# Patient Record
Sex: Female | Born: 1990 | Race: Black or African American | Hispanic: No | Marital: Married | Smoking: Current some day smoker
Health system: Southern US, Community
[De-identification: ages and names within clinical notes are randomized; demographics above are authoritative.]

## PROBLEM LIST (undated history)

## (undated) ENCOUNTER — Inpatient Hospital Stay (HOSPITAL_COMMUNITY): Payer: Self-pay

## (undated) DIAGNOSIS — N390 Urinary tract infection, site not specified: Secondary | ICD-10-CM

## (undated) DIAGNOSIS — B009 Herpesviral infection, unspecified: Secondary | ICD-10-CM

## (undated) DIAGNOSIS — N83209 Unspecified ovarian cyst, unspecified side: Secondary | ICD-10-CM

## (undated) DIAGNOSIS — R87619 Unspecified abnormal cytological findings in specimens from cervix uteri: Secondary | ICD-10-CM

## (undated) DIAGNOSIS — F32A Depression, unspecified: Secondary | ICD-10-CM

## (undated) DIAGNOSIS — IMO0002 Reserved for concepts with insufficient information to code with codable children: Secondary | ICD-10-CM

## (undated) DIAGNOSIS — N739 Female pelvic inflammatory disease, unspecified: Secondary | ICD-10-CM

## (undated) DIAGNOSIS — D649 Anemia, unspecified: Secondary | ICD-10-CM

## (undated) DIAGNOSIS — T7840XA Allergy, unspecified, initial encounter: Secondary | ICD-10-CM

## (undated) DIAGNOSIS — O0930 Supervision of pregnancy with insufficient antenatal care, unspecified trimester: Secondary | ICD-10-CM

## (undated) HISTORY — DX: Allergy, unspecified, initial encounter: T78.40XA

## (undated) HISTORY — DX: Supervision of pregnancy with insufficient antenatal care, unspecified trimester: O09.30

---
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
## 2008-12-03 ENCOUNTER — Emergency Department (HOSPITAL_COMMUNITY): Admission: EM | Admit: 2008-12-03 | Discharge: 2008-12-04 | Payer: Self-pay | Admitting: Emergency Medicine

## 2009-11-27 ENCOUNTER — Emergency Department (HOSPITAL_COMMUNITY): Admission: EM | Admit: 2009-11-27 | Discharge: 2009-11-27 | Payer: Self-pay | Admitting: Emergency Medicine

## 2010-01-23 ENCOUNTER — Emergency Department (HOSPITAL_COMMUNITY): Admission: EM | Admit: 2010-01-23 | Discharge: 2010-01-23 | Payer: Self-pay | Admitting: Emergency Medicine

## 2010-02-17 ENCOUNTER — Inpatient Hospital Stay (HOSPITAL_COMMUNITY): Admission: AD | Admit: 2010-02-17 | Discharge: 2010-02-17 | Payer: Self-pay | Admitting: Family Medicine

## 2010-02-17 ENCOUNTER — Ambulatory Visit: Payer: Self-pay | Admitting: Nurse Practitioner

## 2010-05-17 ENCOUNTER — Encounter: Payer: Self-pay | Admitting: Family Medicine

## 2010-07-08 LAB — URINALYSIS, ROUTINE W REFLEX MICROSCOPIC
Ketones, ur: NEGATIVE mg/dL
Leukocytes, UA: NEGATIVE
Protein, ur: NEGATIVE mg/dL
Specific Gravity, Urine: 1.015 (ref 1.005–1.030)

## 2010-07-08 LAB — CBC
HCT: 39.2 % (ref 36.0–46.0)
MCH: 28.2 pg (ref 26.0–34.0)
MCV: 84.6 fL (ref 78.0–100.0)
Platelets: 283 10*3/uL (ref 150–400)
RBC: 4.63 MIL/uL (ref 3.87–5.11)
WBC: 6.7 10*3/uL (ref 4.0–10.5)

## 2010-07-08 LAB — WET PREP, GENITAL
Trich, Wet Prep: NONE SEEN
Yeast Wet Prep HPF POC: NONE SEEN

## 2010-07-08 LAB — URINE MICROSCOPIC-ADD ON

## 2010-07-08 LAB — GC/CHLAMYDIA PROBE AMP, GENITAL: GC Probe Amp, Genital: NEGATIVE

## 2010-07-08 LAB — POCT PREGNANCY, URINE: Preg Test, Ur: NEGATIVE

## 2010-07-09 LAB — POCT I-STAT, CHEM 8
BUN: 8 mg/dL (ref 6–23)
Calcium, Ion: 1.18 mmol/L (ref 1.12–1.32)
Chloride: 107 mEq/L (ref 96–112)
Glucose, Bld: 95 mg/dL (ref 70–99)
TCO2: 24 mmol/L (ref 0–100)

## 2010-07-10 LAB — COMPREHENSIVE METABOLIC PANEL
ALT: 15 U/L (ref 0–35)
AST: 22 U/L (ref 0–37)
CO2: 22 mEq/L (ref 19–32)
Chloride: 107 mEq/L (ref 96–112)
Creatinine, Ser: 0.82 mg/dL (ref 0.4–1.2)
GFR calc Af Amer: >60 mL/min (ref >60)
GFR calc non Af Amer: >60 mL/min (ref >60)
Glucose, Bld: 78 mg/dL (ref 70–99)
Total Bilirubin: 0.6 mg/dL (ref 0.3–1.2)

## 2010-07-10 LAB — DIFFERENTIAL
Basophils Absolute: 0.1 10*3/uL (ref 0.0–0.1)
Lymphs Abs: 2.6 10*3/uL (ref 0.7–4.0)
Monocytes Absolute: 0.7 10*3/uL (ref 0.1–1.0)
Monocytes Relative: 9 % (ref 3–12)
Neutrophils Relative %: 53 % (ref 43–77)

## 2010-07-10 LAB — URINALYSIS, ROUTINE W REFLEX MICROSCOPIC
Bilirubin Urine: NEGATIVE
Glucose, UA: NEGATIVE mg/dL
Ketones, ur: NEGATIVE mg/dL
Specific Gravity, Urine: 1.014 (ref 1.005–1.030)
pH: 6 (ref 5.0–8.0)

## 2010-07-10 LAB — URINE CULTURE: Colony Count: >=100000

## 2010-07-10 LAB — URINE MICROSCOPIC-ADD ON

## 2010-07-10 LAB — CBC
HCT: 44.1 % (ref 36.0–46.0)
Hemoglobin: 15.1 g/dL — ABNORMAL HIGH (ref 12.0–15.0)
MCH: 27.3 pg (ref 26.0–34.0)
RBC: 5.54 MIL/uL — ABNORMAL HIGH (ref 3.87–5.11)

## 2010-07-10 LAB — LIPASE, BLOOD: Lipase: 36 U/L (ref 11–59)

## 2010-07-10 LAB — POCT PREGNANCY, URINE: Preg Test, Ur: NEGATIVE

## 2010-07-15 ENCOUNTER — Inpatient Hospital Stay (HOSPITAL_COMMUNITY): Payer: Self-pay

## 2010-07-15 ENCOUNTER — Inpatient Hospital Stay (HOSPITAL_COMMUNITY)
Admission: EM | Admit: 2010-07-15 | Discharge: 2010-07-15 | Disposition: A | Discharge status: Home / Self Care | Payer: Self-pay | Source: Ambulatory Visit | Attending: Obstetrics & Gynecology | Admitting: Obstetrics & Gynecology

## 2010-07-15 DIAGNOSIS — R109 Unspecified abdominal pain: Secondary | ICD-10-CM | POA: Insufficient documentation

## 2010-07-15 DIAGNOSIS — N760 Acute vaginitis: Secondary | ICD-10-CM | POA: Insufficient documentation

## 2010-07-15 DIAGNOSIS — N949 Unspecified condition associated with female genital organs and menstrual cycle: Secondary | ICD-10-CM | POA: Insufficient documentation

## 2010-07-15 DIAGNOSIS — B9689 Other specified bacterial agents as the cause of diseases classified elsewhere: Secondary | ICD-10-CM | POA: Insufficient documentation

## 2010-07-15 DIAGNOSIS — A499 Bacterial infection, unspecified: Secondary | ICD-10-CM

## 2010-07-15 LAB — CBC
HCT: 37.6 % (ref 36.0–46.0)
Hemoglobin: 12.6 g/dL (ref 12.0–15.0)
MCHC: 33.5 g/dL (ref 30.0–36.0)
RBC: 4.7 MIL/uL (ref 3.87–5.11)

## 2010-07-15 LAB — URINE MICROSCOPIC-ADD ON

## 2010-07-15 LAB — URINALYSIS, ROUTINE W REFLEX MICROSCOPIC
Bilirubin Urine: NEGATIVE
Glucose, UA: NEGATIVE mg/dL
Hgb urine dipstick: NEGATIVE
Protein, ur: NEGATIVE mg/dL
Urobilinogen, UA: 0.2 mg/dL (ref 0.0–1.0)

## 2010-07-15 LAB — WET PREP, GENITAL
Trich, Wet Prep: NONE SEEN
Yeast Wet Prep HPF POC: NONE SEEN

## 2010-07-15 LAB — DIFFERENTIAL
Basophils Absolute: 0.1 10*3/uL (ref 0.0–0.1)
Basophils Relative: 1 % (ref 0–1)
Lymphocytes Relative: 47 % — ABNORMAL HIGH (ref 12–46)
Monocytes Absolute: 0.6 10*3/uL (ref 0.1–1.0)
Monocytes Relative: 8 % (ref 3–12)
Neutro Abs: 2.8 10*3/uL (ref 1.7–7.7)
Neutrophils Relative %: 41 % — ABNORMAL LOW (ref 43–77)

## 2010-07-16 LAB — GC/CHLAMYDIA PROBE AMP, GENITAL
Chlamydia, DNA Probe: NEGATIVE
GC Probe Amp, Genital: POSITIVE — AB

## 2010-08-01 LAB — DIFFERENTIAL
Eosinophils Absolute: 0.3 10*3/uL (ref 0.0–0.7)
Lymphs Abs: 2 10*3/uL (ref 0.7–4.0)
Monocytes Absolute: 0.6 10*3/uL (ref 0.1–1.0)
Monocytes Relative: 8 % (ref 3–12)
Neutrophils Relative %: 61 % (ref 43–77)

## 2010-08-01 LAB — URINALYSIS, ROUTINE W REFLEX MICROSCOPIC
Bilirubin Urine: NEGATIVE
Hgb urine dipstick: NEGATIVE
Ketones, ur: NEGATIVE mg/dL
Nitrite: NEGATIVE
Protein, ur: NEGATIVE mg/dL
Specific Gravity, Urine: 1.013 (ref 1.005–1.030)
Urobilinogen, UA: 0.2 mg/dL (ref 0.0–1.0)

## 2010-08-01 LAB — BASIC METABOLIC PANEL
CO2: 28 mEq/L (ref 19–32)
Chloride: 108 mEq/L (ref 96–112)
Creatinine, Ser: 0.82 mg/dL (ref 0.4–1.2)
GFR calc Af Amer: >60 mL/min (ref >60)
Potassium: 4.1 mEq/L (ref 3.5–5.1)
Sodium: 141 mEq/L (ref 135–145)

## 2010-08-01 LAB — WET PREP, GENITAL
Clue Cells Wet Prep HPF POC: NONE SEEN
WBC, Wet Prep HPF POC: NONE SEEN
Yeast Wet Prep HPF POC: NONE SEEN

## 2010-08-01 LAB — CBC
Hemoglobin: 13.5 g/dL (ref 12.0–15.0)
MCHC: 33.3 g/dL (ref 30.0–36.0)
MCV: 84.1 fL (ref 78.0–100.0)
RBC: 4.82 MIL/uL (ref 3.87–5.11)
WBC: 7.5 10*3/uL (ref 4.0–10.5)

## 2010-08-01 LAB — POCT PREGNANCY, URINE: Preg Test, Ur: NEGATIVE

## 2010-08-01 LAB — URINE MICROSCOPIC-ADD ON

## 2010-08-01 LAB — GC/CHLAMYDIA PROBE AMP, GENITAL: Chlamydia, DNA Probe: NEGATIVE

## 2010-09-08 NOTE — H&P (Signed)
NAMEJONAH, NESTLE            ACCOUNT NO.:  [id number]   MEDICAL RECORD NO.:  [id number]          PATIENT TYPE:  MAT   LOCATION:  [facility]                          FACILITY:  [facility]   PHYSICIAN:  Lenoard Aden, M.D.DATE OF BIRTH:  [birth year]   DATE OF ADMISSION:  10/23/2007  DATE OF DISCHARGE:  10/23/2007                              HISTORY & PHYSICAL   CHIEF COMPLAINT:  Bleeding.   HISTORY OF PRESENT ILLNESS:  She is an 20 year old African American  female with a history of monochorionic diamniotic gestation documented  by ultrasound with sudden onset of bright red bleeding this evening.  She denies any tissue passage at this time.  She has no known drug  allergies.   ALLERGIES:  LATEX.   She has a history of UTI for which she did not treat but take her  Macrobid.   FAMILY HISTORY:  Diabetes and hypertension.   PREGNANCY HISTORY:  Noncontributory.   SOCIAL HISTORY:  She smokes a pack a day.  She denies domestic or  physical violence.   PHYSICAL EXAMINATION:  GENERAL:  She is a well-developed, well-nourished  [place] female in no acute distress.  HEENT:  Normal.  LUNGS:  Clear.  HEART:  Regular rate and rhythm.  ABDOMEN:  Soft and nontender.  PELVIC:  Reveals cervix to be closed and long.  No active bleeding  noted.  Uterus is enlarged.  It is about 8-10 weeks' size.  No adnexal  masses are noted.   Ultrasound revealed a 7-week intrauterine diamniotic monochorionic  pregnancy with a small subchorionic hematoma and fetal viability x2.   IMPRESSION:  Threatened abortion with viable twin pregnancy as  previously noted.  Blood type Rh +ve.   PLAN:  Discharge to home.  Follow up in the office as scheduled.  Pelvic  precautions given.      Lenoard Aden, M.D.  Electronically Signed    RJT/MEDQ  D:  10/23/2007  T:  10/24/2007  Job:  [id number]

## 2010-09-08 NOTE — Op Note (Signed)
Kristin Kemp, Kristin Kemp            ACCOUNT NO.:  [id number]   MEDICAL RECORD NO.:  [id number]          PATIENT TYPE:  AMB   LOCATION:  [facility]                           FACILITY:  [facility]   PHYSICIAN:  Randye Lobo, M.D.   DATE OF BIRTH:  [birth year]   DATE OF PROCEDURE:  11/22/2007  DATE OF DISCHARGE:                               OPERATIVE REPORT   PREOPERATIVE DIAGNOSIS:  Missed abortion with twin gestation at 25 plus  4 weeks.   POSTOPERATIVE DIAGNOSIS:  Missed abortion with twin gestation at 75 plus  4 weeks.   PROCEDURE:  Dilation and evacuation.   SURGEON:  Randye Lobo, MD   ANESTHESIA:  LMA, paracervical block with 1% lidocaine.   IV FLUIDS:  600 mL Ringer's lactate.   ESTIMATED BLOOD LOSS:  150 mL.   URINE OUTPUT:  50 mL.   COMPLICATIONS:  None.   INDICATIONS FOR PROCEDURE:  The patient is an 20 year old gravida 1,  para 0 African American female at 28 plus [redacted] weeks gestation who  presented to the office for a routine visit on November 21, 2007, at which  time, she was noted to have a missed abortion on ultrasound.  The  patient had previously received her care from another obstetric  practice, at which time, she had had an ultrasound documenting a viable  twin gestation dichorionic, diamniotic with an anticipated [facility] of  June 09, 2008.  Ultrasound performed in the office on November 21, 2007,  documented a missed abortion of both twins, which had a crown rump  length consistent each with 7 plus 5 weeks' gestation.  A discussion was  held with the patient regarding options for care and a plan was made to  proceed with a dilation and evacuation after risks, benefits, and  alternatives were reviewed.   FINDINGS:  Exam under anesthesia revealed a 9-week sized anteverted  mobile uterus.  No adnexal masses were appreciated.  A moderate amount  of products of conception were obtained.   SPECIMENS:  Products of conception were sent to pathology.   PROCEDURE:  The patient  was identified in the preoperative hold area.  She was taken to the operating room where latex allergy precautions were  taken.  The patient did receive an LMA anesthetic and she was then  placed in the dorsal lithotomy position.  The lower abdomen, vagina, and  perineum were sterilely prepped and draped and the bladder was  catheterized of urine.   An exam under anesthesia was performed.   A speculum was placed inside the vagina and a single-tooth tenaculum was  placed on the anterior cervical lip.  A paracervical block was performed  with a total of 10 mL of 1% lidocaine in the usual standard fashion.  The uterus was sounded to 9 cm.  The cervix was then dilated to a #29  Pratt dilator.  A #9 suction tip curette was then inserted through the  cervical os to the level of the uterine fundus and was withdrawn  slightly.  Proper suction was applied and the suction tip curette was  turned in a clockwise  fashion as it was removed from the uterine cavity.  This was repeated an additional two times.  There was some active  bleeding noted from within the uterus during the procedure.  A sharp  curettage was performed and there were no obvious remaining products of  conception appreciated.  The suction tip curette was introduced an  additional two final times to remove blood and clots from within the  uterine cavity.  Hemostasis was excellent at the end of the procedure.   All of the vaginal instruments were removed and the patient was taken  out of the dorsal lithotomy position.  She was escorted to the recovery  room in stable and awake condition.  There were no complications to the  procedure.  All needle, instrument, and sponge counts were correct.      Randye Lobo, M.D.  Electronically Signed     BES/MEDQ  D:  11/22/2007  T:  11/23/2007  Job:  [id number]

## 2010-10-24 ENCOUNTER — Emergency Department (HOSPITAL_COMMUNITY)
Admission: EM | Admit: 2010-10-24 | Discharge: 2010-10-24 | Disposition: A | Discharge status: Home / Self Care | Payer: Medicaid Other | Attending: Emergency Medicine | Admitting: Emergency Medicine

## 2010-10-24 DIAGNOSIS — L2989 Other pruritus: Secondary | ICD-10-CM | POA: Insufficient documentation

## 2010-10-24 DIAGNOSIS — L298 Other pruritus: Secondary | ICD-10-CM | POA: Insufficient documentation

## 2010-10-24 DIAGNOSIS — M25529 Pain in unspecified elbow: Secondary | ICD-10-CM | POA: Insufficient documentation

## 2010-10-24 DIAGNOSIS — IMO0002 Reserved for concepts with insufficient information to code with codable children: Secondary | ICD-10-CM | POA: Insufficient documentation

## 2010-10-24 DIAGNOSIS — M25429 Effusion, unspecified elbow: Secondary | ICD-10-CM | POA: Insufficient documentation

## 2010-10-27 ENCOUNTER — Inpatient Hospital Stay (HOSPITAL_COMMUNITY): Payer: Medicaid Other

## 2010-10-27 ENCOUNTER — Inpatient Hospital Stay (HOSPITAL_COMMUNITY)
Admission: AD | Admit: 2010-10-27 | Discharge: 2010-10-27 | Disposition: A | Discharge status: Home / Self Care | Payer: Medicaid Other | Source: Ambulatory Visit | Attending: Obstetrics and Gynecology | Admitting: Obstetrics and Gynecology

## 2010-10-27 DIAGNOSIS — O2390 Unspecified genitourinary tract infection in pregnancy, unspecified trimester: Secondary | ICD-10-CM | POA: Insufficient documentation

## 2010-10-27 DIAGNOSIS — A499 Bacterial infection, unspecified: Secondary | ICD-10-CM

## 2010-10-27 DIAGNOSIS — N760 Acute vaginitis: Secondary | ICD-10-CM | POA: Insufficient documentation

## 2010-10-27 DIAGNOSIS — R109 Unspecified abdominal pain: Secondary | ICD-10-CM

## 2010-10-27 DIAGNOSIS — R52 Pain, unspecified: Secondary | ICD-10-CM

## 2010-10-27 DIAGNOSIS — B9689 Other specified bacterial agents as the cause of diseases classified elsewhere: Secondary | ICD-10-CM | POA: Insufficient documentation

## 2010-10-27 LAB — URINALYSIS, ROUTINE W REFLEX MICROSCOPIC
Bilirubin Urine: NEGATIVE
Hgb urine dipstick: NEGATIVE
Nitrite: NEGATIVE
Protein, ur: NEGATIVE mg/dL
Urobilinogen, UA: 0.2 mg/dL (ref 0.0–1.0)

## 2010-10-27 LAB — CBC
MCH: 27.5 pg (ref 26.0–34.0)
MCV: 81.3 fL (ref 78.0–100.0)
Platelets: 273 10*3/uL (ref 150–400)
RDW: 14.3 % (ref 11.5–15.5)

## 2010-10-27 LAB — POCT PREGNANCY, URINE: Preg Test, Ur: POSITIVE

## 2010-10-27 LAB — WET PREP, GENITAL: Trich, Wet Prep: NONE SEEN

## 2010-11-04 ENCOUNTER — Encounter (HOSPITAL_COMMUNITY): Payer: Self-pay | Admitting: *Deleted

## 2010-11-04 ENCOUNTER — Inpatient Hospital Stay (HOSPITAL_COMMUNITY): Payer: Medicaid Other

## 2010-11-04 ENCOUNTER — Inpatient Hospital Stay (HOSPITAL_COMMUNITY)
Admission: AD | Admit: 2010-11-04 | Discharge: 2010-11-04 | Disposition: A | Discharge status: Home / Self Care | Payer: Medicaid Other | Source: Ambulatory Visit | Attending: Obstetrics & Gynecology | Admitting: Obstetrics & Gynecology

## 2010-11-04 DIAGNOSIS — O4690 Antepartum hemorrhage, unspecified, unspecified trimester: Secondary | ICD-10-CM

## 2010-11-04 DIAGNOSIS — O98219 Gonorrhea complicating pregnancy, unspecified trimester: Secondary | ICD-10-CM | POA: Insufficient documentation

## 2010-11-04 DIAGNOSIS — A5400 Gonococcal infection of lower genitourinary tract, unspecified: Secondary | ICD-10-CM | POA: Insufficient documentation

## 2010-11-04 DIAGNOSIS — A5403 Gonococcal cervicitis, unspecified: Secondary | ICD-10-CM

## 2010-11-04 DIAGNOSIS — O209 Hemorrhage in early pregnancy, unspecified: Secondary | ICD-10-CM

## 2010-11-04 LAB — CBC
HCT: 36 % (ref 36.0–46.0)
Hemoglobin: 12.4 g/dL (ref 12.0–15.0)
MCHC: 34.4 g/dL (ref 30.0–36.0)
MCV: 79.6 fL (ref 78.0–100.0)
RDW: 14 % (ref 11.5–15.5)
WBC: 9.6 10*3/uL (ref 4.0–10.5)

## 2010-11-04 MED ORDER — CEFTRIAXONE SODIUM 250 MG IJ SOLR
250.0000 mg | Freq: Once | INTRAMUSCULAR | Status: AC
  Administered 2010-11-04: 250 mg via INTRAMUSCULAR
  Filled 2010-11-04: qty 250

## 2010-11-04 MED ORDER — LIDOCAINE HCL 1 % IJ SOLN: 250.0000 mg | Freq: Once | INTRAMUSCULAR | Status: DC

## 2010-11-04 NOTE — ED Provider Notes (Signed)
History     Chief Complaint  Patient presents with  . Vaginal Bleeding   HPI 20 yo G2P0010 @ 6.[redacted] wks EGA. Reports onset of vaginal bleeding about 1 hour prior to arrival at [facility]U. Woke up feeling wet, went to bathroom and passed golf ball sized clot. Since then has noticed blood only with wiping. No pain. Was evaluated in MAU on 7/1 for cramping. Quant 5600 at that time, U/S showed IUGS with yolk sac, measured [redacted]w[redacted]d.   OB History    Grav Para Term Preterm Abortions TAB SAB Ect Mult Living   2    1  1   0 0      Past Medical History  Diagnosis Date  . No pertinent past medical history     Past Surgical History  Procedure Date  . Dilation and curettage of uterus 2009    Family History  Problem Relation Age of Onset  . Hypertension Maternal Grandmother   . Hyperlipidemia Maternal Grandmother     History  Substance Use Topics  . Smoking status: Former Games developer  . Smokeless tobacco: Not on file  . Alcohol Use: No    Allergies: No Known Allergies  No prescriptions prior to admission    Review of Systems  Constitutional: Negative.   Respiratory: Negative.   Cardiovascular: Negative.   Genitourinary:       + vaginal bleeding, negative cramping  Musculoskeletal: Negative.   Neurological: Negative.   Psychiatric/Behavioral: Negative.    Physical Exam   Blood pressure 121/67, pulse 85, temperature 98.6 F (37 C), temperature source Oral, resp. rate 18.  Physical Exam  Constitutional: She appears well-developed and well-nourished. She appears distressed (tearful).  Cardiovascular: Normal rate.   Respiratory: Effort normal.  GI: Soft. She exhibits no distension. There is no tenderness. There is no rebound and no guarding.  Genitourinary: There is no rash or lesion on the right labia. There is no rash or lesion on the left labia. There is bleeding around the vagina.       Cervix closed, small amount of blood in vagina     MAU Course  Procedures  Care transferred to  [facility], [facility].

## 2010-11-04 NOTE — Progress Notes (Signed)
N. Bascom Levels, CNM at bedside.  Sterile speculum exam done.  Small bleeding noted.

## 2010-11-04 NOTE — Progress Notes (Signed)
Pt sleeping waiting for u/s results.

## 2010-11-04 NOTE — ED Provider Notes (Signed)
History     Chief Complaint  Patient presents with  . Vaginal Bleeding   HPI  OB History    Grav Para Term Preterm Abortions TAB SAB Ect Mult Living   2    1  1   0 0      Past Medical History  Diagnosis Date  . No pertinent past medical history     Past Surgical History  Procedure Date  . Dilation and curettage of uterus 2009    Family History  Problem Relation Age of Onset  . Hypertension Maternal Grandmother   . Hyperlipidemia Maternal Grandmother     History  Substance Use Topics  . Smoking status: Former Games developer  . Smokeless tobacco: Not on file  . Alcohol Use: No    Allergies: No Known Allergies  No prescriptions prior to admission    ROS Physical Exam   Blood pressure 121/67, pulse 85, temperature 98.6 F (37 C), temperature source Oral, resp. rate 18.  Physical Exam  MAU Course  Procedures  Care transferred to me from [facility] CNM.   Pt H&P performed by CNM.  US shows single living IUP at 5.5wks. Small subchorionic hemorrhage noted.  Pt is Rh positive per note from 09/23/10.  A/P: 1) Vag bleeding in pregnancy: discussed with pt at length. Discussed diet, activity, risks, and precautions. She will f/u with her PCP. 2) Gonorrhea: discussed with pt at length. Will tx with Rocephin prior to dc. Discussed safe sex practices. Henrietta Hoover, PA 11/04/10 0822  Henrietta Hoover, PA 11/04/10 [phone]

## 2010-11-04 NOTE — Progress Notes (Signed)
DENIES MRSA 

## 2010-11-04 NOTE — Progress Notes (Signed)
PT SAYS SHE WAS HERE LAST Tuesday- TOLD HER SHE WAS PREG.  THEN  AT  0530  - PT HAD GOTTEN OFF WORK- WAS LAYING DOWN AND THOUGHT SHE HAD VOIDED ON SELF- BUT WENT TO B-ROOM- AND SAW RED BLOOD IN PANTIES AND PASSED RED CLOT INTO TOILET- GOLF BALL SIZE.Marland Kitchen  THEN WENT TO B-ROOM AGAIN- NO BLOOD.  THEN WENT AGAIN AND SAW SOME BLOOD WHEN WIPED.  SAYS NOW HAS TOILET PAPER IN PANTIES.   SAYS MIDDLE OF ABD HURTS

## 2010-11-04 NOTE — Initial Assessments (Signed)
Pt presents to mau for bleeding that started around 0530.  States she was seen earlier this week for cramping and found out she was pregnant.  States she had felt some cramping tonight before bleeding started.

## 2010-11-04 NOTE — ED Notes (Signed)
N. Frazier, CNM at bedside.  Assessment done and poc discussed with pt.  

## 2010-11-18 ENCOUNTER — Inpatient Hospital Stay (HOSPITAL_COMMUNITY)
Admission: AD | Admit: 2010-11-18 | Discharge: 2010-11-18 | Disposition: A | Discharge status: Home / Self Care | Payer: Medicaid Other | Source: Ambulatory Visit | Attending: Obstetrics and Gynecology | Admitting: Obstetrics and Gynecology

## 2010-11-18 ENCOUNTER — Encounter (HOSPITAL_COMMUNITY): Payer: Self-pay

## 2010-11-18 ENCOUNTER — Encounter (HOSPITAL_COMMUNITY): Payer: Self-pay | Admitting: *Deleted

## 2010-11-18 DIAGNOSIS — B3731 Acute candidiasis of vulva and vagina: Secondary | ICD-10-CM | POA: Insufficient documentation

## 2010-11-18 DIAGNOSIS — O2390 Unspecified genitourinary tract infection in pregnancy, unspecified trimester: Secondary | ICD-10-CM | POA: Insufficient documentation

## 2010-11-18 DIAGNOSIS — O209 Hemorrhage in early pregnancy, unspecified: Secondary | ICD-10-CM | POA: Insufficient documentation

## 2010-11-18 DIAGNOSIS — O4690 Antepartum hemorrhage, unspecified, unspecified trimester: Secondary | ICD-10-CM

## 2010-11-18 DIAGNOSIS — B373 Candidiasis of vulva and vagina: Secondary | ICD-10-CM

## 2010-11-18 LAB — WET PREP, GENITAL: Clue Cells Wet Prep HPF POC: NONE SEEN

## 2010-11-18 LAB — URINALYSIS, ROUTINE W REFLEX MICROSCOPIC
Glucose, UA: NEGATIVE mg/dL
Hgb urine dipstick: NEGATIVE
Leukocytes, UA: NEGATIVE
Protein, ur: NEGATIVE mg/dL
pH: 6 (ref 5.0–8.0)

## 2010-11-18 MED ORDER — FLUCONAZOLE 150 MG PO TABS: 150.0000 mg | ORAL_TABLET | Freq: Once | ORAL | Status: AC

## 2010-11-18 NOTE — Progress Notes (Signed)
Pt states spotting began 2 days ago, cramping began at same time. No recent intercourse. Pt states vaginal d/c "watery/clear, mixed with blood spots". Pt states lower abd cramping is bilateral, but felt moreso on left side.

## 2010-11-18 NOTE — ED Provider Notes (Addendum)
History    Patient is 20 year old black female. She is a gravida 2 para 0 Ab1. She's currently a weeks pregnant. She resents today complaining of vaginal spotting that began yesterday. She's had some mild abdominal cramping but nothing severe. She has not had intercourse since she was treated for gonorrhea. Denies vaginal irritation. She denies fever or any other problems.  Chief Complaint  Patient presents with  . Abdominal Cramping   HPI  OB History    Grav Para Term Preterm Abortions TAB SAB Ect Mult Living   2    1  1   0 0      Past Medical History  Diagnosis Date  . No pertinent past medical history     Past Surgical History  Procedure Date  . Dilation and curettage of uterus 2009    Family History  Problem Relation Age of Onset  . Hypertension Maternal Grandmother   . Hyperlipidemia Maternal Grandmother     History  Substance Use Topics  . Smoking status: Former Games developer  . Smokeless tobacco: Never Used  . Alcohol Use: No    Allergies: No Known Allergies  Prescriptions prior to admission  Medication Sig Dispense Refill  . promethazine (PHENERGAN) 25 MG tablet Take 25 mg by mouth every 6 (six) hours as needed. Take 1/2 to 1 tab every 6 hours as needed for nausea and vomiting         Review of Systems  Constitutional: Negative for fever and chills.  Eyes: Negative for blurred vision.  Cardiovascular: Negative for chest pain and palpitations.  Gastrointestinal: Positive for abdominal pain. Negative for nausea, vomiting, diarrhea and constipation.  Neurological: Negative for dizziness and headaches.  Psychiatric/Behavioral: Negative for depression and suicidal ideas.   Physical Exam   Blood pressure 114/62, pulse 93, temperature 99 F (37.2 C), temperature source Oral, resp. rate 16, height 5\' 4"  (1.626 m), weight 125 lb (56.7 kg), SpO2 99.00%.  Physical Exam  Constitutional: She is oriented to person, place, and time. She appears well-developed and  well-nourished. No distress.  HENT:  Head: Normocephalic and atraumatic.  Eyes: EOM are normal. Pupils are equal, round, and reactive to light.  GI: Soft. She exhibits no distension and no mass. There is no tenderness. There is no rebound and no guarding.  Genitourinary: Vaginal discharge found.       There is vaginal discharge present in the vaginal vault. It appears to be old blood. There is no active bleeding noted.  Neurological: She is alert and oriented to person, place, and time.  Skin: Skin is warm and dry. She is not diaphoretic.  Psychiatric: She has a normal mood and affect. Her behavior is normal. Judgment and thought content normal.    MAU Course  Procedures  Bedside ultrasound was performed. It demonstrated a single intrauterine pregnancy with good cardiac activity. No adenopathy is present at this time.   Results for orders placed during the hospital encounter of 11/18/10 (from the past 24 hour(s))  URINALYSIS, ROUTINE W REFLEX MICROSCOPIC     Status: Normal   Collection Time   11/18/10  3:30 PM      Component Value Range   Color, Urine YELLOW  YELLOW    Appearance CLEAR  CLEAR    Specific Gravity, Urine 1.020  1.005 - 1.030    pH 6.0  5.0 - 8.0    Glucose, UA NEGATIVE  NEGATIVE (mg/dL)   Hgb urine dipstick NEGATIVE  NEGATIVE    Bilirubin Urine NEGATIVE  NEGATIVE    Ketones, ur NEGATIVE  NEGATIVE (mg/dL)   Protein, ur NEGATIVE  NEGATIVE (mg/dL)   Urobilinogen, UA 0.2  0.0 - 1.0 (mg/dL)   Nitrite NEGATIVE  NEGATIVE    Leukocytes, UA NEGATIVE  NEGATIVE   WET PREP, GENITAL     Status: Abnormal   Collection Time   11/18/10  6:34 PM      Component Value Range   Yeast, Wet Prep MANY (*) NONE SEEN    Trich, Wet Prep NONE SEEN  NONE SEEN    Clue Cells, Wet Prep NONE SEEN  NONE SEEN    WBC, Wet Prep HPF POC FEW (*) NONE SEEN    Patient blood type is A+. Assessment and Plan  Vaginal bleeding and pregnancy: Did discuss this with the patient in length. I did give her  cautious. She was also noted to have a vaginal yeast infection. I will give her prescription for Diflucan. Followup scheduled with her OB provider. Did discuss with appropriate diet, activities, risks and precautions. She understood this and agreed.  Clinton Gallant. Rice III, DrHSc, MPAS, PA-C  11/18/2010, 6:28 PM

## 2010-11-18 NOTE — Progress Notes (Signed)
Pt states she was seen at MAU 2 weeks ago for pregnancy confirmation. Has been cramping for 2 days, with spotting with wiping and sharp abdominal pain since yesterday

## 2010-11-19 LAB — GC/CHLAMYDIA PROBE AMP, GENITAL: GC Probe Amp, Genital: NEGATIVE

## 2010-12-14 ENCOUNTER — Inpatient Hospital Stay (HOSPITAL_COMMUNITY)
Admission: AD | Admit: 2010-12-14 | Discharge: 2010-12-14 | Discharge status: Against Medical Advice | Payer: Self-pay | Source: Ambulatory Visit | Attending: Obstetrics & Gynecology | Admitting: Obstetrics & Gynecology

## 2010-12-14 DIAGNOSIS — O99891 Other specified diseases and conditions complicating pregnancy: Secondary | ICD-10-CM | POA: Insufficient documentation

## 2010-12-14 DIAGNOSIS — R51 Headache: Secondary | ICD-10-CM | POA: Insufficient documentation

## 2010-12-14 DIAGNOSIS — H538 Other visual disturbances: Secondary | ICD-10-CM | POA: Insufficient documentation

## 2010-12-14 LAB — URINALYSIS, ROUTINE W REFLEX MICROSCOPIC
Bilirubin Urine: NEGATIVE
Glucose, UA: NEGATIVE mg/dL
Hgb urine dipstick: NEGATIVE
Ketones, ur: NEGATIVE mg/dL
Protein, ur: NEGATIVE mg/dL
Urobilinogen, UA: 0.2 mg/dL (ref 0.0–1.0)

## 2010-12-14 NOTE — Progress Notes (Signed)
Throwing up Sat night, red spot in eye when woke up.  Has headache since and vision has been blurred.

## 2010-12-25 ENCOUNTER — Inpatient Hospital Stay (HOSPITAL_COMMUNITY)
Admission: AD | Admit: 2010-12-25 | Discharge: 2010-12-25 | Discharge status: Against Medical Advice | Payer: Medicaid Other | Source: Ambulatory Visit | Attending: Obstetrics and Gynecology | Admitting: Obstetrics and Gynecology

## 2010-12-25 DIAGNOSIS — J069 Acute upper respiratory infection, unspecified: Secondary | ICD-10-CM | POA: Insufficient documentation

## 2010-12-25 DIAGNOSIS — R05 Cough: Secondary | ICD-10-CM | POA: Insufficient documentation

## 2010-12-25 DIAGNOSIS — R059 Cough, unspecified: Secondary | ICD-10-CM | POA: Insufficient documentation

## 2010-12-25 NOTE — Progress Notes (Signed)
Pt states, " One Tuesday I started with a real bad cough and couldn't breath out of my nose. I am still coughing and spit up brown-yellow mucus."

## 2010-12-26 ENCOUNTER — Inpatient Hospital Stay (HOSPITAL_COMMUNITY)
Admission: AD | Admit: 2010-12-26 | Discharge: 2010-12-26 | Disposition: A | Discharge status: Home / Self Care | Payer: Self-pay | Source: Ambulatory Visit | Attending: Obstetrics & Gynecology | Admitting: Obstetrics & Gynecology

## 2010-12-26 DIAGNOSIS — J069 Acute upper respiratory infection, unspecified: Secondary | ICD-10-CM | POA: Insufficient documentation

## 2010-12-26 DIAGNOSIS — O99891 Other specified diseases and conditions complicating pregnancy: Secondary | ICD-10-CM | POA: Insufficient documentation

## 2010-12-26 NOTE — ED Provider Notes (Signed)
Attestation of Attending Supervision of Advanced Practitioner: Evaluation and management procedures were performed by the PA/NP/CNM/OB Fellow under my supervision/collaboration. Chart reviewed and agree with management and plan.  Victorine Mcnee A 12/26/2010 11:30 PM   

## 2010-12-26 NOTE — ED Provider Notes (Signed)
Chief Complaint:  Nasal Congestion   Kristin Kemp is  20 y.o. G2P0010.  No LMP recorded. Patient is pregnant..  Her pregnancy status is positive.  She presents complaining of Nasal Congestion . Onset is described as gradual and has been present for  4 days. Beginning to feel better compared with yesterday  OB History    Grav Para Term Preterm Abortions TAB SAB Ect Mult Living   2    1  1   0 0       Past Medical History  Diagnosis Date  . No pertinent past medical history     Past Surgical History  Procedure Date  . Dilation and curettage of uterus 2009    Family History  Problem Relation Age of Onset  . Hypertension Maternal Grandmother   . Hyperlipidemia Maternal Grandmother     History  Substance Use Topics  . Smoking status: Former Games developer  . Smokeless tobacco: Never Used  . Alcohol Use: No    Allergies: No Known Allergies  Prescriptions prior to admission  Medication Sig Dispense Refill  . Phenyleph-CPM-DM-APAP (TYLENOL COLD HEAD CONGESTION PO) Take 1 tablet by mouth as needed. For cold symptoms       . prenatal vitamin w/FE, FA (PRENATAL 1 + 1) 27-1 MG TABS Take 1 tablet by mouth daily.        . promethazine (PHENERGAN) 25 MG tablet Take 25 mg by mouth every 6 (six) hours as needed. Take 1/2 to 1 tab every 6 hours as needed for nausea and vomiting         Review of Systems - Negative except nasal and ear congestion,  Non-productive cough   Physical Exam   Blood pressure 113/70, pulse 81, temperature 98.6 F (37 C), temperature source Oral, resp. rate 18, height 5' 3.25" (1.607 m), weight 55.43 kg (122 lb 3.2 oz).  General: General appearance - alert, well appearing, and in no distress and sniffing, occ cough Chest - clear to auscultation, no wheezes, rales or rhonchi, symmetric air entry Heart - normal rate and regular rhythm Focused Gynecological Exam: examination not indicated  Labs: No results found for this or any previous visit (from the past  24 hour(s)). Imaging Studies:  No results found.   Assessment: Probable viral URI  Plan: If symptoms are not gone 10 days from onset, consider ABX  CRESENZO-DISHMAN,Tomoki Lucken

## 2010-12-26 NOTE — Progress Notes (Signed)
Onset of cold since Tuesday does not feel any better, has been in seen in MAU for this pregnancy, no prenatal care.

## 2010-12-30 NOTE — ED Provider Notes (Signed)
History     Chief Complaint  Patient presents with  . Cough  . URI   HPI    Past Medical History  Diagnosis Date  . No pertinent past medical history     Past Surgical History  Procedure Date  . Dilation and curettage of uterus 2009    Family History  Problem Relation Age of Onset  . Hypertension Maternal Grandmother   . Hyperlipidemia Maternal Grandmother     History  Substance Use Topics  . Smoking status: Former Games developer  . Smokeless tobacco: Never Used  . Alcohol Use: No    Allergies: No Known Allergies  No prescriptions prior to admission    ROS Physical Exam   Blood pressure 104/69, pulse 79, temperature 98.4 F (36.9 C), temperature source Oral, resp. rate 18, height 5' 2.75" (1.594 m), weight 55.963 kg (123 lb 6 oz).  Physical Exam  MAU Course  Procedures  MDM  Assessment and Plan  Patient left without being seen.  Wynelle Bourgeois 12/30/2010, 9:01 PM

## 2011-01-11 ENCOUNTER — Inpatient Hospital Stay (HOSPITAL_COMMUNITY)
Admission: AD | Admit: 2011-01-11 | Discharge: 2011-01-11 | Disposition: A | Discharge status: Home / Self Care | Payer: Medicaid Other | Source: Ambulatory Visit | Attending: Obstetrics & Gynecology | Admitting: Obstetrics & Gynecology

## 2011-01-11 DIAGNOSIS — O99891 Other specified diseases and conditions complicating pregnancy: Secondary | ICD-10-CM | POA: Insufficient documentation

## 2011-01-11 DIAGNOSIS — Z331 Pregnant state, incidental: Secondary | ICD-10-CM

## 2011-01-11 NOTE — ED Provider Notes (Signed)
Pt states that in her previous preganancy, she had had several ultrasounds by this gestation and thought she needed one.  She has an appt with her OB on Monday.  No c/o.  Pt informed that she should keep her appt with her OB Monday, as we do not do routine ultrasounds in the MAU.  Pt informed that if she has any problems or complaints, she should return.  Pt verbalized an understanding.

## 2011-01-11 NOTE — Progress Notes (Signed)
Pt states she has not had any prenatal care. Has an appointment on Monday at [facility]. Had an early dating ultrasound in MAU at 8 weeks. Is not having any issues but wants to have an ultrasound.

## 2011-01-13 NOTE — ED Provider Notes (Signed)
Agree with above note.  Nissa Stannard H. 01/13/2011 9:40 AM

## 2011-01-19 LAB — URINALYSIS, ROUTINE W REFLEX MICROSCOPIC
Bilirubin Urine: NEGATIVE
Glucose, UA: NEGATIVE
Hgb urine dipstick: NEGATIVE
Specific Gravity, Urine: 1.015
Urobilinogen, UA: 0.2
pH: 6

## 2011-01-19 LAB — GC/CHLAMYDIA PROBE AMP, GENITAL
Chlamydia, DNA Probe: NEGATIVE
GC Probe Amp, Genital: NEGATIVE

## 2011-01-19 LAB — WET PREP, GENITAL: Yeast Wet Prep HPF POC: NONE SEEN

## 2011-01-21 LAB — URINALYSIS, ROUTINE W REFLEX MICROSCOPIC
Bilirubin Urine: NEGATIVE
Ketones, ur: NEGATIVE
Specific Gravity, Urine: 1.015
Urobilinogen, UA: 0.2

## 2011-01-21 LAB — URINE MICROSCOPIC-ADD ON

## 2011-01-22 LAB — CBC
Hemoglobin: 13.1
MCV: 83.4
RBC: 4.74
WBC: 7.2

## 2011-02-11 LAB — RPR: RPR: NONREACTIVE

## 2011-02-11 LAB — HEPATITIS B SURFACE ANTIGEN: Hepatitis B Surface Ag: NEGATIVE

## 2011-02-11 LAB — GC/CHLAMYDIA PROBE AMP, GENITAL: Chlamydia: NEGATIVE

## 2011-02-20 ENCOUNTER — Inpatient Hospital Stay (HOSPITAL_COMMUNITY): Payer: Medicaid Other

## 2011-02-20 ENCOUNTER — Encounter (HOSPITAL_COMMUNITY): Payer: Self-pay | Admitting: *Deleted

## 2011-02-20 ENCOUNTER — Inpatient Hospital Stay (HOSPITAL_COMMUNITY)
Admission: AD | Admit: 2011-02-20 | Discharge: 2011-02-20 | Disposition: A | Discharge status: Home / Self Care | Payer: Medicaid Other | Source: Ambulatory Visit | Attending: Obstetrics and Gynecology | Admitting: Obstetrics and Gynecology

## 2011-02-20 DIAGNOSIS — O99891 Other specified diseases and conditions complicating pregnancy: Secondary | ICD-10-CM | POA: Insufficient documentation

## 2011-02-20 DIAGNOSIS — R1010 Upper abdominal pain, unspecified: Secondary | ICD-10-CM

## 2011-02-20 DIAGNOSIS — R109 Unspecified abdominal pain: Secondary | ICD-10-CM | POA: Insufficient documentation

## 2011-02-20 LAB — COMPREHENSIVE METABOLIC PANEL
ALT: 14 U/L (ref 0–35)
AST: 16 U/L (ref 0–37)
CO2: 23 mEq/L (ref 19–32)
Chloride: 104 mEq/L (ref 96–112)
GFR calc non Af Amer: >90 mL/min (ref >90)
Sodium: 135 mEq/L (ref 135–145)
Total Bilirubin: 0.3 mg/dL (ref 0.3–1.2)

## 2011-02-20 LAB — CBC
HCT: 30.4 % — ABNORMAL LOW (ref 36.0–46.0)
Platelets: 271 10*3/uL (ref 150–400)
RBC: 3.74 MIL/uL — ABNORMAL LOW (ref 3.87–5.11)
RDW: 14.5 % (ref 11.5–15.5)
WBC: 11.3 10*3/uL — ABNORMAL HIGH (ref 4.0–10.5)

## 2011-02-20 LAB — URINALYSIS, ROUTINE W REFLEX MICROSCOPIC
Glucose, UA: NEGATIVE mg/dL
Leukocytes, UA: NEGATIVE
Protein, ur: NEGATIVE mg/dL
Specific Gravity, Urine: 1.01 (ref 1.005–1.030)
pH: 6 (ref 5.0–8.0)

## 2011-02-20 LAB — DIFFERENTIAL
Basophils Absolute: 0 10*3/uL (ref 0.0–0.1)
Lymphocytes Relative: 21 % (ref 12–46)
Monocytes Absolute: 0.9 10*3/uL (ref 0.1–1.0)
Neutro Abs: 8 10*3/uL — ABNORMAL HIGH (ref 1.7–7.7)

## 2011-02-20 NOTE — ED Provider Notes (Signed)
History     Chief Complaint  Patient presents with  . Abdominal Pain   HPILatisha M Kemp.20 y.o. presents with upper abdominal pain. She is crying.  She is now [redacted]w[redacted]d gestation. She is a patient of .  She reports that she woke from a nap at 3:45 and the pain began.  This happened last week, instructed by her doctor to take Zantac pain resolved in 1 day.   States she ate a few chips and water at 4pm and took at Zantac at the same time.  States pain is comes and goes.  Denies nausea and vomiting, fever.       Past Medical History  Diagnosis Date  . No pertinent past medical history     Past Surgical History  Procedure Date  . Dilation and curettage of uterus 2009    Family History  Problem Relation Age of Onset  . Hypertension Maternal Grandmother   . Hyperlipidemia Maternal Grandmother     History  Substance Use Topics  . Smoking status: Former Smoker    Quit date: 10/21/2010  . Smokeless tobacco: Never Used  . Alcohol Use: No    Allergies: No Known Allergies  Prescriptions prior to admission  Medication Sig Dispense Refill  . Phenyleph-CPM-DM-APAP (TYLENOL COLD HEAD CONGESTION PO) Take 1 tablet by mouth as needed. For cold symptoms       . prenatal vitamin w/FE, FA (PRENATAL 1 + 1) 27-1 MG TABS Take 1 tablet by mouth daily.        . promethazine (PHENERGAN) 25 MG tablet Take 25 mg by mouth every 6 (six) hours as needed. Take 1/2 to 1 tab every 6 hours as needed for nausea and vomiting         Review of Systems  Constitutional: Negative for fever and chills.  Gastrointestinal: Positive for abdominal pain (upper mid-right abdominal pain). Negative for nausea and vomiting.   Physical Exam   Blood pressure 117/62, pulse 86, temperature 97.8 F (36.6 C), temperature source Oral, resp. rate 20, height 5\' 3"  (1.6 m).  Physical Exam  Constitutional: She is oriented to person, place, and time. She appears well-developed and well-nourished.       crying  HENT:  Head:  Normocephalic.  Respiratory: Effort normal.  GI: Soft. There is tenderness (mid-right abdominal tenderness). There is no rebound and no guarding.  Neurological: She is alert and oriented to person, place, and time.  Skin: Skin is warm and dry.    Results for orders placed during the hospital encounter of 02/20/11 (from the past 24 hour(s))  CBC     Status: Abnormal   Collection Time   02/20/11  5:37 PM      Component Value Range   WBC 11.3 (*) 4.0 - 10.5 (K/uL)   RBC 3.74 (*) 3.87 - 5.11 (MIL/uL)   Hemoglobin 10.5 (*) 12.0 - 15.0 (g/dL)   HCT 16.1 (*) 09.6 - 46.0 (%)   MCV 81.3  78.0 - 100.0 (fL)   MCH 28.1  26.0 - 34.0 (pg)   MCHC 34.5  30.0 - 36.0 (g/dL)   RDW 04.5  40.9 - 81.1 (%)   Platelets 271  150 - 400 (K/uL)  DIFFERENTIAL     Status: Abnormal   Collection Time   02/20/11  5:37 PM      Component Value Range   Neutrophils Relative 70  43 - 77 (%)   Neutro Abs 8.0 (*) 1.7 - 7.7 (K/uL)   Lymphocytes Relative 21  12 - 46 (%)   Lymphs Abs 2.3  0.7 - 4.0 (K/uL)   Monocytes Relative 8  3 - 12 (%)   Monocytes Absolute 0.9  0.1 - 1.0 (K/uL)   Eosinophils Relative 1  0 - 5 (%)   Eosinophils Absolute 0.1  0.0 - 0.7 (K/uL)   Basophils Relative 0  0 - 1 (%)   Basophils Absolute 0.0  0.0 - 0.1 (K/uL)  COMPREHENSIVE METABOLIC PANEL     Status: Abnormal   Collection Time   02/20/11  5:37 PM      Component Value Range   Sodium 135  135 - 145 (mEq/L)   Potassium 4.0  3.5 - 5.1 (mEq/L)   Chloride 104  96 - 112 (mEq/L)   CO2 23  19 - 32 (mEq/L)   Glucose, Bld 87  70 - 99 (mg/dL)   BUN 8  6 - 23 (mg/dL)   Creatinine, Ser 4.09  0.50 - 1.10 (mg/dL)   Calcium 9.9  8.4 - 81.1 (mg/dL)   Total Protein 6.5  6.0 - 8.3 (g/dL)   Albumin 3.3 (*) 3.5 - 5.2 (g/dL)   AST 16  0 - 37 (U/L)   ALT 14  0 - 35 (U/L)   Alkaline Phosphatase 41  39 - 117 (U/L)   Total Bilirubin 0.3  0.3 - 1.2 (mg/dL)   GFR calc non Af Amer >90  >90 (mL/min)   GFR calc Af Amer >90  >90 (mL/min)  AMYLASE     Status:  Normal   Collection Time   02/20/11  5:37 PM      Component Value Range   Amylase 72  0 - 105 (U/L)  LIPASE, BLOOD     Status: Normal   Collection Time   02/20/11  5:37 PM      Component Value Range   Lipase 31  11 - 59 (U/L)  URINALYSIS, ROUTINE W REFLEX MICROSCOPIC     Status: Normal   Collection Time   02/20/11  5:58 PM      Component Value Range   Color, Urine YELLOW  YELLOW    Appearance CLEAR  CLEAR    Specific Gravity, Urine 1.010  1.005 - 1.030    pH 6.0  5.0 - 8.0    Glucose, UA NEGATIVE  NEGATIVE (mg/dL)   Hgb urine dipstick NEGATIVE  NEGATIVE    Bilirubin Urine NEGATIVE  NEGATIVE    Ketones, ur NEGATIVE  NEGATIVE (mg/dL)   Protein, ur NEGATIVE  NEGATIVE (mg/dL)   Urobilinogen, UA 0.2  0.0 - 1.0 (mg/dL)   Nitrite NEGATIVE  NEGATIVE    Leukocytes, UA NEGATIVE  NEGATIVE         *RADIOLOGY REPORT*  Clinical Data: [redacted] weeks pregnant, RUQ abdominal pain  COMPLETE ABDOMINAL ULTRASOUND  Comparison: CT dated 12/04/2008  Findings:  Gallbladder: No gallstones, gallbladder wall thickening, or  pericholecystic fluid.  Common bile duct: Measures 2 mm.  Liver: No focal lesion identified. Within normal limits in  parenchymal echogenicity.  IVC: Appears normal.  Pancreas: Incompletely visualized but grossly unremarkable.  Spleen: Measures 5.6 cm.  Right Kidney: Measures 10.5 cm. Suspected 4 mm nonobstructing  right interpolar renal calculus. No hydronephrosis.  Left Kidney: Measures 11.0 cm. No mass or hydronephrosis.  Abdominal aorta: No aneurysm identified.  IMPRESSION:  Normal sonographic appearance of the gallbladder.  Suspected 4 mm nonobstructing right interpolar calculus. No  hydronephrosis.  Original Report Authenticated By: Charline Bills, M.D.  MAU Course  Procedures  MDM 17:30  Consulted with Dr. Ambrose Mantle regarding patients sxs /MSE.  Orders given for CBC/Diff, CMP, Amylase, Lipase and Gallbladder ultrasound.  I explained these orders to the  patient.    As ultrasound was taking patient, she reported she had burped several times and was feeling better.   19:15 Went in to evaluate patient after returning from ultrasound.  She is sitting up, smiling, stating she feels much better.  Reported lab and ultrasound results. 19:18  Reported lab and ultrasound results to Dr. Ambrose Mantle.  Patient may be discharged to home.  A:  Upper abdominal pain in second trimester pregnancy    Negative Gall bladder ultrasound  P:  Instructions given to follow up in the office.  Call if symptoms worsen.  She has Zantac at home for prn use.  Instructed to eat small frequent meals, avoid greasy, fried and spicy foods.  Mariette Cowley,EVE M 02/20/2011, 5:19 PM   Matt Holmes, NP 02/20/11 1920

## 2011-02-20 NOTE — Progress Notes (Signed)
Pt states, " After I woke up from a nap at 3:45 pm and had sharp pain all the was across my upper abdomen. The same thing happened one day last week, and my doctor told me to take something for heartburn. I took Zantac and I was still hurting 3-4 hrs latter."

## 2011-03-01 NOTE — ED Provider Notes (Signed)
Agree with above note.  [name] 03/01/2011 11:05 AM   

## 2011-04-27 NOTE — L&D Delivery Note (Signed)
Delivery Note At 6:12 PM a viable female was delivered via  (Presentation: LOA  APGAR: 7,9  Weight 6#8oz .   Placenta status:delivered spontaneously.  Cord:  with the following complications:tight nuchal delivered through .   Anesthesia:  Epidural  Episiotomy: n/a Lacerations: first degree Suture Repair: 3.0 vicryl rapide Est. Blood Loss (mL): 350cc  Mom to postpartum.  Baby to nursery-stable.  Oliver Pila 06/30/2011, 6:23 PM

## 2011-05-31 LAB — STREP B DNA PROBE: GBS: POSITIVE

## 2011-06-10 ENCOUNTER — Inpatient Hospital Stay (HOSPITAL_COMMUNITY)
Admission: AD | Admit: 2011-06-10 | Discharge: 2011-06-10 | Disposition: A | Discharge status: Home / Self Care | Payer: Medicaid Other | Source: Ambulatory Visit | Attending: Obstetrics and Gynecology | Admitting: Obstetrics and Gynecology

## 2011-06-10 ENCOUNTER — Encounter (HOSPITAL_COMMUNITY): Payer: Self-pay | Admitting: *Deleted

## 2011-06-10 DIAGNOSIS — IMO0002 Reserved for concepts with insufficient information to code with codable children: Secondary | ICD-10-CM | POA: Insufficient documentation

## 2011-06-10 DIAGNOSIS — O479 False labor, unspecified: Secondary | ICD-10-CM

## 2011-06-10 DIAGNOSIS — R609 Edema, unspecified: Secondary | ICD-10-CM

## 2011-06-10 DIAGNOSIS — M545 Low back pain, unspecified: Secondary | ICD-10-CM | POA: Insufficient documentation

## 2011-06-10 HISTORY — DX: Unspecified ovarian cyst, unspecified side: N83.209

## 2011-06-10 HISTORY — DX: Reserved for concepts with insufficient information to code with codable children: IMO0002

## 2011-06-10 HISTORY — DX: Herpesviral infection, unspecified: B00.9

## 2011-06-10 HISTORY — DX: Urinary tract infection, site not specified: N39.0

## 2011-06-10 HISTORY — DX: Unspecified abnormal cytological findings in specimens from cervix uteri: R87.619

## 2011-06-10 NOTE — ED Provider Notes (Signed)
History   Pt presents today c/o swelling and pain in her feet along with low back pain that has worsened since yesterday. She is currently 37.[redacted]wk pregnant. She denies abd pain, ctx, blurry vision, RUQ pain, or HA at this time. She reports GFM.  Chief Complaint  Patient presents with  . Back Pain   HPI  OB History    Grav Para Term Preterm Abortions TAB SAB Ect Mult Living   2    1  1   0 0      Past Medical History  Diagnosis Date  . Urinary tract infection   . Ovarian cyst   . Abnormal Pap smear   . Herpes     Past Surgical History  Procedure Date  . Dilation and curettage of uterus 2009    Family History  Problem Relation Age of Onset  . Hypertension Maternal Grandmother   . Hyperlipidemia Maternal Grandmother   . Anesthesia problems Neg Hx     History  Substance Use Topics  . Smoking status: Former Smoker    Quit date: 10/21/2010  . Smokeless tobacco: Never Used  . Alcohol Use: No    Allergies:  Allergies  Allergen Reactions  . Latex Hives    Prescriptions prior to admission  Medication Sig Dispense Refill  . Prenatal Vit-Fe Fumarate-FA (PRENATAL MULTIVITAMIN) TABS Take 1 tablet by mouth daily.      . valACYclovir (VALTREX) 500 MG tablet Take 500 mg by mouth daily.        Review of Systems  Constitutional: Negative for fever and chills.  Eyes: Negative for blurred vision and double vision.  Respiratory: Negative for cough, hemoptysis, sputum production, shortness of breath and wheezing.   Cardiovascular: Negative for chest pain and palpitations.  Gastrointestinal: Negative for nausea, vomiting, abdominal pain, diarrhea and constipation.  Genitourinary: Negative for dysuria, urgency, frequency and hematuria.  Musculoskeletal: Positive for back pain.  Neurological: Negative for dizziness and headaches.  Psychiatric/Behavioral: Negative for depression and suicidal ideas.   Physical Exam   Blood pressure 114/72, pulse 91, temperature 97.7 F (36.5  C), temperature source Oral, resp. rate 16, height 5' 2.5" (1.588 m), weight 167 lb 6.4 oz (75.932 kg), SpO2 100.00%.  Physical Exam  Nursing note and vitals reviewed. Constitutional: She is oriented to person, place, and time. She appears well-developed and well-nourished. No distress.  HENT:  Head: Normocephalic and atraumatic.  Eyes: EOM are normal. Pupils are equal, round, and reactive to light.  GI: Soft. She exhibits no distension. There is no tenderness. There is no rebound and no guarding.  Genitourinary: No bleeding around the vagina. No vaginal discharge found.       Cervix Cl/50/-3.  Musculoskeletal: She exhibits edema (1+ edema to lower extremities bilaterally.).  Neurological: She is alert and oriented to person, place, and time.  Skin: Skin is warm and dry. She is not diaphoretic.  Psychiatric: She has a normal mood and affect. Her behavior is normal. Judgment and thought content normal.    MAU Course  Procedures  NST reactive with irregular ctx.  Assessment and Plan  Edema/low back pain: discussed with pt at length. She has f/u scheduled with Dr. Jackelyn Knife tomorrow. Discussed diet, activity, risks, and precautions. Reminded of FCK. Discussed signs and sx of preeclampsia.  Clinton Gallant. Merlinda Wrubel III, DrHSc, MPAS, PA-C  06/10/2011, 9:53 AM   Henrietta Hoover, PA 06/10/11 1024

## 2011-06-10 NOTE — Progress Notes (Signed)
Patient states she started having mid back pain yesterday. States she has been having swelling in her feet but are worse today and very painful. Reports good fetal movement, no bleeding or leaking. No contractions.

## 2011-06-10 NOTE — Discharge Instructions (Signed)

## 2011-06-11 NOTE — Progress Notes (Signed)
FHT from 2-14 reviewed.  Reactive NST, irregular ctx.

## 2011-06-12 ENCOUNTER — Inpatient Hospital Stay (HOSPITAL_COMMUNITY)
Admission: AD | Admit: 2011-06-12 | Discharge: 2011-06-12 | Disposition: A | Discharge status: Home / Self Care | Payer: Medicaid Other | Source: Ambulatory Visit | Attending: Obstetrics and Gynecology | Admitting: Obstetrics and Gynecology

## 2011-06-12 DIAGNOSIS — O479 False labor, unspecified: Secondary | ICD-10-CM | POA: Insufficient documentation

## 2011-06-12 MED ORDER — ZOLPIDEM TARTRATE 10 MG PO TABS
10.0000 mg | ORAL_TABLET | Freq: Once | ORAL | Status: AC
  Administered 2011-06-12: 10 mg via ORAL
  Filled 2011-06-12: qty 1

## 2011-06-12 NOTE — Progress Notes (Signed)
"  I woke up with pain in my lower back and abd at about 0245.  I didn't time them, but they were coming closely and a lot.  No bleeding or leaking of clear fluid. (+) FM."

## 2011-06-12 NOTE — Progress Notes (Signed)
Pt states, " I woke up having contractions."

## 2011-06-13 ENCOUNTER — Encounter (HOSPITAL_COMMUNITY): Payer: Self-pay | Admitting: *Deleted

## 2011-06-13 ENCOUNTER — Inpatient Hospital Stay (HOSPITAL_COMMUNITY)
Admission: AD | Admit: 2011-06-13 | Discharge: 2011-06-14 | Disposition: A | Discharge status: Home / Self Care | Payer: Medicaid Other | Source: Ambulatory Visit | Attending: Obstetrics and Gynecology | Admitting: Obstetrics and Gynecology

## 2011-06-13 DIAGNOSIS — O479 False labor, unspecified: Secondary | ICD-10-CM | POA: Insufficient documentation

## 2011-06-13 NOTE — Progress Notes (Signed)
Pt presents to mau for labor eval.  States she has been having lower back pain all day along with head ache.  Has not taken anything for it.

## 2011-06-13 NOTE — Discharge Instructions (Signed)
Return to MAU with any worsening symptoms.   °

## 2011-06-13 NOTE — Progress Notes (Signed)
Dr. Ambrose Mantle notified of pt presenting for labor check with head ache and nausea. Notified of vital signs. Notified of VE 1.5/70/-3.  Notified of no bleeding or leaking.  Orders received to dc home.

## 2011-06-21 ENCOUNTER — Encounter (HOSPITAL_COMMUNITY): Payer: Self-pay | Admitting: *Deleted

## 2011-06-21 ENCOUNTER — Telehealth (HOSPITAL_COMMUNITY): Payer: Self-pay | Admitting: *Deleted

## 2011-06-21 NOTE — Telephone Encounter (Signed)
Preadmission screen  

## 2011-06-22 ENCOUNTER — Encounter (HOSPITAL_COMMUNITY): Payer: Self-pay | Admitting: *Deleted

## 2011-06-22 ENCOUNTER — Inpatient Hospital Stay (HOSPITAL_COMMUNITY)
Admission: AD | Admit: 2011-06-22 | Discharge: 2011-06-23 | Disposition: A | Discharge status: Home / Self Care | Payer: Medicaid Other | Source: Ambulatory Visit | Attending: Obstetrics and Gynecology | Admitting: Obstetrics and Gynecology

## 2011-06-22 DIAGNOSIS — O479 False labor, unspecified: Secondary | ICD-10-CM | POA: Insufficient documentation

## 2011-06-22 MED ORDER — LACTATED RINGERS IV BOLUS (SEPSIS)
1000.0000 mL | Freq: Once | INTRAVENOUS | Status: AC
  Administered 2011-06-22: 1000 mL via INTRAVENOUS

## 2011-06-22 NOTE — Progress Notes (Signed)
Dr Ellyn Hack notified of pt complaint, SVE, contraction pattern and b/p results. Will monitor x 1 hour and recheck

## 2011-06-23 MED ORDER — OXYCODONE-ACETAMINOPHEN 5-325 MG PO TABS
1.0000 | ORAL_TABLET | Freq: Once | ORAL | Status: AC
  Administered 2011-06-23: 1 via ORAL
  Filled 2011-06-23: qty 1

## 2011-06-29 ENCOUNTER — Other Ambulatory Visit: Payer: Self-pay | Admitting: Obstetrics and Gynecology

## 2011-06-30 ENCOUNTER — Encounter (HOSPITAL_COMMUNITY): Payer: Self-pay

## 2011-06-30 ENCOUNTER — Inpatient Hospital Stay (HOSPITAL_COMMUNITY)
Admission: RE | Admit: 2011-06-30 | Discharge: 2011-07-02 | DRG: 774 | Disposition: A | Discharge status: Home Health | Payer: Medicaid Other | Source: Ambulatory Visit | Attending: Obstetrics and Gynecology | Admitting: Obstetrics and Gynecology

## 2011-06-30 ENCOUNTER — Encounter (HOSPITAL_COMMUNITY): Payer: Self-pay | Admitting: Anesthesiology

## 2011-06-30 ENCOUNTER — Inpatient Hospital Stay (HOSPITAL_COMMUNITY): Payer: Medicaid Other | Admitting: Anesthesiology

## 2011-06-30 DIAGNOSIS — A630 Anogenital (venereal) warts: Secondary | ICD-10-CM | POA: Diagnosis present

## 2011-06-30 DIAGNOSIS — O99892 Other specified diseases and conditions complicating childbirth: Secondary | ICD-10-CM | POA: Diagnosis present

## 2011-06-30 DIAGNOSIS — O98519 Other viral diseases complicating pregnancy, unspecified trimester: Secondary | ICD-10-CM | POA: Diagnosis present

## 2011-06-30 DIAGNOSIS — Z22330 Carrier of Group B streptococcus: Secondary | ICD-10-CM

## 2011-06-30 LAB — CBC
HCT: 34.5 % — ABNORMAL LOW (ref 36.0–46.0)
Hemoglobin: 11.5 g/dL — ABNORMAL LOW (ref 12.0–15.0)
MCV: 81.2 fL (ref 78.0–100.0)
RDW: 14.7 % (ref 11.5–15.5)
WBC: 9 10*3/uL (ref 4.0–10.5)

## 2011-06-30 MED ORDER — ZOLPIDEM TARTRATE 5 MG PO TABS: 5.0000 mg | ORAL_TABLET | Freq: Every evening | ORAL | Status: DC | PRN

## 2011-06-30 MED ORDER — ONDANSETRON HCL 4 MG/2ML IJ SOLN: 4.0000 mg | Freq: Four times a day (QID) | INTRAMUSCULAR | Status: DC | PRN

## 2011-06-30 MED ORDER — LACTATED RINGERS IV SOLN
500.0000 mL | Freq: Once | INTRAVENOUS | Status: AC
  Administered 2011-06-30: 10:00:00 via INTRAVENOUS

## 2011-06-30 MED ORDER — TERBUTALINE SULFATE 1 MG/ML IJ SOLN: 0.2500 mg | Freq: Once | INTRAMUSCULAR | Status: DC | PRN

## 2011-06-30 MED ORDER — FENTANYL 2.5 MCG/ML BUPIVACAINE 1/10 % EPIDURAL INFUSION (WH - ANES)
14.0000 mL/h | INTRAMUSCULAR | Status: DC
  Administered 2011-06-30 (×3): 14 mL/h via EPIDURAL
  Filled 2011-06-30 (×3): qty 60

## 2011-06-30 MED ORDER — PRENATAL MULTIVITAMIN CH
1.0000 | ORAL_TABLET | Freq: Every day | ORAL | Status: DC
  Administered 2011-06-30 – 2011-07-02 (×3): 1 via ORAL
  Filled 2011-06-30 (×3): qty 1

## 2011-06-30 MED ORDER — OXYTOCIN 20 UNITS IN LACTATED RINGERS INFUSION - SIMPLE: 125.0000 mL/h | Freq: Once | INTRAVENOUS | Status: DC

## 2011-06-30 MED ORDER — ONDANSETRON HCL 4 MG PO TABS: 4.0000 mg | ORAL_TABLET | ORAL | Status: DC | PRN

## 2011-06-30 MED ORDER — LANOLIN HYDROUS EX OINT: TOPICAL_OINTMENT | CUTANEOUS | Status: DC | PRN

## 2011-06-30 MED ORDER — BUTORPHANOL TARTRATE 2 MG/ML IJ SOLN: 1.0000 mg | INTRAMUSCULAR | Status: DC | PRN

## 2011-06-30 MED ORDER — EPHEDRINE 5 MG/ML INJ: 10.0000 mg | INTRAVENOUS | Status: DC | PRN

## 2011-06-30 MED ORDER — IBUPROFEN 600 MG PO TABS
600.0000 mg | ORAL_TABLET | Freq: Four times a day (QID) | ORAL | Status: DC
  Administered 2011-07-01 – 2011-07-02 (×6): 600 mg via ORAL
  Filled 2011-06-30 (×6): qty 1

## 2011-06-30 MED ORDER — DIPHENHYDRAMINE HCL 25 MG PO CAPS: 25.0000 mg | ORAL_CAPSULE | Freq: Four times a day (QID) | ORAL | Status: DC | PRN

## 2011-06-30 MED ORDER — TETANUS-DIPHTH-ACELL PERTUSSIS 5-2.5-18.5 LF-MCG/0.5 IM SUSP
0.5000 mL | Freq: Once | INTRAMUSCULAR | Status: AC
  Administered 2011-07-01: 0.5 mL via INTRAMUSCULAR
  Filled 2011-06-30: qty 0.5

## 2011-06-30 MED ORDER — PENICILLIN G POTASSIUM 5000000 UNITS IJ SOLR
2.5000 10*6.[IU] | INTRAVENOUS | Status: DC
  Administered 2011-06-30 (×2): 2.5 10*6.[IU] via INTRAVENOUS
  Filled 2011-06-30 (×5): qty 2.5

## 2011-06-30 MED ORDER — WITCH HAZEL-GLYCERIN EX PADS: 1.0000 "application " | MEDICATED_PAD | CUTANEOUS | Status: DC | PRN

## 2011-06-30 MED ORDER — PHENYLEPHRINE 40 MCG/ML (10ML) SYRINGE FOR IV PUSH (FOR BLOOD PRESSURE SUPPORT): 80.0000 ug | PREFILLED_SYRINGE | INTRAVENOUS | Status: DC | PRN

## 2011-06-30 MED ORDER — ONDANSETRON HCL 4 MG/2ML IJ SOLN: 4.0000 mg | INTRAMUSCULAR | Status: DC | PRN

## 2011-06-30 MED ORDER — LACTATED RINGERS IV SOLN
500.0000 mL | INTRAVENOUS | Status: DC | PRN
Start: 2011-06-30 — End: 2011-06-30

## 2011-06-30 MED ORDER — OXYTOCIN BOLUS FROM INFUSION
500.0000 mL | Freq: Once | INTRAVENOUS | Status: DC
  Filled 2011-06-30: qty 500

## 2011-06-30 MED ORDER — DIBUCAINE 1 % RE OINT
1.0000 "application " | TOPICAL_OINTMENT | RECTAL | Status: DC | PRN
  Filled 2011-06-30: qty 28

## 2011-06-30 MED ORDER — SIMETHICONE 80 MG PO CHEW: 80.0000 mg | CHEWABLE_TABLET | ORAL | Status: DC | PRN

## 2011-06-30 MED ORDER — LIDOCAINE HCL (PF) 1 % IJ SOLN
INTRAMUSCULAR | Status: DC | PRN
Start: 2011-06-30 — End: 2011-06-30
  Administered 2011-06-30 (×3): 4 mL

## 2011-06-30 MED ORDER — SENNOSIDES-DOCUSATE SODIUM 8.6-50 MG PO TABS
2.0000 | ORAL_TABLET | Freq: Every day | ORAL | Status: DC
Start: 2011-06-30 — End: 2011-07-02
  Administered 2011-06-30: 2 via ORAL

## 2011-06-30 MED ORDER — CITRIC ACID-SODIUM CITRATE 334-500 MG/5ML PO SOLN: 30.0000 mL | ORAL | Status: DC | PRN

## 2011-06-30 MED ORDER — BENZOCAINE-MENTHOL 20-0.5 % EX AERO: 1.0000 "application " | INHALATION_SPRAY | CUTANEOUS | Status: DC | PRN

## 2011-06-30 MED ORDER — OXYTOCIN 20 UNITS IN LACTATED RINGERS INFUSION - SIMPLE
1.0000 m[IU]/min | INTRAVENOUS | Status: DC
  Administered 2011-06-30: 10 m[IU]/min via INTRAVENOUS
  Administered 2011-06-30: 2 m[IU]/min via INTRAVENOUS
  Filled 2011-06-30: qty 1000

## 2011-06-30 MED ORDER — OXYCODONE-ACETAMINOPHEN 5-325 MG PO TABS: 1.0000 | ORAL_TABLET | ORAL | Status: DC | PRN

## 2011-06-30 MED ORDER — OXYCODONE-ACETAMINOPHEN 5-325 MG PO TABS
1.0000 | ORAL_TABLET | ORAL | Status: DC | PRN
  Administered 2011-07-01 – 2011-07-02 (×5): 1 via ORAL
  Filled 2011-06-30 (×6): qty 1

## 2011-06-30 MED ORDER — ACETAMINOPHEN 325 MG PO TABS: 650.0000 mg | ORAL_TABLET | ORAL | Status: DC | PRN

## 2011-06-30 MED ORDER — FLEET ENEMA 7-19 GM/118ML RE ENEM: 1.0000 | ENEMA | RECTAL | Status: DC | PRN

## 2011-06-30 MED ORDER — LACTATED RINGERS IV SOLN
INTRAVENOUS | Status: DC
Start: 2011-06-30 — End: 2011-06-30
  Administered 2011-06-30 (×2): via INTRAVENOUS

## 2011-06-30 MED ORDER — LIDOCAINE HCL (PF) 1 % IJ SOLN
30.0000 mL | INTRAMUSCULAR | Status: DC | PRN
  Filled 2011-06-30: qty 30

## 2011-06-30 MED ORDER — PENICILLIN G POTASSIUM 5000000 UNITS IJ SOLR
5.0000 10*6.[IU] | Freq: Once | INTRAVENOUS | Status: AC
  Administered 2011-06-30: 5 10*6.[IU] via INTRAVENOUS
  Filled 2011-06-30: qty 5

## 2011-06-30 MED ORDER — IBUPROFEN 600 MG PO TABS
600.0000 mg | ORAL_TABLET | Freq: Four times a day (QID) | ORAL | Status: DC | PRN
  Administered 2011-06-30: 600 mg via ORAL
  Filled 2011-06-30: qty 1

## 2011-06-30 MED ORDER — DIPHENHYDRAMINE HCL 50 MG/ML IJ SOLN: 12.5000 mg | INTRAMUSCULAR | Status: DC | PRN

## 2011-06-30 NOTE — Anesthesia Procedure Notes (Signed)
Epidural Patient location during procedure: OB Start time: 06/30/2011 10:09 AM Reason for block: procedure for pain  Staffing Performed by: anesthesiologist   Preanesthetic Checklist Completed: patient identified, site marked, surgical consent, pre-op evaluation, timeout performed, IV checked, risks and benefits discussed and monitors and equipment checked  Epidural Patient position: sitting Prep: site prepped and draped and DuraPrep Patient monitoring: continuous pulse ox and blood pressure Approach: midline Injection technique: LOR air  Needle:  Needle type: Tuohy  Needle gauge: 17 G Needle length: 9 cm Needle insertion depth: 5 cm cm Catheter type: closed end flexible Catheter size: 19 Gauge Catheter at skin depth: 10 cm Test dose: negative  Assessment Events: blood not aspirated, injection not painful, no injection resistance, negative IV test and no paresthesia  Additional Notes Discussed risk of headache, infection, bleeding, nerve injury and failed or incomplete block.  Patient voices understanding and wishes to proceed.

## 2011-06-30 NOTE — Progress Notes (Signed)
   Subjective: Pt comfortable with epidural  Objective: BP 128/80  Pulse 71  Temp(Src) 98.4 F (36.9 C) (Oral)  Resp 16  Ht 5' 3.5" (1.613 m)  Wt 76.658 kg (169 lb)  BMI 29.47 kg/m2  SpO2 100%      FHT:  FHR: 140 bpm, variability: moderate,  accelerations:  Present,  decelerations:  Present mild variables UC:   regular, every 2-3 minutes SVE:   Dilation: 4 Effacement (%): 90 Station: -1 Exam by:: Isaiha Asare  Labs: Lab Results  Component Value Date   WBC 9.0 06/30/2011   HGB 11.5* 06/30/2011   HCT 34.5* 06/30/2011   MCV 81.2 06/30/2011   PLT 260 06/30/2011    Assessment / Plan: Pt making progress, IUPC placed to help with contraction monitoring and adjustment of pitocin. Oliver Pila 06/30/2011, 1:13 PM

## 2011-06-30 NOTE — Anesthesia Preprocedure Evaluation (Signed)

## 2011-06-30 NOTE — H&P (Signed)
Kristin Kemp is a 21 y.o. female G2P0010 at 40 weeks (ED 06/30/11 by LMP c/w 18 week [place]) presenting for induction given term.  Prenatal care complicated by +GBS status.  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  Some external condyloma.  No other issues.  History OB History    Grav Para Term Preterm Abortions TAB SAB Ect Mult Living   2    1  1   0 0    SAB x 1 2009  Past Medical History  Diagnosis Date  . Urinary tract infection   . Ovarian cyst   . Abnormal Pap smear   . Herpes   . Late prenatal care    Past Surgical History  Procedure Date  . Dilation and curettage of uterus 2009   Family History: family history includes Cancer in her paternal aunt and paternal uncle; Diabetes in her maternal grandfather and maternal uncle; Heart disease in her maternal grandmother; Hyperlipidemia in her maternal grandmother; Hypertension in her maternal grandmother and maternal uncle; and Kidney disease in her maternal uncle.  There is no history of Anesthesia problems. Social History:  reports that she quit smoking about 8 months ago. She has never used smokeless tobacco. She reports that she does not drink alcohol or use illicit drugs.  ROS  Dilation: 2 Effacement (%): 50 Station: -1 Exam by:: Kristin Kemp  AROM clear  Blood pressure 129/93, pulse 95, temperature 98.2 F (36.8 C), temperature source Oral, resp. rate 18, height 5' 3.5" (1.613 m), weight 76.658 kg (169 lb). Maternal Exam:  Uterine Assessment: Contraction strength is mild.  Contraction frequency is irregular.   Abdomen: Fetal presentation: vertex  Introitus: Normal vulva. 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    Physical Exam  Constitutional: She is oriented to person, place, and time. She appears well-developed and well-nourished.  Cardiovascular: Normal rate and regular rhythm.   Respiratory: Effort normal and breath sounds normal.  GI: Soft. Bowel sounds are normal.  Genitourinary: Vagina normal and  uterus normal.  Musculoskeletal: Normal range of motion.  Neurological: She is alert and oriented to person, place, and time.  Psychiatric: She has a normal mood and affect. Her behavior is normal.    Prenatal labs: ABO, Rh:  A positive Antibody: Negative (10/18 0000) Rubella: Immune (10/18 0000) RPR: Nonreactive (10/18 0000)  HBsAg: Negative (10/18 0000)  HIV: Non-reactive (10/18 0000)  GBS: Positive (02/04 0000)  One hour GTT 98 Quad screen negative Hgb AS  Assessment/Plan: Pt for induction of labor, plans epidural.  Already received PCN for +GBS.   Kristin Kemp W 06/30/2011, 10:00 AM

## 2011-07-01 LAB — CBC
HCT: 32.3 % — ABNORMAL LOW (ref 36.0–46.0)
MCH: 27.5 pg (ref 26.0–34.0)
MCV: 81.6 fL (ref 78.0–100.0)
Platelets: 221 10*3/uL (ref 150–400)
RDW: 14.6 % (ref 11.5–15.5)

## 2011-07-01 NOTE — Progress Notes (Signed)
Post Partum Day 1 Subjective: no complaints, up ad lib and tolerating PO  Objective: Blood pressure 122/86, pulse 89, temperature 98.4 F (36.9 C), temperature source Oral, resp. rate 18, height 5' 3.5" (1.613 m), weight 76.658 kg (169 lb), SpO2 99.00%, unknown if currently breastfeeding.  Physical Exam:  General: alert Lochia: appropriate Uterine Fundus: firm    Basename 07/01/11 0610 06/30/11 0640  HGB 10.9* 11.5*  HCT 32.3* 34.5*    Assessment/Plan: Plan for discharge tomorrow Plans Depo provera for birth control   LOS: 1 day   Justeen Hehr W 07/01/2011, 9:22 AM

## 2011-07-01 NOTE — Progress Notes (Signed)
UR chart review completed.  

## 2011-07-01 NOTE — Anesthesia Postprocedure Evaluation (Signed)
Anesthesia Post Note  Patient: Kristin Kemp  Procedure(s) Performed: * No procedures listed *  Anesthesia type: Epidural  Patient location: Mother/Baby  Post pain: Pain level controlled  Post assessment: Post-op Vital signs reviewed  Last Vitals:  Filed Vitals:   07/01/11 0506  BP: 122/86  Pulse: 89  Temp: 36.9 C  Resp: 18    Post vital signs: Reviewed  Level of consciousness:alert  Complications: No apparent anesthesia complications

## 2011-07-02 MED ORDER — IBUPROFEN 600 MG PO TABS: 600.0000 mg | ORAL_TABLET | Freq: Four times a day (QID) | ORAL | Status: AC

## 2011-07-02 NOTE — Discharge Summary (Signed)
[name], Kristin Kemp            ACCOUNT NO.:  [id number]  MEDICAL RECORD NO.:  [id number]  LOCATION:  9123                          FACILITY:  [facility]  PHYSICIAN:  Malachi Pro. Ambrose Mantle, M.D. DATE OF BIRTH:  [birth year]  DATE OF ADMISSION:  06/30/2011 DATE OF DISCHARGE:  07/02/2011                              DISCHARGE SUMMARY   HISTORY:  This is a 21 year old black female, para 0-0-1-0 gravida 2 at [redacted] weeks gestation with [facility] of June 30, 2011 compatible with an 8 week ultrasound, presented for induction of labor at term.  Prenatal care complicated by positive group B strep,  history of herpes on Valtrex since 36 weeks, some external condyloma but no other issues.  Blood group and type A positive, negative antibody, rubella immune, RPR nonreactive, hepatitis B surface antigen negative, HIV negative, group B strep positive, 1-hour Glucola 98, quad screen negative, hemoglobin AS. The patient was placed into labor with Pitocin.  An intrauterine pressure catheter was placed to help with contraction monitoring and adjustment of Pitocin. The patient progressed to full dilatation and delivered a living female infant, 6 pounds 8 ounces spontaneously, Apgars were 7 and 9 at 1 and 5 minutes. There was a tight nuchal cord. Anesthesia was epidural.  No episiotomy. First-degree lacerations repaired with 3-0 Vicryl.  Blood loss about 350 mL.  Postpartum, the patient did well.  She had no significant problems.  Initial hemoglobin was 11.5, hematocrit 34.5. Followup hemoglobin 10.9, hematocrit 32.3. The patient plan Depo-Provera for birth control. On the second postpartum day, she was afebrile.  Her blood pressure was slightly high to high normal. Her range was 116/80-137/92, and she is given instructions to watch her blood pressure and watch for any headache or epigastric pain.  Her RPR was nonreactive.  FINAL DIAGNOSES:  Intrauterine pregnancy at term, delivered LOA; operation spontaneous delivery  LOA; repair of first-degree laceration. Final condition improved.  Instructions include the regular discharge instruction booklet.  The patient is advised to watch her blood pressure and call with any significant headache or epigastric pain and call with blood pressures above 150/100. Otherwise she is to return in 6 weeks for followup examination.     Malachi Pro. Ambrose Mantle, M.D.     TFH/MEDQ  D:  07/02/2011  T:  07/02/2011  Job:  [id number]

## 2011-07-02 NOTE — Progress Notes (Signed)
Patient ID: Kristin Kemp, female   DOB: 20-Sep-1990, 21 y.o.   MRN: [id number] #2 afebrile BP as high as 137/92. For d/c.

## 2011-07-02 NOTE — Discharge Instructions (Signed)
booklet °

## 2011-07-20 ENCOUNTER — Encounter (HOSPITAL_COMMUNITY): Payer: Medicaid Other

## 2011-11-17 ENCOUNTER — Emergency Department (HOSPITAL_COMMUNITY)
Admission: EM | Admit: 2011-11-17 | Discharge: 2011-11-18 | Disposition: A | Discharge status: Home / Self Care | Payer: Medicaid Other | Attending: Emergency Medicine | Admitting: Emergency Medicine

## 2011-11-17 ENCOUNTER — Encounter (HOSPITAL_COMMUNITY): Payer: Self-pay | Admitting: *Deleted

## 2011-11-17 DIAGNOSIS — J40 Bronchitis, not specified as acute or chronic: Secondary | ICD-10-CM | POA: Insufficient documentation

## 2011-11-17 DIAGNOSIS — J9801 Acute bronchospasm: Secondary | ICD-10-CM

## 2011-11-17 DIAGNOSIS — F17200 Nicotine dependence, unspecified, uncomplicated: Secondary | ICD-10-CM | POA: Insufficient documentation

## 2011-11-17 MED ORDER — ALBUTEROL SULFATE HFA 108 (90 BASE) MCG/ACT IN AERS
2.0000 | INHALATION_SPRAY | RESPIRATORY_TRACT | Status: DC | PRN
  Administered 2011-11-18: 2 via RESPIRATORY_TRACT
  Filled 2011-11-17: qty 6.7

## 2011-11-17 NOTE — ED Notes (Signed)
Pt c/o sore throat, cough, nasal congestion since Sunday.  Does feel SOB tonight.

## 2011-11-17 NOTE — ED Notes (Signed)
RT called for albuterol inhaler.

## 2011-11-17 NOTE — ED Provider Notes (Signed)
History     CSN: [id number]  Arrival date & time 11/17/11  2306   None     Chief Complaint  Patient presents with  . Sore Throat  . Cough    (Consider location/radiation/quality/duration/timing/severity/associated sxs/prior treatment) HPI Comments: Patient with URI symptoms, sore throat, and cough.  Patient has been unaware of a fever until tonight when she developed chills.  She is most concerned about "wheezing" with her cough as she is not an asthmatic.  Taking over-the-counter medication, i.e., Alka-Seltzer plus, and Tylenol, cold and sinus for her symptoms without relief  Patient is a 21 y.o. female presenting with pharyngitis and cough.  Sore Throat This is a new problem. The current episode started yesterday. The problem occurs constantly. The problem has been gradually worsening. Associated symptoms include coughing, a fever and a sore throat. Nothing aggravates the symptoms. She has tried NSAIDs for the symptoms. The treatment provided no relief.  Cough Associated symptoms include rhinorrhea, sore throat and wheezing.    Past Medical History  Diagnosis Date  . Urinary tract infection   . Ovarian cyst   . Abnormal Pap smear   . Herpes   . Late prenatal care     Past Surgical History  Procedure Date  . Dilation and curettage of uterus 2009    Family History  Problem Relation Age of Onset  . Hypertension Maternal Grandmother   . Hyperlipidemia Maternal Grandmother   . Heart disease Maternal Grandmother     enlarged heart  . Anesthesia problems Neg Hx   . Diabetes Maternal Uncle   . Hypertension Maternal Uncle   . Kidney disease Maternal Uncle   . Cancer Paternal Aunt     lung  . Cancer Paternal Uncle     lung  . Diabetes Maternal Grandfather     History  Substance Use Topics  . Smoking status: Current Everyday Smoker -- 0.3 packs/day    Last Attempt to Quit: 10/21/2010  . Smokeless tobacco: Never Used  . Alcohol Use: Yes     occasionally    OB  History    Grav Para Term Preterm Abortions TAB SAB Ect Mult Living   2 1 1  1  1   0 1      Review of Systems  Constitutional: Positive for fever.  HENT: Positive for sore throat, rhinorrhea and postnasal drip.   Respiratory: Positive for cough and wheezing.     Allergies  Chocolate; Cinnamon; and Latex  Home Medications   Current Outpatient Rx  Name Route Sig Dispense Refill  . OVER THE COUNTER MEDICATION Oral Take 1 Wafer by mouth every 6 (six) hours as needed. Alka-Seltzer Plus For cold symptoms    . OVER THE COUNTER MEDICATION Oral Take 1 tablet by mouth every 6 (six) hours as needed. Tylenol Cold and Sinus for cold symptoms    . AZITHROMYCIN 250 MG PO TABS Oral Take 1 tablet (250 mg total) by mouth daily. 4 tablet 0    BP 121/91  Pulse 92  Temp 100.1 F (37.8 C) (Oral)  Resp 20  SpO2 97%  Physical Exam  Constitutional: She appears well-developed and well-nourished.  HENT:  Head: Normocephalic.  Mouth/Throat: Uvula is midline. Posterior oropharyngeal erythema present. No oropharyngeal exudate or posterior oropharyngeal edema.  Eyes: Pupils are equal, round, and reactive to light.  Cardiovascular: Normal rate and regular rhythm.   Pulmonary/Chest: Effort normal. No respiratory distress. She has wheezes. She exhibits no tenderness.  Neurological: She is alert.  Skin: Skin is warm. No rash noted.    ED Course  Procedures (including critical care time)   Labs Reviewed  RAPID STREP SCREEN   Dg Chest 2 View  11/18/2011  *RADIOLOGY REPORT*  Clinical Data: Cough, fever, wheezing  CHEST - 2 VIEW  Comparison: 12/18/2007  Findings: Lungs are clear. No pleural effusion or pneumothorax.  Cardiomediastinal silhouette is within normal limits.  Visualized osseous structures are within normal limits.  IMPRESSION: No evidence of acute cardiopulmonary disease.  Original Report Authenticated By: Charline Bills, M.D.     1. Bronchitis   2. Bronchospasm       MDM    We'll give albuterol inhaler in the department the nurse, has been instructed to teach the patient had to use this with an AeroChamber will obtain chest x-ray, and a rapid strep has already been ordered and obtained        Arman Filter, NP 11/18/11 0101

## 2011-11-18 ENCOUNTER — Emergency Department (HOSPITAL_COMMUNITY): Payer: Medicaid Other

## 2011-11-18 MED ORDER — AZITHROMYCIN 250 MG PO TABS
500.0000 mg | ORAL_TABLET | Freq: Once | ORAL | Status: AC
  Administered 2011-11-18: 500 mg via ORAL
  Filled 2011-11-18: qty 2

## 2011-11-18 MED ORDER — AZITHROMYCIN 250 MG PO TABS: 250.0000 mg | ORAL_TABLET | Freq: Every day | ORAL | Status: AC

## 2011-11-18 NOTE — ED Notes (Signed)
Patient transported to and from X-ray 

## 2011-11-19 NOTE — ED Provider Notes (Signed)
Medical screening examination/treatment/procedure(s) were performed by non-physician practitioner and as supervising physician I was immediately available for consultation/collaboration.    Risha Barretta D Adriann Thau, MD 11/19/11 1540 

## 2012-11-03 ENCOUNTER — Emergency Department (HOSPITAL_COMMUNITY)
Admission: EM | Admit: 2012-11-03 | Discharge: 2012-11-03 | Disposition: A | Discharge status: Home / Self Care | Payer: Medicaid Other | Attending: Emergency Medicine | Admitting: Emergency Medicine

## 2012-11-03 ENCOUNTER — Encounter (HOSPITAL_COMMUNITY): Payer: Self-pay | Admitting: *Deleted

## 2012-11-03 DIAGNOSIS — Z8619 Personal history of other infectious and parasitic diseases: Secondary | ICD-10-CM | POA: Insufficient documentation

## 2012-11-03 DIAGNOSIS — F17200 Nicotine dependence, unspecified, uncomplicated: Secondary | ICD-10-CM | POA: Insufficient documentation

## 2012-11-03 DIAGNOSIS — Z8742 Personal history of other diseases of the female genital tract: Secondary | ICD-10-CM | POA: Insufficient documentation

## 2012-11-03 DIAGNOSIS — Z91040 Latex allergy status: Secondary | ICD-10-CM | POA: Insufficient documentation

## 2012-11-03 DIAGNOSIS — Z87440 Personal history of urinary (tract) infections: Secondary | ICD-10-CM | POA: Insufficient documentation

## 2012-11-03 DIAGNOSIS — L509 Urticaria, unspecified: Secondary | ICD-10-CM

## 2012-11-03 DIAGNOSIS — T7840XA Allergy, unspecified, initial encounter: Secondary | ICD-10-CM

## 2012-11-03 MED ORDER — PREDNISONE 20 MG PO TABS
60.0000 mg | ORAL_TABLET | Freq: Once | ORAL | Status: AC
  Administered 2012-11-03: 60 mg via ORAL
  Filled 2012-11-03: qty 3

## 2012-11-03 NOTE — ED Provider Notes (Signed)
History  This chart was scribed for Kristin Octave, MD by Ardelia Mems, ED Scribe. This patient was seen in room TR08C/TR08C and the patient's care was started at 9:55 PM.  CSN: [id number]  Arrival date & time 11/03/12  2132   Chief Complaint  Patient presents with  . Allergic Reaction    The history is provided by the patient. No language interpreter was used.   HPI Comments: Kristin Kemp is a 22 y.o. female who presents to the Emergency Department complaining of a suspected allergic reaction to chocolate. 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 Pt states that ate chocolate 1 day ago, and she broke out into a rash on her stomach yesterday, which gradually has spread to her back. Pt denies using new soaps detergents, lotions, or being around new animals. Pt denies recent travel. Pt denies sick contacts, but works in proximity of others. Pt denies taking any new medications. Pt denies difficulty breathing or difficulty swallowing, or any other symptoms. Pt states that she took Benadryl with only mild relief today.   Past Medical History  Diagnosis Date  . Urinary tract infection   . Ovarian cyst   . Abnormal Pap smear   . Herpes   . Late prenatal care    Past Surgical History  Procedure Laterality Date  . Dilation and curettage of uterus  2009   Family History  Problem Relation Age of Onset  . Hypertension Maternal Grandmother   . Hyperlipidemia Maternal Grandmother   . Heart disease Maternal Grandmother     enlarged heart  . Anesthesia problems Neg Hx   . Diabetes Maternal Uncle   . Hypertension Maternal Uncle   . Kidney disease Maternal Uncle   . Cancer Paternal Aunt     lung  . Cancer Paternal Uncle     lung  . Diabetes Maternal Grandfather    History  Substance Use Topics  . Smoking status: Current Every Day Smoker -- 0.30 packs/day    Last Attempt to Quit: 10/21/2010  . Smokeless  tobacco: Never Used  . Alcohol Use: Yes     Comment: occasionally   OB History   Grav Para Term Preterm Abortions TAB SAB Ect Mult Living   2 1 1  1  1   0 1     Review of Systems  Constitutional: Negative for fever and chills.  HENT: Negative for congestion, sore throat, rhinorrhea, trouble swallowing and neck pain.   Eyes: Negative for visual disturbance.  Respiratory: Negative for cough and shortness of breath.   Cardiovascular: Negative for chest pain.  Gastrointestinal: Negative for nausea, vomiting, abdominal pain and diarrhea.  Genitourinary: Negative for dysuria.  Musculoskeletal: Negative for back pain.  Skin: Positive for rash.  Neurological: Negative for headaches.  Psychiatric/Behavioral: Negative for confusion.  A complete 10 system review of systems was obtained and all systems are negative except as noted in the HPI and PMH.   Allergies  Chocolate; Cinnamon; and Latex  Home Medications   Current Outpatient Rx  Name  Route  Sig  Dispense  Refill  . OVER THE COUNTER MEDICATION   Oral   Take 1 Wafer by mouth every 6 (six) hours as needed. Alka-Seltzer Plus For cold symptoms         . OVER THE COUNTER MEDICATION   Oral   Take 1 tablet by mouth every 6 (six) hours as needed. Tylenol Cold and  Sinus for cold symptoms          Triage Vitals: BP 124/70  Pulse 92  Resp 18  SpO2 99%  Physical Exam  Nursing note and vitals reviewed. Constitutional: She is oriented to person, place, and time. She appears well-developed and well-nourished. No distress.  HENT:  Head: Normocephalic and atraumatic.  Mouth/Throat: Oropharynx is clear and moist.  Eyes: Conjunctivae and EOM are normal.  Neck: Normal range of motion. Neck supple.  Cardiovascular: Normal rate, regular rhythm and normal heart sounds.   Pulmonary/Chest: Effort normal and breath sounds normal. No respiratory distress.  Musculoskeletal: Normal range of motion. She exhibits no edema.  Neurological: She  is alert and oriented to person, place, and time. No sensory deficit.  Skin: Skin is warm and dry.  Few, scattered urticarial rashes on her abdomen and lower back.  Psychiatric: She has a normal mood and affect. Her behavior is normal.    ED Course  Procedures (including critical care time)  DIAGNOSTIC STUDIES: Oxygen Saturation is 99% on RA, normal by my interpretation.    COORDINATION OF CARE: 10:03 PM- Pt advised of plan for treatment and pt agrees.  Medications  predniSONE (DELTASONE) tablet 60 mg (not administered)     Labs Reviewed - No data to display  No results found.  1. Allergic reaction, initial encounter   2. Hives     MDM  Patient with allergic reaction most likely from chocolate. No other known exposures. No respiratory or airway compromise. Mild scattered urticaria on abdomen and back. She is in no apparent distress with normal vital signs. 60 milligrams oral prednisone given in the emergency department. Advised her to continue Benadryl as needed for itching. Return precautions discussed. Patient states understanding of plan and is agreeable.      I personally performed the services described in this documentation, which was scribed in my presence. The recorded information has been reviewed and is accurate.   Trevor Mace, PA-C 11/03/12 2218  Trevor Mace, PA-C 11/03/12 2218

## 2012-11-03 NOTE — ED Provider Notes (Signed)
Medical screening examination/treatment/procedure(s) were performed by non-physician practitioner and as supervising physician I was immediately available for consultation/collaboration.   Ourania Hamler, MD 11/03/12 2324 

## 2012-11-03 NOTE — ED Notes (Signed)
Pt in c/o allergic reaction since yesterday, states she ate some chocolate last night which she has a known allergy to, but states she is normally able to take a benadryl and have a little bit, states she developed a rash that spread from her abdomen, rash continued today.

## 2012-11-22 ENCOUNTER — Encounter: Payer: Self-pay | Admitting: Family Medicine

## 2012-11-22 ENCOUNTER — Ambulatory Visit: Payer: Self-pay | Admitting: Family Medicine

## 2012-11-22 VITALS — BP 106/62 | HR 72 | Temp 98.2°F | Resp 16 | Ht 63.0 in | Wt 112.0 lb

## 2012-11-22 DIAGNOSIS — Z021 Encounter for pre-employment examination: Secondary | ICD-10-CM

## 2012-11-22 DIAGNOSIS — Z0289 Encounter for other administrative examinations: Secondary | ICD-10-CM

## 2012-11-22 NOTE — Progress Notes (Signed)
Patient presents for urine drug screen, urine collected, Drivers license number [id number]. Patient must see provider also for this.   [facility]:  Office Visit  Chief Complaint:  Chief Complaint  Patient presents with  . drug screen    self pay/ pre employment    HPI: Kristin Kemp is a 22 y.o. female who complains of  Here for self pay drug screen. 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 Starting on December 11, 2012.   Past Medical History  Diagnosis Date  . Urinary tract infection   . Ovarian cyst   . Abnormal Pap smear   . Herpes   . Late prenatal care    Past Surgical History  Procedure Laterality Date  . Dilation and curettage of uterus  2009   History   Social History  . Marital Status: Single    Spouse Name: N/A    Number of Children: N/A  . Years of Education: N/A   Social History Main Topics  . Smoking status: Current Every Day Smoker -- 0.30 packs/day    Last Attempt to Quit: 10/21/2010  . Smokeless tobacco: Never Used  . Alcohol Use: Yes     Comment: occasionally  . Drug Use: Yes    Special: Marijuana  . Sexually Active: Yes   Other Topics Concern  . Not on file   Social History Narrative  . No narrative on file   Family History  Problem Relation Age of Onset  . Hypertension Maternal Grandmother   . Hyperlipidemia Maternal Grandmother   . Heart disease Maternal Grandmother     enlarged heart  . Anesthesia problems Neg Hx   . Diabetes Maternal Uncle   . Hypertension Maternal Uncle   . Kidney disease Maternal Uncle   . Cancer Paternal Aunt     lung  . Cancer Paternal Uncle     lung  . Diabetes Maternal Grandfather    Allergies  Allergen Reactions  . Chocolate Hives and Itching  . Cinnamon   . Latex Hives   Prior to Admission medications   Medication Sig Start Date End Date Taking? Authorizing Provider  OVER THE COUNTER MEDICATION Take 1 Wafer by mouth every 6 (six) hours as  needed. Alka-Seltzer Plus For cold symptoms    Historical Provider, MD  OVER THE COUNTER MEDICATION Take 1 tablet by mouth every 6 (six) hours as needed. Tylenol Cold and Sinus for cold symptoms    Historical Provider, MD     ROS: The patient denies fevers, chills, night sweats, unintentional weight loss, chest pain, palpitations, wheezing, dyspnea on exertion, nausea, vomiting, abdominal pain, dysuria, hematuria, melena, numbness, weakness, or tingling.  All other systems have been reviewed and were otherwise negative with the exception of those mentioned in the HPI and as above.    PHYSICAL EXAM: Filed Vitals:   11/22/12 1341  BP: 106/62  Pulse: 72  Temp: 98.2 F (36.8 C)  Resp: 16   Filed Vitals:   11/22/12 1341  Height: 5\' 3"  (1.6 m)  Weight: 112 lb (50.803 kg)   Body mass index is 19.84 kg/(m^2).  General: Alert, no acute distress HEENT:  Normocephalic, atraumatic, oropharynx patent.  Cardiovascular:  Regular rate and rhythm, no rubs murmurs or gallops.  No Carotid bruits, radial pulse intact. No pedal edema.  Respiratory: Clear to auscultation bilaterally.  No wheezes, rales, or rhonchi.  No cyanosis, no use of accessory musculature GI: No organomegaly, abdomen  is soft and non-tender, positive bowel sounds.  No masses. Skin: No rashes. Neurologic: Facial musculature symmetric. Psychiatric: Patient is appropriate throughout our interaction. Lymphatic: No cervical lymphadenopathy Musculoskeletal: Gait intact.   LABS: Results for orders placed during the hospital encounter of 11/17/11  RAPID STREP SCREEN      Result Value Range   Streptococcus, Group A Screen (Direct) NEGATIVE  NEGATIVE     EKG/XRAY:   Primary read interpreted by Dr. Conley Rolls at [facility].   ASSESSMENT/PLAN: Encounter Diagnosis  Name Primary?  . Drug screening, pre-employment Yes   Follow-up for drug screen results     LE, THAO PHUONG, DO 11/22/2012 2:27 PM

## 2012-12-11 ENCOUNTER — Emergency Department (HOSPITAL_COMMUNITY)
Admission: EM | Admit: 2012-12-11 | Discharge: 2012-12-11 | Disposition: A | Discharge status: Home / Self Care | Payer: Medicaid Other | Attending: Emergency Medicine | Admitting: Emergency Medicine

## 2012-12-11 ENCOUNTER — Encounter (HOSPITAL_COMMUNITY): Payer: Self-pay | Admitting: Nurse Practitioner

## 2012-12-11 DIAGNOSIS — Z8619 Personal history of other infectious and parasitic diseases: Secondary | ICD-10-CM | POA: Insufficient documentation

## 2012-12-11 DIAGNOSIS — O0930 Supervision of pregnancy with insufficient antenatal care, unspecified trimester: Secondary | ICD-10-CM | POA: Insufficient documentation

## 2012-12-11 DIAGNOSIS — Z9889 Other specified postprocedural states: Secondary | ICD-10-CM | POA: Insufficient documentation

## 2012-12-11 DIAGNOSIS — F17200 Nicotine dependence, unspecified, uncomplicated: Secondary | ICD-10-CM | POA: Insufficient documentation

## 2012-12-11 DIAGNOSIS — M545 Low back pain, unspecified: Secondary | ICD-10-CM | POA: Insufficient documentation

## 2012-12-11 DIAGNOSIS — N760 Acute vaginitis: Secondary | ICD-10-CM | POA: Insufficient documentation

## 2012-12-11 DIAGNOSIS — Z3202 Encounter for pregnancy test, result negative: Secondary | ICD-10-CM | POA: Insufficient documentation

## 2012-12-11 DIAGNOSIS — A499 Bacterial infection, unspecified: Secondary | ICD-10-CM | POA: Insufficient documentation

## 2012-12-11 DIAGNOSIS — N939 Abnormal uterine and vaginal bleeding, unspecified: Secondary | ICD-10-CM

## 2012-12-11 DIAGNOSIS — Z91040 Latex allergy status: Secondary | ICD-10-CM | POA: Insufficient documentation

## 2012-12-11 DIAGNOSIS — B9689 Other specified bacterial agents as the cause of diseases classified elsewhere: Secondary | ICD-10-CM | POA: Insufficient documentation

## 2012-12-11 DIAGNOSIS — R109 Unspecified abdominal pain: Secondary | ICD-10-CM | POA: Insufficient documentation

## 2012-12-11 DIAGNOSIS — N898 Other specified noninflammatory disorders of vagina: Secondary | ICD-10-CM | POA: Insufficient documentation

## 2012-12-11 DIAGNOSIS — Z8742 Personal history of other diseases of the female genital tract: Secondary | ICD-10-CM | POA: Insufficient documentation

## 2012-12-11 LAB — CBC WITH DIFFERENTIAL/PLATELET
Eosinophils Relative: 3 % (ref 0–5)
HCT: 37 % (ref 36.0–46.0)
Lymphocytes Relative: 37 % (ref 12–46)
Lymphs Abs: 2.4 10*3/uL (ref 0.7–4.0)
MCH: 28.9 pg (ref 26.0–34.0)
MCV: 79.2 fL (ref 78.0–100.0)
Monocytes Absolute: 0.5 10*3/uL (ref 0.1–1.0)
RBC: 4.67 MIL/uL (ref 3.87–5.11)
RDW: 13.7 % (ref 11.5–15.5)
WBC: 6.5 10*3/uL (ref 4.0–10.5)

## 2012-12-11 LAB — URINE MICROSCOPIC-ADD ON

## 2012-12-11 LAB — COMPREHENSIVE METABOLIC PANEL
CO2: 26 mEq/L (ref 19–32)
Calcium: 9.7 mg/dL (ref 8.4–10.5)
Creatinine, Ser: 0.72 mg/dL (ref 0.50–1.10)
GFR calc Af Amer: >90 mL/min (ref >90)
GFR calc non Af Amer: >90 mL/min (ref >90)
Glucose, Bld: 102 mg/dL — ABNORMAL HIGH (ref 70–99)

## 2012-12-11 LAB — WET PREP, GENITAL: Yeast Wet Prep HPF POC: NONE SEEN

## 2012-12-11 LAB — URINALYSIS, ROUTINE W REFLEX MICROSCOPIC
Leukocytes, UA: NEGATIVE
Protein, ur: NEGATIVE mg/dL
Urobilinogen, UA: 1 mg/dL (ref 0.0–1.0)

## 2012-12-11 LAB — LIPASE, BLOOD: Lipase: 49 U/L (ref 11–59)

## 2012-12-11 LAB — POCT PREGNANCY, URINE: Preg Test, Ur: NEGATIVE

## 2012-12-11 MED ORDER — IBUPROFEN 800 MG PO TABS: 800.0000 mg | ORAL_TABLET | Freq: Three times a day (TID) | ORAL | Status: DC

## 2012-12-11 MED ORDER — METRONIDAZOLE 500 MG PO TABS: 500.0000 mg | ORAL_TABLET | Freq: Two times a day (BID) | ORAL | Status: DC

## 2012-12-11 MED ORDER — KETOROLAC TROMETHAMINE 30 MG/ML IJ SOLN
30.0000 mg | Freq: Once | INTRAMUSCULAR | Status: AC
  Administered 2012-12-11: 30 mg via INTRAMUSCULAR
  Filled 2012-12-11: qty 1

## 2012-12-11 NOTE — ED Provider Notes (Signed)
CSN: [id number]     Arrival date & time 12/11/12  1850 History  This chart was scribed for non-physician practitioner, Magnus Sinning, PA-C, working with Joya Gaskins, MD by Shari Heritage, ED Scribe. This patient was seen in room A04C/A04C and the patient's care was started at 8:14 PM.   First MD Initiated Contact with Patient 12/11/12 2014     Chief Complaint  Patient presents with  . Flank Pain    The history is provided by the patient. No language interpreter was used.    HPI Comments: Kristin Kemp is a 22 y.o. female who presents to the Emergency Department complaining of constant, non-radiating right lower back pain that began this morning and is exacerbated by movement. She denies any strenuous activity. She has not taken anything for pain prior to arrival.  She also reports abdominal cramping and abnormal vaginal bleeding with clotting. LMP was 3-4 months ago. Menstrual periods are usually abnormal due to Mirena IUD. She denies vomiting, fevers, chills, urinary symptoms, bowel or bladder incontinence, numbness or tingling.  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  Patient has a history of D&C. She is a current every day smoker.   Past Medical History  Diagnosis Date  . Urinary tract infection   . Ovarian cyst   . Abnormal Pap smear   . Herpes   . Late prenatal care    Past Surgical History  Procedure Laterality Date  . Dilation and curettage of uterus  2009   Family History  Problem Relation Age of Onset  . Hypertension Maternal Grandmother   . Hyperlipidemia Maternal Grandmother   . Heart disease Maternal Grandmother     enlarged heart  . Anesthesia problems Neg Hx   . Diabetes Maternal Uncle   . Hypertension Maternal Uncle   . Kidney disease Maternal Uncle   . Cancer Paternal Aunt     lung  . Cancer Paternal Uncle     lung  . Diabetes Maternal Grandfather    History  Substance Use Topics  . Smoking status: Current Every Day Smoker -- 0.30 packs/day     Types: Cigarettes    Last Attempt to Quit: 10/21/2010  . Smokeless tobacco: Never Used  . Alcohol Use: No     Comment: occasionally   OB History   Grav Para Term Preterm Abortions TAB SAB Ect Mult Living   2 1 1  1  1   0 1     Review of Systems A complete 10 system review of systems was obtained and all systems are negative except as noted in the HPI and PMH.   Allergies  Chocolate; Cinnamon; and Latex  Home Medications   Current Outpatient Rx  Name  Route  Sig  Dispense  Refill  . Hyprom-Naphaz-Polysorb-Zn Sulf (CLEAR EYES COMPLETE OP)   Both Eyes   Place 2 drops into both eyes as needed (irration).         Marland Kitchen levonorgestrel (MIRENA) 20 MCG/24HR IUD   Intrauterine   1 each by Intrauterine route once.          Triage Vitals: BP 116/74  Pulse 98  Temp(Src) 98.5 F (36.9 C) (Oral)  Resp 16  Ht 5\' 3"  (1.6 m)  Wt 115 lb (52.164 kg)  BMI 20.38 kg/m2  SpO2 100% Physical Exam  Nursing note and vitals reviewed. Constitutional: She is oriented to person, place, and time. She appears well-developed and well-nourished.  HENT:  Head: Normocephalic and atraumatic.  Eyes: EOM  are normal.  Cardiovascular: Normal rate and regular rhythm.   Pulmonary/Chest: Effort normal and breath sounds normal.  Abdominal: Soft. Bowel sounds are normal. There is no tenderness. There is no rebound, no guarding and no CVA tenderness.  Genitourinary: Cervix exhibits no motion tenderness. Right adnexum displays no mass, no tenderness and no fullness. Left adnexum displays no mass, no tenderness and no fullness. No tenderness around the vagina.   Small amount of bright red blood in vaginal vault. IUD in place.  Musculoskeletal:  No tenderness to palpation of TL spine.   Neurological: She is alert and oriented to person, place, and time.  Skin: Skin is warm and dry.  Psychiatric: She has a normal mood and affect.    ED Course  DIAGNOSTIC STUDIES: Oxygen Saturation is 100% on room air,  normal by my interpretation.    COORDINATION OF CARE: 8:25 PM-  Patient presents with right lower back pain and abdominal cramping. Labs are normal and do not suggest acute pathology. Patient informed of current plan for treatment and evaluation and agrees with plan at this time.    Procedures (including critical care time)  Labs Reviewed  CBC WITH DIFFERENTIAL - Abnormal; Notable for the following:    MCHC 36.5 (*)    All other components within normal limits  COMPREHENSIVE METABOLIC PANEL  LIPASE, BLOOD  URINALYSIS, ROUTINE W REFLEX MICROSCOPIC  POCT PREGNANCY, URINE    No results found.  No diagnosis found.  MDM  Patient presenting with abdominal cramping and small amount of vaginal bleeding.  She currently has an IUD in place.  LMP was several months ago.  Urine pregnancy negative.  UA negative.  Labs unremarkable.  No abdominal tenderness to palpation on exam.  No adnexal or CMT with pelvic exam.  Moderate clue cells on wet prep.  Patient treated with Rx for Flagyl.  Patient instructed not to drink alcohol while on medication.  Feel that patient is stable for discharge.  Patient instructed to follow up with her Gynecologist if symptoms continue.  I personally performed the services described in this documentation, which was scribed in my presence. The recorded information has been reviewed and is accurate.    Pascal Lux [place], PA-C 12/12/12 1640

## 2012-12-11 NOTE — ED Notes (Signed)
Pt states that she has had pain in the middle of the right side of her back. Pt reports pain with movement, but no pain to palpation.

## 2012-12-11 NOTE — ED Notes (Signed)
Pt c/o r sided flank pain on waking this am. Later on she began to have vaginal bleeding with clots. Pt has merena IUD placed last year and has never had any pain since it was placed. Worried the IUD may be out of place because every time she moves it hurts.

## 2012-12-12 LAB — GC/CHLAMYDIA PROBE AMP: GC Probe RNA: NEGATIVE

## 2012-12-13 NOTE — ED Provider Notes (Signed)
Medical screening examination/treatment/procedure(s) were performed by non-physician practitioner and as supervising physician I was immediately available for consultation/collaboration.   Tila Millirons W Theodora Lalanne, MD 12/13/12 1348 

## 2012-12-23 ENCOUNTER — Emergency Department (HOSPITAL_COMMUNITY)
Admission: EM | Admit: 2012-12-23 | Discharge: 2012-12-23 | Disposition: A | Discharge status: Home / Self Care | Payer: Medicaid Other | Attending: Emergency Medicine | Admitting: Emergency Medicine

## 2012-12-23 ENCOUNTER — Encounter (HOSPITAL_COMMUNITY): Payer: Self-pay | Admitting: *Deleted

## 2012-12-23 DIAGNOSIS — T7840XA Allergy, unspecified, initial encounter: Secondary | ICD-10-CM

## 2012-12-23 DIAGNOSIS — Z8742 Personal history of other diseases of the female genital tract: Secondary | ICD-10-CM | POA: Insufficient documentation

## 2012-12-23 DIAGNOSIS — Z91040 Latex allergy status: Secondary | ICD-10-CM | POA: Insufficient documentation

## 2012-12-23 DIAGNOSIS — Z87440 Personal history of urinary (tract) infections: Secondary | ICD-10-CM | POA: Insufficient documentation

## 2012-12-23 DIAGNOSIS — R21 Rash and other nonspecific skin eruption: Secondary | ICD-10-CM | POA: Insufficient documentation

## 2012-12-23 DIAGNOSIS — Z87448 Personal history of other diseases of urinary system: Secondary | ICD-10-CM | POA: Insufficient documentation

## 2012-12-23 DIAGNOSIS — F17200 Nicotine dependence, unspecified, uncomplicated: Secondary | ICD-10-CM | POA: Insufficient documentation

## 2012-12-23 DIAGNOSIS — Z79899 Other long term (current) drug therapy: Secondary | ICD-10-CM | POA: Insufficient documentation

## 2012-12-23 MED ORDER — DIPHENHYDRAMINE HCL 25 MG PO TABS: 25.0000 mg | ORAL_TABLET | Freq: Four times a day (QID) | ORAL | Status: DC

## 2012-12-23 MED ORDER — DIPHENHYDRAMINE HCL 25 MG PO CAPS
25.0000 mg | ORAL_CAPSULE | Freq: Once | ORAL | Status: AC
  Administered 2012-12-23: 25 mg via ORAL
  Filled 2012-12-23: qty 1

## 2012-12-23 MED ORDER — FAMOTIDINE 20 MG PO TABS
20.0000 mg | ORAL_TABLET | Freq: Once | ORAL | Status: AC
  Administered 2012-12-23: 20 mg via ORAL
  Filled 2012-12-23: qty 1

## 2012-12-23 MED ORDER — PREDNISONE 10 MG PO TABS: 20.0000 mg | ORAL_TABLET | Freq: Every day | ORAL | Status: DC

## 2012-12-23 MED ORDER — FAMOTIDINE 20 MG PO TABS: 20.0000 mg | ORAL_TABLET | Freq: Two times a day (BID) | ORAL | Status: DC

## 2012-12-23 MED ORDER — PREDNISONE 20 MG PO TABS
60.0000 mg | ORAL_TABLET | Freq: Once | ORAL | Status: AC
  Administered 2012-12-23: 60 mg via ORAL
  Filled 2012-12-23: qty 3

## 2012-12-23 NOTE — ED Notes (Signed)
Meds given, discharge inst given, voiced understanding.  Signature pad would not work

## 2012-12-23 NOTE — ED Provider Notes (Signed)
Medical screening examination/treatment/procedure(s) were performed by non-physician practitioner and as supervising physician I was immediately available for consultation/collaboration.  Fuad Forget L Luvenia Cranford, MD 12/23/12 2312 

## 2012-12-23 NOTE — ED Notes (Signed)
Patient presents with rash which started on Thursday (started on the inner thigh of her right leg) and has progressed on her entire body

## 2012-12-23 NOTE — ED Notes (Signed)
Pt in c/o generalized rash since Thursday, states symptoms have worsened over the last few days

## 2012-12-23 NOTE — ED Provider Notes (Signed)
CSN: [id number]     Arrival date & time 12/23/12  1856 History  This chart was scribed for non-physician practitioner Fayrene Helper, PA-C working with Flint Melter, MD by Valera Castle, ED scribe. This patient was seen in room TR07C/TR07C and the patient's care was started at 7:11 PM.     Chief Complaint  Patient presents with  . Rash    The history is provided by the patient. No language interpreter was used.   HPI Comments: Kristin Kemp is a 22 y.o. female who presents to the Emergency Department complaining of an itchy generalized rash, onset 2 days. Pt states the rash started on her legs, but has spread. Pt states the symptoms have worsened over the last few days. She has taken Benadryl, with no relief. She is allergic to chocolate and cinnamon. Pt states the rash may have been caused by a new detergent when she was staying over a friend's house. Pt denies SOB, sore throat, fever, wheezing, trouble swallowing. Pt denies taking any new medication, no new pets. Pt denies anybody around her having the same rash.   Past Medical History  Diagnosis Date  . Urinary tract infection   . Ovarian cyst   . Abnormal Pap smear   . Herpes   . Late prenatal care    Past Surgical History  Procedure Laterality Date  . Dilation and curettage of uterus  2009   Family History  Problem Relation Age of Onset  . Hypertension Maternal Grandmother   . Hyperlipidemia Maternal Grandmother   . Heart disease Maternal Grandmother     enlarged heart  . Anesthesia problems Neg Hx   . Diabetes Maternal Uncle   . Hypertension Maternal Uncle   . Kidney disease Maternal Uncle   . Cancer Paternal Aunt     lung  . Cancer Paternal Uncle     lung  . Diabetes Maternal Grandfather    History  Substance Use Topics  . Smoking status: Current Every Day Smoker -- 0.30 packs/day    Types: Cigarettes    Last Attempt to Quit: 10/21/2010  . Smokeless tobacco: Never Used  . Alcohol Use: No     Comment:  occasionally   OB History   Grav Para Term Preterm Abortions TAB SAB Ect Mult Living   2 1 1  1  1   0 1     Review of Systems  Constitutional: Negative for fever.  HENT: Negative for trouble swallowing.   Respiratory: Negative for shortness of breath and wheezing.   Skin: Positive for rash.  All other systems reviewed and are negative.    Allergies  Chocolate; Cinnamon; and Latex  Home Medications   Current Outpatient Rx  Name  Route  Sig  Dispense  Refill  . Hyprom-Naphaz-Polysorb-Zn Sulf (CLEAR EYES COMPLETE OP)   Both Eyes   Place 2 drops into both eyes as needed (irration).         Marland Kitchen ibuprofen (ADVIL,MOTRIN) 800 MG tablet   Oral   Take 1 tablet (800 mg total) by mouth 3 (three) times daily.   21 tablet   0   . levonorgestrel (MIRENA) 20 MCG/24HR IUD   Intrauterine   1 each by Intrauterine route once.         . metroNIDAZOLE (FLAGYL) 500 MG tablet   Oral   Take 1 tablet (500 mg total) by mouth 2 (two) times daily.   14 tablet   0    BP 112/60  Pulse  84  Temp(Src) 98.1 F (36.7 C) (Oral)  Resp 20  Wt 115 lb (52.164 kg)  BMI 20.38 kg/m2  SpO2 100% Physical Exam  Nursing note and vitals reviewed. Constitutional: She is oriented to person, place, and time. She appears well-developed and well-nourished.  HENT:  Head: Normocephalic and atraumatic.  No mucosa involvement. Throat is normal.  Eyes: EOM are normal.  Neck: Normal range of motion. Neck supple.  NO lymphadenopathy  Cardiovascular: Normal rate, regular rhythm and normal heart sounds.   No murmur heard. Pulmonary/Chest: Effort normal.  Musculoskeletal: Normal range of motion.  Neurological: She is alert and oriented to person, place, and time.  Skin: Skin is warm and dry.  Maculopapular rash noted to all four extremities and back. No rash on the palms of hands or soles of feet.  Psychiatric: She has a normal mood and affect. Her behavior is normal.    ED Course  Procedures (including  critical care time)  DIAGNOSTIC STUDIES: Oxygen Saturation is 100% on room air, normal by my interpretation.    COORDINATION OF CARE: 7:23 PM-Discussed treatment plan which includes benadryl, pepsid, and steroids for the rash with pt at bedside and pt agreed to plan. Advised patient to come back to the ER if she develops, fever, throat swelling, wheezing, or trouble breathing.      Labs Review Labs Reviewed - No data to display Imaging Review No results found.  MDM   1. Allergic reaction, initial encounter   2. Rash    BP 112/60  Pulse 84  Temp(Src) 98.1 F (36.7 C) (Oral)  Resp 20  Wt 115 lb (52.164 kg)  BMI 20.38 kg/m2  SpO2 100%   I personally performed the services described in this documentation, which was scribed in my presence. The recorded information has been reviewed and is accurate.      Fayrene Helper, PA-C 12/23/12 1929

## 2013-02-22 ENCOUNTER — Encounter (HOSPITAL_COMMUNITY): Payer: Self-pay | Admitting: Emergency Medicine

## 2013-02-22 ENCOUNTER — Emergency Department (HOSPITAL_COMMUNITY)
Admission: EM | Admit: 2013-02-22 | Discharge: 2013-02-22 | Disposition: A | Discharge status: Home / Self Care | Payer: Medicaid Other | Attending: Emergency Medicine | Admitting: Emergency Medicine

## 2013-02-22 DIAGNOSIS — F17200 Nicotine dependence, unspecified, uncomplicated: Secondary | ICD-10-CM | POA: Insufficient documentation

## 2013-02-22 DIAGNOSIS — R011 Cardiac murmur, unspecified: Secondary | ICD-10-CM | POA: Insufficient documentation

## 2013-02-22 DIAGNOSIS — Z8619 Personal history of other infectious and parasitic diseases: Secondary | ICD-10-CM | POA: Insufficient documentation

## 2013-02-22 DIAGNOSIS — R21 Rash and other nonspecific skin eruption: Secondary | ICD-10-CM | POA: Insufficient documentation

## 2013-02-22 DIAGNOSIS — Z87440 Personal history of urinary (tract) infections: Secondary | ICD-10-CM | POA: Insufficient documentation

## 2013-02-22 DIAGNOSIS — Z8742 Personal history of other diseases of the female genital tract: Secondary | ICD-10-CM | POA: Insufficient documentation

## 2013-02-22 DIAGNOSIS — Z91040 Latex allergy status: Secondary | ICD-10-CM | POA: Insufficient documentation

## 2013-02-22 MED ORDER — DIPHENHYDRAMINE HCL 25 MG PO TABS: 25.0000 mg | ORAL_TABLET | Freq: Four times a day (QID) | ORAL | Status: DC

## 2013-02-22 MED ORDER — TRIAMCINOLONE ACETONIDE 0.1 % EX CREA: TOPICAL_CREAM | Freq: Two times a day (BID) | CUTANEOUS | Status: DC

## 2013-02-22 NOTE — ED Provider Notes (Signed)
CSN: [id number]     Arrival date & time 02/22/13  4098 History  This chart was scribed for Jaynie Crumble, PA working with Hurman Horn, MD by Quintella Reichert, ED Scribe. This patient was seen in room TR07C/TR07C and the patient's care was started at 9:23 AM.   Chief Complaint  Patient presents with  . Rash    The history is provided by the patient. No language interpreter was used.    HPI Comments: Kristin Kemp is a 22 y.o. female who presents to the Emergency Department complaining of mild, gradually-worsening, painful, itchy, swollen bumps that she first noticed yesterday on her right ear and which have gradually spread to the right side of her neck, right upper back, and both arms.  Pt also states that her right ear feels swollen.  She denies rash to any other area.  She denies any known insect bites, new clothes, or any other known exposures that may have caused symptoms.   She notes that her 61-month-old daughter at her home does not have similar symptoms.  She applied "[place] Bitter" to the areas which temporarily reduced swelling.  She has not taken any other medications pta. Pt denies prior h/o similar symptoms.     Past Medical History  Diagnosis Date  . Urinary tract infection   . Ovarian cyst   . Abnormal Pap smear   . Herpes   . Late prenatal care     Past Surgical History  Procedure Laterality Date  . Dilation and curettage of uterus  2009    Family History  Problem Relation Age of Onset  . Hypertension Maternal Grandmother   . Hyperlipidemia Maternal Grandmother   . Heart disease Maternal Grandmother     enlarged heart  . Anesthesia problems Neg Hx   . Diabetes Maternal Uncle   . Hypertension Maternal Uncle   . Kidney disease Maternal Uncle   . Cancer Paternal Aunt     lung  . Cancer Paternal Uncle     lung  . Diabetes Maternal Grandfather     History  Substance Use Topics  . Smoking status: Current Every Day Smoker -- 0.30 packs/day   Types: Cigarettes    Last Attempt to Quit: 10/21/2010  . Smokeless tobacco: Never Used  . Alcohol Use: No     Comment: occasionally    OB History   Grav Para Term Preterm Abortions TAB SAB Ect Mult Living   2 1 1  1  1   0 1      Review of Systems  Constitutional: Negative for fever.  HENT: Positive for facial swelling (right ear).   Skin: Positive for rash.     Allergies  Chocolate; Cinnamon; and Latex  Home Medications  No current outpatient prescriptions on file.  BP 118/68  Pulse 89  Temp(Src) 97.7 F (36.5 C) (Oral)  Resp 18  SpO2 100%  Physical Exam  Nursing note and vitals reviewed. Constitutional: She is oriented to person, place, and time. She appears well-developed and well-nourished. No distress.  HENT:  Head: Normocephalic and atraumatic.  Eyes: EOM are normal.  Neck: Neck supple. No tracheal deviation present.  Cardiovascular: Normal rate.   Pulmonary/Chest: Effort normal. No respiratory distress.  Musculoskeletal: Normal range of motion.  Neurological: She is alert and oriented to person, place, and time.  Skin: Skin is warm and dry. Rash noted.  Numerous erythematous papules to the neck, upper back and upper bilateral arms Papule to the right ear with surrounding  swelling.  No drainage, no surrounding erythema.  Psychiatric: She has a normal mood and affect. Her behavior is normal.    ED Course  Procedures (including critical care time)  DIAGNOSTIC STUDIES: Oxygen Saturation is 100% on room air, normal by my interpretation.    COORDINATION OF CARE: 9:29 AM-Discussed treatment plan which includes Benadryl with pt at bedside and pt agreed to plan.    Labs Review Labs Reviewed - No data to display  Imaging Review No results found.  EKG Interpretation   None       MDM   1. Rash     Patient in emergency department with a rash that is suspicious for an insect bites. It is possible that this is bedbugs. She has not had any exposure  to any other insects. I will treat her with a steroid ointment for itching and inflammation, as well as Benadryl. Followup with primary care Dr. as needed.  Filed Vitals:   02/22/13 0901  BP: 118/68  Pulse: 89  Temp: 97.7 F (36.5 C)  TempSrc: Oral  Resp: 18  SpO2: 100%    I personally performed the services described in this documentation, which was scribed in my presence. The recorded information has been reviewed and is accurate.    Lottie Mussel, PA-C 02/22/13 1515

## 2013-02-22 NOTE — ED Notes (Signed)
Pt with what appears to be insect bites to R upper arm and R neck since yesterday.

## 2013-02-25 NOTE — ED Provider Notes (Signed)
Medical screening examination/treatment/procedure(s) were performed by non-physician practitioner and as supervising physician I was immediately available for consultation/collaboration.  Zavannah Deblois M Adrianne Shackleton, MD 02/25/13 1847 

## 2013-07-08 ENCOUNTER — Emergency Department (HOSPITAL_COMMUNITY): Payer: No Typology Code available for payment source

## 2013-07-08 ENCOUNTER — Encounter (HOSPITAL_COMMUNITY): Payer: Self-pay | Admitting: Emergency Medicine

## 2013-07-08 ENCOUNTER — Emergency Department (HOSPITAL_COMMUNITY)
Admission: EM | Admit: 2013-07-08 | Discharge: 2013-07-08 | Disposition: A | Discharge status: Home / Self Care | Payer: Self-pay | Attending: Emergency Medicine | Admitting: Emergency Medicine

## 2013-07-08 DIAGNOSIS — R1110 Vomiting, unspecified: Secondary | ICD-10-CM | POA: Insufficient documentation

## 2013-07-08 DIAGNOSIS — IMO0002 Reserved for concepts with insufficient information to code with codable children: Secondary | ICD-10-CM | POA: Insufficient documentation

## 2013-07-08 DIAGNOSIS — Z79899 Other long term (current) drug therapy: Secondary | ICD-10-CM | POA: Insufficient documentation

## 2013-07-08 DIAGNOSIS — R51 Headache: Secondary | ICD-10-CM | POA: Insufficient documentation

## 2013-07-08 DIAGNOSIS — Z87440 Personal history of urinary (tract) infections: Secondary | ICD-10-CM | POA: Insufficient documentation

## 2013-07-08 DIAGNOSIS — Z872 Personal history of diseases of the skin and subcutaneous tissue: Secondary | ICD-10-CM | POA: Insufficient documentation

## 2013-07-08 DIAGNOSIS — J40 Bronchitis, not specified as acute or chronic: Secondary | ICD-10-CM | POA: Insufficient documentation

## 2013-07-08 DIAGNOSIS — Z91040 Latex allergy status: Secondary | ICD-10-CM | POA: Insufficient documentation

## 2013-07-08 DIAGNOSIS — F17200 Nicotine dependence, unspecified, uncomplicated: Secondary | ICD-10-CM | POA: Insufficient documentation

## 2013-07-08 DIAGNOSIS — R509 Fever, unspecified: Secondary | ICD-10-CM | POA: Insufficient documentation

## 2013-07-08 DIAGNOSIS — Z8742 Personal history of other diseases of the female genital tract: Secondary | ICD-10-CM | POA: Insufficient documentation

## 2013-07-08 MED ORDER — ACETAMINOPHEN 325 MG PO TABS
650.0000 mg | ORAL_TABLET | Freq: Once | ORAL | Status: AC
  Administered 2013-07-08: 650 mg via ORAL
  Filled 2013-07-08: qty 2

## 2013-07-08 MED ORDER — BENZONATATE 100 MG PO CAPS
200.0000 mg | ORAL_CAPSULE | Freq: Three times a day (TID) | ORAL | Status: DC | PRN
  Administered 2013-07-08: 200 mg via ORAL
  Filled 2013-07-08: qty 2

## 2013-07-08 MED ORDER — PREDNISONE 20 MG PO TABS: 60.0000 mg | ORAL_TABLET | Freq: Every day | ORAL | Status: DC

## 2013-07-08 MED ORDER — PREDNISONE 20 MG PO TABS
60.0000 mg | ORAL_TABLET | Freq: Once | ORAL | Status: AC
  Administered 2013-07-08: 60 mg via ORAL
  Filled 2013-07-08: qty 3

## 2013-07-08 MED ORDER — IPRATROPIUM BROMIDE 0.02 % IN SOLN
0.5000 mg | Freq: Once | RESPIRATORY_TRACT | Status: AC
  Administered 2013-07-08: 0.5 mg via RESPIRATORY_TRACT
  Filled 2013-07-08: qty 2.5

## 2013-07-08 MED ORDER — ALBUTEROL SULFATE HFA 108 (90 BASE) MCG/ACT IN AERS: 1.0000 | INHALATION_SPRAY | Freq: Four times a day (QID) | RESPIRATORY_TRACT | Status: DC | PRN

## 2013-07-08 MED ORDER — BENZONATATE 100 MG PO CAPS: 100.0000 mg | ORAL_CAPSULE | Freq: Three times a day (TID) | ORAL | Status: DC

## 2013-07-08 MED ORDER — ALBUTEROL SULFATE HFA 108 (90 BASE) MCG/ACT IN AERS
1.0000 | INHALATION_SPRAY | RESPIRATORY_TRACT | Status: DC | PRN
  Administered 2013-07-08: 2 via RESPIRATORY_TRACT
  Filled 2013-07-08: qty 6.7

## 2013-07-08 MED ORDER — ALBUTEROL SULFATE (2.5 MG/3ML) 0.083% IN NEBU
5.0000 mg | INHALATION_SOLUTION | Freq: Once | RESPIRATORY_TRACT | Status: AC
  Administered 2013-07-08: 5 mg via RESPIRATORY_TRACT
  Filled 2013-07-08: qty 6

## 2013-07-08 MED ORDER — HYDROCOD POLST-CHLORPHEN POLST 10-8 MG/5ML PO LQCR: 5.0000 mL | Freq: Two times a day (BID) | ORAL | Status: DC | PRN

## 2013-07-08 NOTE — ED Notes (Signed)
Patient transported to X-ray 

## 2013-07-08 NOTE — Discharge Instructions (Signed)
Take medications as prescribed.  Followup with your doctor for recheck in 3-5 days.  Return to the emergency room for worsening condition or new concerning symptoms.   Bronchitis Bronchitis is inflammation of the airways that extend from the windpipe into the lungs (bronchi). The inflammation often causes mucus to develop, which leads to a cough. If the inflammation becomes severe, it may cause shortness of breath. CAUSES  Bronchitis may be caused by:   Viral infections.   Bacteria.   Cigarette smoke.   Allergens, pollutants, and other irritants.  SIGNS AND SYMPTOMS  The most common symptom of bronchitis is a frequent cough that produces mucus. Other symptoms include:  Fever.   Body aches.   Chest congestion.   Chills.   Shortness of breath.   Sore throat.  DIAGNOSIS  Bronchitis is usually diagnosed through a medical history and physical exam. Tests, such as chest X-rays, are sometimes done to rule out other conditions.  TREATMENT  You may need to avoid contact with whatever caused the problem (smoking, for example). Medicines are sometimes needed. These may include:  Antibiotics. These may be prescribed if the condition is caused by bacteria.  Cough suppressants. These may be prescribed for relief of cough symptoms.   Inhaled medicines. These may be prescribed to help open your airways and make it easier for you to breathe.   Steroid medicines. These may be prescribed for those with recurrent (chronic) bronchitis. HOME CARE INSTRUCTIONS  Get plenty of rest.   Drink enough fluids to keep your urine clear or pale yellow (unless you have a medical condition that requires fluid restriction). Increasing fluids may help thin your secretions and will prevent dehydration.   Only take over-the-counter or prescription medicines as directed by your health care provider.  Only take antibiotics as directed. Make sure you finish them even if you start to feel  better.  Avoid secondhand smoke, irritating chemicals, and strong fumes. These will make bronchitis worse. If you are a smoker, quit smoking. Consider using nicotine gum or skin patches to help control withdrawal symptoms. Quitting smoking will help your lungs heal faster.   Put a cool-mist humidifier in your bedroom at night to moisten the air. This may help loosen mucus. Change the water in the humidifier daily. You can also run the hot water in your shower and sit in the bathroom with the door closed for 5 10 minutes.   Follow up with your health care provider as directed.   Wash your hands frequently to avoid catching bronchitis again or spreading an infection to others.  SEEK MEDICAL CARE IF: Your symptoms do not improve after 1 week of treatment.  SEEK IMMEDIATE MEDICAL CARE IF:  Your fever increases.  You have chills.   You have chest pain.   You have worsening shortness of breath.   You have bloody sputum.  You faint.  You have lightheadedness.  You have a severe headache.   You vomit repeatedly. MAKE SURE YOU:   Understand these instructions.  Will watch your condition.  Will get help right away if you are not doing well or get worse. Document Released: 04/12/2005 Document Revised: 01/31/2013 Document Reviewed: 12/05/2012 [facility] Patient Information 2014 [place]Care, [place].

## 2013-07-08 NOTE — ED Notes (Signed)
Pt reports cough x 4days and has felt like she has been wheezing today. Pt also reports vomiting yesterday but feels better now, pt denies nausea at this time. Pt reports headache x 1day.

## 2013-07-09 ENCOUNTER — Other Ambulatory Visit: Payer: Self-pay | Admitting: *Deleted

## 2013-07-09 NOTE — ED Provider Notes (Signed)
CSN: [id number]     Arrival date & time 07/08/13  0246 History   First MD Initiated Contact with Patient 07/08/13 0402     Chief Complaint  Patient presents with  . Cough  . Headache     (Consider location/radiation/quality/duration/timing/severity/associated sxs/prior Treatment) HPI 23 year old female presents to emergency room from home with complaint of worsening cough over the last 4 days.  Patient had posttussive emesis yesterday.  She has had subjective fevers and chills.  No nausea currently.  She has had some headache.  She has been taking over-the-counter cough and cold medicine.  She is a smoker.  She denies previous history of asthma or bronchitis. Past Medical History  Diagnosis Date  . Urinary tract infection   . Ovarian cyst   . Abnormal Pap smear   . Herpes   . Late prenatal care    Past Surgical History  Procedure Laterality Date  . Dilation and curettage of uterus  2009   Family History  Problem Relation Age of Onset  . Hypertension Maternal Grandmother   . Hyperlipidemia Maternal Grandmother   . Heart disease Maternal Grandmother     enlarged heart  . Anesthesia problems Neg Hx   . Diabetes Maternal Uncle   . Hypertension Maternal Uncle   . Kidney disease Maternal Uncle   . Cancer Paternal Aunt     lung  . Cancer Paternal Uncle     lung  . Diabetes Maternal Grandfather    History  Substance Use Topics  . Smoking status: Current Every Day Smoker -- 0.25 packs/day    Types: Cigarettes    Last Attempt to Quit: 10/21/2010  . Smokeless tobacco: Never Used  . Alcohol Use: Yes     Comment: occasionally   OB History   Grav Para Term Preterm Abortions TAB SAB Ect Mult Living   2 1 1  1  1   0 1     Review of Systems   See History of Present Illness; otherwise all other systems are reviewed and negative  Allergies  Chocolate; Cinnamon; and Latex  Home Medications   Current Outpatient Rx  Name  Route  Sig  Dispense  Refill  .  Phenylephrine-Pheniramine-DM (THERAFLU COLD & COUGH PO)   Oral   Take 1 packet by mouth daily as needed (for cough).         . Pseudoephedrine-DM-GG (ROBITUSSIN COUGH/COLD D PO)   Oral   Take 30 mLs by mouth 2 (two) times daily as needed (for cough).         Marland Kitchen. albuterol (PROVENTIL HFA;VENTOLIN HFA) 108 (90 BASE) MCG/ACT inhaler   Inhalation   Inhale 1-2 puffs into the lungs every 6 (six) hours as needed for wheezing or shortness of breath.   1 Inhaler   0   . benzonatate (TESSALON) 100 MG capsule   Oral   Take 1 capsule (100 mg total) by mouth every 8 (eight) hours.   21 capsule   0   . chlorpheniramine-HYDROcodone (TUSSIONEX PENNKINETIC ER) 10-8 MG/5ML LQCR   Oral   Take 5 mLs by mouth every 12 (twelve) hours as needed for cough.   140 mL   0   . predniSONE (DELTASONE) 20 MG tablet   Oral   Take 3 tablets (60 mg total) by mouth daily.   15 tablet   0    BP 106/48  Pulse 105  Temp(Src) 97.5 F (36.4 C) (Oral)  Resp 20  SpO2 93% Physical Exam  Nursing  note and vitals reviewed. Constitutional: She is oriented to person, place, and time. She appears well-developed and well-nourished. She appears distressed (uncomfortable appearing).  HENT:  Head: Normocephalic and atraumatic.  Right Ear: External ear normal.  Left Ear: External ear normal.  Nose: Nose normal.  Mouth/Throat: Oropharynx is clear and moist.  Eyes: Conjunctivae and EOM are normal. Pupils are equal, round, and reactive to light.  Neck: Normal range of motion. Neck supple. No JVD present. No tracheal deviation present. No thyromegaly present.  Cardiovascular: Normal rate, regular rhythm, normal heart sounds and intact distal pulses.  Exam reveals no gallop and no friction rub.   No murmur heard. Pulmonary/Chest: Effort normal. No stridor. No respiratory distress. She has wheezes (mild end expiratory wheeze, and coughing). She has no rales. She exhibits no tenderness.  Abdominal: Soft. Bowel sounds are  normal. She exhibits no distension and no mass. There is no tenderness. There is no rebound and no guarding.  Musculoskeletal: Normal range of motion. She exhibits no edema and no tenderness.  Lymphadenopathy:    She has no cervical adenopathy.  Neurological: She is alert and oriented to person, place, and time. She exhibits normal muscle tone. Coordination normal.  Skin: Skin is warm and dry. No rash noted. No pallor.  Psychiatric: She has a normal mood and affect. Her behavior is normal. Judgment and thought content normal.    ED Course  Procedures (including critical care time) Labs Review Labs Reviewed - No data to display Imaging Review Dg Chest 2 View  07/08/2013   CLINICAL DATA:  Cough and fever  EXAM: CHEST  2 VIEW  COMPARISON:  11/18/2011  FINDINGS: The heart size and mediastinal contours are within normal limits. Both lungs are clear. The visualized skeletal structures are unremarkable.  IMPRESSION: No active cardiopulmonary disease.   Electronically Signed   By: Marlan Palau M.D.   On: 07/08/2013 04:06     EKG Interpretation None      MDM   Final diagnoses:  Bronchitis    23 year old female with clinical bronchitis.  Will treat symptoms.  Patient advised to she is to stop smoking.      Olivia Mackie, MD 07/09/13 [phone]

## 2013-07-11 ENCOUNTER — Emergency Department (HOSPITAL_COMMUNITY)
Admission: EM | Admit: 2013-07-11 | Discharge: 2013-07-11 | Disposition: A | Discharge status: Home / Self Care | Payer: No Typology Code available for payment source | Attending: Emergency Medicine | Admitting: Emergency Medicine

## 2013-07-11 ENCOUNTER — Encounter (HOSPITAL_COMMUNITY): Payer: Self-pay | Admitting: Emergency Medicine

## 2013-07-11 DIAGNOSIS — F17200 Nicotine dependence, unspecified, uncomplicated: Secondary | ICD-10-CM | POA: Insufficient documentation

## 2013-07-11 DIAGNOSIS — Y9389 Activity, other specified: Secondary | ICD-10-CM | POA: Insufficient documentation

## 2013-07-11 DIAGNOSIS — Z91040 Latex allergy status: Secondary | ICD-10-CM | POA: Insufficient documentation

## 2013-07-11 DIAGNOSIS — Z87440 Personal history of urinary (tract) infections: Secondary | ICD-10-CM | POA: Insufficient documentation

## 2013-07-11 DIAGNOSIS — Z8742 Personal history of other diseases of the female genital tract: Secondary | ICD-10-CM | POA: Insufficient documentation

## 2013-07-11 DIAGNOSIS — Y92410 Unspecified street and highway as the place of occurrence of the external cause: Secondary | ICD-10-CM | POA: Insufficient documentation

## 2013-07-11 DIAGNOSIS — IMO0002 Reserved for concepts with insufficient information to code with codable children: Secondary | ICD-10-CM | POA: Insufficient documentation

## 2013-07-11 DIAGNOSIS — Z8619 Personal history of other infectious and parasitic diseases: Secondary | ICD-10-CM | POA: Insufficient documentation

## 2013-07-11 DIAGNOSIS — Z79899 Other long term (current) drug therapy: Secondary | ICD-10-CM | POA: Insufficient documentation

## 2013-07-11 MED ORDER — KETOROLAC TROMETHAMINE 60 MG/2ML IM SOLN
60.0000 mg | Freq: Once | INTRAMUSCULAR | Status: AC
  Administered 2013-07-11: 60 mg via INTRAMUSCULAR
  Filled 2013-07-11: qty 2

## 2013-07-11 MED ORDER — CYCLOBENZAPRINE HCL 10 MG PO TABS: 10.0000 mg | ORAL_TABLET | Freq: Two times a day (BID) | ORAL | Status: DC | PRN

## 2013-07-11 MED ORDER — HYDROCODONE-ACETAMINOPHEN 5-325 MG PO TABS: 1.0000 | ORAL_TABLET | Freq: Four times a day (QID) | ORAL | Status: DC | PRN
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
## 2013-07-11 NOTE — ED Notes (Signed)
PT ambulated with baseline gait; VSS; A&Ox3; no signs of distress; respirations even and unlabored; skin warm and dry; no questions upon discharge.  

## 2013-07-11 NOTE — ED Notes (Signed)
PA at bedside.
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
## 2013-07-19 NOTE — ED Provider Notes (Signed)
Medical screening examination/treatment/procedure(s) were performed by non-physician practitioner and as supervising physician I was immediately available for consultation/collaboration.   EKG Interpretation None        Enid SkeensJoshua M Saniyah Mondesir, MD 07/19/13 1236

## 2013-09-06 ENCOUNTER — Ambulatory Visit (INDEPENDENT_AMBULATORY_CARE_PROVIDER_SITE_OTHER): Payer: No Typology Code available for payment source | Admitting: Family Medicine

## 2013-09-06 ENCOUNTER — Encounter: Payer: Self-pay | Admitting: Family Medicine

## 2013-09-06 DIAGNOSIS — Z1329 Encounter for screening for other suspected endocrine disorder: Secondary | ICD-10-CM

## 2013-09-06 DIAGNOSIS — Z111 Encounter for screening for respiratory tuberculosis: Secondary | ICD-10-CM

## 2013-09-06 DIAGNOSIS — Z13228 Encounter for screening for other metabolic disorders: Secondary | ICD-10-CM

## 2013-09-06 DIAGNOSIS — Z130 Encounter for screening for diseases of the blood and blood-forming organs and certain disorders involving the immune mechanism: Secondary | ICD-10-CM

## 2013-09-06 MED ORDER — TUBERCULIN PPD 5 UNIT/0.1ML ID SOLN
5.0000 [IU] | Freq: Once | INTRADERMAL | Status: AC
  Administered 2013-09-06: 5 [IU] via INTRADERMAL

## 2013-09-06 NOTE — Progress Notes (Signed)
   Subjective:    Patient ID: Kristin Kemp, female    DOB: [birth year], 23 y.o.   MRN: [id number]  HPI umf Tuberculosis Risk Questionnaire  1. No Were you born outside the [place] in one of the following parts of the world: [place], [place], [place], [place] or [place]?    2. No Have you traveled outside the [place] and lived for more than one month in one of the following parts of the world: [place], [place], [place], [place] or [place]?    3. No Do you have a compromised immune system such as from any of the following conditions:HIV/AIDS, organ or bone marrow transplantation, diabetes, immunosuppressive medicines (e.g. Prednisone, Remicaide), leukemia, lymphoma, cancer of the head or neck, gastrectomy or jejunal bypass, end-stage renal disease (on dialysis), or silicosis?     4. Yes Oak Education officer, environmentalwood assistant living Have you ever or do you plan on working in: a residential care center, a health care facility, a jail or prison or homeless shelter?    5. No Have you ever: injected illegal drugs, used crack cocaine, lived in a homeless shelter  or been in jail or prison?     6. No Have you ever been exposed to anyone with infectious tuberculosis?    Tuberculosis Symptom Questionnaire  Do you currently have any of the following symptoms?  1. No Unexplained cough lasting more than 3 weeks?   2. No Unexplained fever lasting more than 3 weeks.   3. No Night Sweats (sweating that leaves the bedclothes and sheets wet)     4. No Shortness of Breath   5. No Chest Pain   6. No Unintentional weight loss    7. 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  Signed, Elvina SidleKurt  Lauenstein

## 2013-09-10 ENCOUNTER — Encounter (INDEPENDENT_AMBULATORY_CARE_PROVIDER_SITE_OTHER): Payer: No Typology Code available for payment source | Admitting: *Deleted

## 2013-09-10 DIAGNOSIS — Z111 Encounter for screening for respiratory tuberculosis: Secondary | ICD-10-CM

## 2013-12-18 ENCOUNTER — Telehealth: Payer: Self-pay

## 2013-12-18 ENCOUNTER — Ambulatory Visit (INDEPENDENT_AMBULATORY_CARE_PROVIDER_SITE_OTHER): Payer: No Typology Code available for payment source | Admitting: Internal Medicine

## 2013-12-18 DIAGNOSIS — Z111 Encounter for screening for respiratory tuberculosis: Secondary | ICD-10-CM

## 2013-12-20 ENCOUNTER — Ambulatory Visit (INDEPENDENT_AMBULATORY_CARE_PROVIDER_SITE_OTHER): Payer: Self-pay | Admitting: Radiology

## 2013-12-20 DIAGNOSIS — Z111 Encounter for screening for respiratory tuberculosis: Secondary | ICD-10-CM

## 2013-12-20 LAB — TB SKIN TEST
INDURATION: 0 mm
TB SKIN TEST: NEGATIVE

## 2013-12-20 NOTE — Progress Notes (Signed)
   Subjective:    Patient ID: Kristin Kemp, female    DOB: [birth year], 23 y.o.   MRN: [id number]  HPI   The patient present to the office for TB reading  - was placed on 12/21/2013 at 8:38 AM. The result is negative , with 0 mm induration. Review of Systems     Objective:   Physical Exam        Assessment & Plan:

## 2013-12-21 ENCOUNTER — Encounter: Payer: Self-pay | Admitting: Internal Medicine

## 2013-12-21 NOTE — Progress Notes (Signed)
   Subjective:    Patient ID: Kristin Kemp, female    DOB: January 30, 1991, 23 y.o.   MRN: [id number]  HPI Here for two step PPD   Review of Systems     Objective:   Physical Exam        Assessment & Plan:  Apply 2nd TB test Read 48-72 hrs.

## 2014-01-08 NOTE — Telephone Encounter (Signed)
Opened in error

## 2014-01-11 ENCOUNTER — Encounter (HOSPITAL_COMMUNITY): Payer: Self-pay | Admitting: Emergency Medicine

## 2014-01-11 ENCOUNTER — Emergency Department (HOSPITAL_COMMUNITY): Payer: Medicaid Other

## 2014-01-11 ENCOUNTER — Inpatient Hospital Stay (HOSPITAL_COMMUNITY)
Admission: EM | Admit: 2014-01-11 | Discharge: 2014-01-13 | DRG: 758 | Disposition: A | Discharge status: Home / Self Care | Payer: Medicaid Other | Attending: Obstetrics and Gynecology | Admitting: Obstetrics and Gynecology

## 2014-01-11 DIAGNOSIS — F17200 Nicotine dependence, unspecified, uncomplicated: Secondary | ICD-10-CM | POA: Diagnosis present

## 2014-01-11 DIAGNOSIS — Z833 Family history of diabetes mellitus: Secondary | ICD-10-CM | POA: Diagnosis not present

## 2014-01-11 DIAGNOSIS — Z30432 Encounter for removal of intrauterine contraceptive device: Secondary | ICD-10-CM | POA: Diagnosis not present

## 2014-01-11 DIAGNOSIS — N739 Female pelvic inflammatory disease, unspecified: Secondary | ICD-10-CM | POA: Diagnosis present

## 2014-01-11 DIAGNOSIS — N730 Acute parametritis and pelvic cellulitis: Secondary | ICD-10-CM

## 2014-01-11 DIAGNOSIS — A5400 Gonococcal infection of lower genitourinary tract, unspecified: Secondary | ICD-10-CM | POA: Diagnosis present

## 2014-01-11 DIAGNOSIS — Z8249 Family history of ischemic heart disease and other diseases of the circulatory system: Secondary | ICD-10-CM | POA: Diagnosis not present

## 2014-01-11 DIAGNOSIS — N760 Acute vaginitis: Secondary | ICD-10-CM | POA: Diagnosis present

## 2014-01-11 DIAGNOSIS — R109 Unspecified abdominal pain: Secondary | ICD-10-CM | POA: Diagnosis present

## 2014-01-11 DIAGNOSIS — Z30431 Encounter for routine checking of intrauterine contraceptive device: Secondary | ICD-10-CM | POA: Diagnosis not present

## 2014-01-11 LAB — CBC WITH DIFFERENTIAL/PLATELET
BASOS ABS: 0 10*3/uL (ref 0.0–0.1)
Basophils Relative: 0 % (ref 0–1)
Eosinophils Absolute: 0.2 10*3/uL (ref 0.0–0.7)
Eosinophils Relative: 1 % (ref 0–5)
HEMATOCRIT: 37.6 % (ref 36.0–46.0)
HEMOGLOBIN: 13.3 g/dL (ref 12.0–15.0)
LYMPHS PCT: 8 % — AB (ref 12–46)
Lymphs Abs: 1.4 10*3/uL (ref 0.7–4.0)
MCH: 28.2 pg (ref 26.0–34.0)
MCHC: 35.4 g/dL (ref 30.0–36.0)
MCV: 79.8 fL (ref 78.0–100.0)
MONOS PCT: 4 % (ref 3–12)
Monocytes Absolute: 0.7 10*3/uL (ref 0.1–1.0)
NEUTROS ABS: 14.9 10*3/uL — AB (ref 1.7–7.7)
NEUTROS PCT: 87 % — AB (ref 43–77)
Platelets: 294 10*3/uL (ref 150–400)
RBC: 4.71 MIL/uL (ref 3.87–5.11)
RDW: 13.8 % (ref 11.5–15.5)
WBC: 17.3 10*3/uL — AB (ref 4.0–10.5)

## 2014-01-11 LAB — COMPREHENSIVE METABOLIC PANEL
ALBUMIN: 4.3 g/dL (ref 3.5–5.2)
ALK PHOS: 60 U/L (ref 39–117)
ALT: 35 U/L (ref 0–35)
AST: 22 U/L (ref 0–37)
Anion gap: 13 (ref 5–15)
BILIRUBIN TOTAL: 0.3 mg/dL (ref 0.3–1.2)
BUN: 13 mg/dL (ref 6–23)
CHLORIDE: 103 meq/L (ref 96–112)
CO2: 24 meq/L (ref 19–32)
Calcium: 9.8 mg/dL (ref 8.4–10.5)
Creatinine, Ser: 0.81 mg/dL (ref 0.50–1.10)
GFR calc Af Amer: >90 mL/min (ref >90)
GFR calc non Af Amer: >90 mL/min (ref >90)
Glucose, Bld: 95 mg/dL (ref 70–99)
Potassium: 3.8 mEq/L (ref 3.7–5.3)
Sodium: 140 mEq/L (ref 137–147)
Total Protein: 7.7 g/dL (ref 6.0–8.3)

## 2014-01-11 LAB — WET PREP, GENITAL
Trich, Wet Prep: NONE SEEN
WBC, Wet Prep HPF POC: NONE SEEN
Yeast Wet Prep HPF POC: NONE SEEN

## 2014-01-11 LAB — URINE MICROSCOPIC-ADD ON

## 2014-01-11 LAB — URINALYSIS, ROUTINE W REFLEX MICROSCOPIC
Bilirubin Urine: NEGATIVE
GLUCOSE, UA: NEGATIVE mg/dL
KETONES UR: NEGATIVE mg/dL
Nitrite: NEGATIVE
PH: 5.5 (ref 5.0–8.0)
Protein, ur: NEGATIVE mg/dL
Specific Gravity, Urine: 1.016 (ref 1.005–1.030)
Urobilinogen, UA: 0.2 mg/dL (ref 0.0–1.0)

## 2014-01-11 LAB — LIPASE, BLOOD: Lipase: 32 U/L (ref 11–59)

## 2014-01-11 LAB — PREGNANCY, URINE: Preg Test, Ur: NEGATIVE

## 2014-01-11 MED ORDER — DEXTROSE 5 % IV SOLN
2.0000 g | Freq: Four times a day (QID) | INTRAVENOUS | Status: DC
  Administered 2014-01-11 – 2014-01-13 (×8): 2 g via INTRAVENOUS
  Filled 2014-01-11 (×9): qty 2

## 2014-01-11 MED ORDER — METRONIDAZOLE 500 MG PO TABS
500.0000 mg | ORAL_TABLET | Freq: Once | ORAL | Status: AC
  Administered 2014-01-11: 500 mg via ORAL
  Filled 2014-01-11: qty 1

## 2014-01-11 MED ORDER — DOCUSATE SODIUM 100 MG PO CAPS
100.0000 mg | ORAL_CAPSULE | Freq: Two times a day (BID) | ORAL | Status: DC
  Administered 2014-01-11 – 2014-01-13 (×4): 100 mg via ORAL
  Filled 2014-01-11 (×4): qty 1

## 2014-01-11 MED ORDER — OXYCODONE-ACETAMINOPHEN 5-325 MG PO TABS
1.0000 | ORAL_TABLET | ORAL | Status: DC | PRN
  Administered 2014-01-11 – 2014-01-12 (×2): 1 via ORAL
  Filled 2014-01-11 (×2): qty 1

## 2014-01-11 MED ORDER — ACETAMINOPHEN 500 MG PO TABS
1000.0000 mg | ORAL_TABLET | Freq: Once | ORAL | Status: AC
  Administered 2014-01-11: 1000 mg via ORAL
  Filled 2014-01-11: qty 2

## 2014-01-11 MED ORDER — DOXYCYCLINE HYCLATE 100 MG PO TABS
100.0000 mg | ORAL_TABLET | Freq: Two times a day (BID) | ORAL | Status: DC
  Administered 2014-01-11 – 2014-01-13 (×4): 100 mg via ORAL
  Filled 2014-01-11 (×5): qty 1

## 2014-01-11 MED ORDER — DEXTROSE 5 % IV SOLN
2.0000 g | Freq: Once | INTRAVENOUS | Status: AC
  Administered 2014-01-11: 2 g via INTRAVENOUS
  Filled 2014-01-11: qty 2

## 2014-01-11 MED ORDER — DOXYCYCLINE HYCLATE 100 MG IV SOLR
100.0000 mg | Freq: Once | INTRAVENOUS | Status: AC
  Administered 2014-01-11: 100 mg via INTRAVENOUS
  Filled 2014-01-11: qty 100

## 2014-01-11 MED ORDER — SODIUM CHLORIDE 0.9 % IV BOLUS (SEPSIS)
1000.0000 mL | Freq: Once | INTRAVENOUS | Status: AC
  Administered 2014-01-11: 1000 mL via INTRAVENOUS

## 2014-01-11 MED ORDER — LACTATED RINGERS IV SOLN
INTRAVENOUS | Status: DC
  Administered 2014-01-11 – 2014-01-12 (×3): via INTRAVENOUS

## 2014-01-11 MED ORDER — MORPHINE SULFATE 4 MG/ML IJ SOLN
4.0000 mg | Freq: Once | INTRAMUSCULAR | Status: AC
  Administered 2014-01-11: 4 mg via INTRAVENOUS
  Filled 2014-01-11: qty 1

## 2014-01-11 MED ORDER — IBUPROFEN 600 MG PO TABS: 600.0000 mg | ORAL_TABLET | Freq: Four times a day (QID) | ORAL | Status: DC | PRN

## 2014-01-11 MED ORDER — METRONIDAZOLE 500 MG PO TABS
500.0000 mg | ORAL_TABLET | Freq: Two times a day (BID) | ORAL | Status: DC
  Administered 2014-01-11 – 2014-01-13 (×5): 500 mg via ORAL
  Filled 2014-01-11 (×5): qty 1

## 2014-01-11 MED ORDER — INFLUENZA VAC SPLIT QUAD 0.5 ML IM SUSY
0.5000 mL | PREFILLED_SYRINGE | INTRAMUSCULAR | Status: AC
  Administered 2014-01-13: 0.5 mL via INTRAMUSCULAR
  Filled 2014-01-11: qty 0.5

## 2014-01-11 NOTE — ED Notes (Signed)
Pt  Back from [place] [id number]

## 2014-01-11 NOTE — ED Notes (Signed)
Pt. reports low/mid abdominal pain with emesis , chills and body aches onset last week .

## 2014-01-11 NOTE — ED Provider Notes (Signed)
CSN: [id number]     Arrival date & time 01/11/14  0022 History   First MD Initiated Contact with Patient 01/11/14 0252     Chief Complaint  Patient presents with  . Abdominal Pain     (Consider location/radiation/quality/duration/timing/severity/associated sxs/prior Treatment) HPI Kristin Kemp is a 23 y.o. female with no significant past medical history presenting today with abdominal pain. She describes it as diffuse but worse in the bilateral lower quadrants. This has been going on for the past week. She is taken out of the which does provide her with moderate relief. The pain is sharp without any radiation. It is worse with breathing and moving. She does describe emesis as well but not always associated with food. She has chills and sweating but denies fevers at home. She does have dysuria as well. She denies any regular vaginal bleeding and she is on birth control, the Mirena.  She denies any vaginal discharge or itching. Patient has no further complaints.  10 Systems reviewed and are negative for acute change except as noted in the HPI.     Past Medical History  Diagnosis Date  . Urinary tract infection   . Ovarian cyst   . Abnormal Pap smear   . Herpes   . Late prenatal care    Past Surgical History  Procedure Laterality Date  . Dilation and curettage of uterus  2009   Family History  Problem Relation Age of Onset  . Hypertension Maternal Grandmother   . Hyperlipidemia Maternal Grandmother   . Heart disease Maternal Grandmother     enlarged heart  . Anesthesia problems Neg Hx   . Diabetes Maternal Uncle   . Hypertension Maternal Uncle   . Kidney disease Maternal Uncle   . Cancer Paternal Aunt     lung  . Cancer Paternal Uncle     lung  . Diabetes Maternal Grandfather    History  Substance Use Topics  . Smoking status: Current Every Day Smoker -- 0.25 packs/day    Types: Cigarettes    Last Attempt to Quit: 10/21/2010  . Smokeless tobacco: Never Used  .  Alcohol Use: Yes     Comment: occasionally   OB History   Grav Para Term Preterm Abortions TAB SAB Ect Mult Living   0 1     Review of Systems    Allergies  Chocolate; Cinnamon; and Latex  Home Medications   Prior to Admission medications   Medication Sig Start Date End Date Taking? Authorizing Provider  albuterol (PROVENTIL HFA;VENTOLIN HFA) 108 (90 BASE) MCG/ACT inhaler Inhale 1-2 puffs into the lungs every 6 (six) hours as needed for wheezing or shortness of breath. 07/08/13  Yes Olivia Mackie, MD  levonorgestrel (MIRENA) 20 MCG/24HR IUD 1 each by Intrauterine route once.   Yes Historical Provider, MD  naproxen sodium (ANAPROX) 220 MG tablet Take 220 mg by mouth daily as needed (pain).   Yes Historical Provider, MD   BP 113/53  Pulse 113  Temp(Src) 100.1 F (37.8 C) (Oral)  Resp 17  Ht  (1.6 m)  Wt 114 lb (51.71 kg)  BMI 20.20 kg/m2  SpO2 99% Physical Exam  Nursing note and vitals reviewed. Constitutional: She is oriented to person, place, and time. She appears well-developed and well-nourished. No distress.  HENT:  Head: Normocephalic and atraumatic.  Nose: Nose normal.  Mouth/Throat: Oropharynx is clear and moist. No oropharyngeal exudate.  Eyes: Conjunctivae and EOM  are normal. Pupils are equal, round, and reactive to light. No scleral icterus.  Neck: Normal range of motion. Neck supple. No JVD present. No tracheal deviation present. No thyromegaly present.  Cardiovascular: Normal rate, regular rhythm and normal heart sounds.  Exam reveals no gallop and no friction rub.   No murmur heard. Pulmonary/Chest: Effort normal and breath sounds normal. No respiratory distress. She has no wheezes. She exhibits no tenderness.  Abdominal: Soft. Bowel sounds are normal. She exhibits no distension and no mass. There is tenderness. There is guarding. There is no rebound.  Diffuse abdominal tenderness to palpation, worse in the left lower quadrant.  Genitourinary:  Vaginal discharge found.  Copious amounts of purulence coming from the cervical os. No CMT, positive left adnexal tenderness.  Musculoskeletal: Normal range of motion. She exhibits no edema and no tenderness.  Lymphadenopathy:    She has no cervical adenopathy.  Neurological: She is alert and oriented to person, place, and time. No cranial nerve deficit. She exhibits normal muscle tone.  Skin: Skin is warm and dry. No rash noted. She is not diaphoretic. No erythema. No pallor.    ED Course  Procedures (including critical care time) Labs Review Labs Reviewed  WET PREP, GENITAL - Abnormal; Notable for the following:    Clue Cells Wet Prep HPF POC TOO NUMEROUS TO COUNT (*)    All other components within normal limits  CBC WITH DIFFERENTIAL - Abnormal; Notable for the following:    WBC 17.3 (*)    Neutrophils Relative % 87 (*)    Neutro Abs 14.9 (*)    Lymphocytes Relative 8 (*)    All other components within normal limits  URINALYSIS, ROUTINE W REFLEX MICROSCOPIC - Abnormal; Notable for the following:    APPearance CLOUDY (*)    Hgb urine dipstick LARGE (*)    Leukocytes, UA LARGE (*)    All other components within normal limits  URINE MICROSCOPIC-ADD ON - Abnormal; Notable for the following:    Squamous Epithelial / LPF FEW (*)    Bacteria, UA FEW (*)    All other components within normal limits  GC/CHLAMYDIA PROBE AMP  COMPREHENSIVE METABOLIC PANEL  PREGNANCY, URINE  LIPASE, BLOOD    Imaging Review US Transvaginal Non-ob  01/11/2014   CLINICAL DATA:  Pelvic pain  EXAM: TRANSABDOMINAL AND TRANSVAGINAL ULTRASOUND OF PELVIS  TECHNIQUE: Both transabdominal and transvaginal ultrasound examinations of the pelvis were performed. Transabdominal technique was performed for global imaging of the pelvis including uterus, ovaries, adnexal regions, and pelvic cul-de-sac. It was necessary to proceed with endovaginal exam following the transabdominal exam to visualize the ovaries and  endometrium.  COMPARISON:  None  FINDINGS: Uterus  Measurements: 8.2 x 4.1 x 4.4 cm. No fibroids or other mass visualized.  Endometrium  Thickness: 4.2 mm.  An IUD is noted in satisfactory position.  Right ovary  Measurements: 5.4 x 2.3 x 1.7 cm. A 2.0 cm complex cystic lesion is noted within the ovary. This likely represents a hemorrhagic cyst.  Left ovary  Measurements: 2.7 x 1.6 x 1.9 cm. Normal appearance/no adnexal mass.  Other findings  No free fluid.  IMPRESSION: Complex cystic lesion within the right ovary likely representing a hemorrhagic cyst. No definitive changes of tubo-ovarian abscess are seen.  IUD in place.   Electronically Signed   By: Alcide Clever M.D.   On: 01/11/2014 07:44   US Pelvis Complete  01/11/2014   CLINICAL DATA:  Pelvic pain  EXAM: TRANSABDOMINAL AND TRANSVAGINAL  ULTRASOUND OF PELVIS  TECHNIQUE: Both transabdominal and transvaginal ultrasound examinations of the pelvis were performed. Transabdominal technique was performed for global imaging of the pelvis including uterus, ovaries, adnexal regions, and pelvic cul-de-sac. It was necessary to proceed with endovaginal exam following the transabdominal exam to visualize the ovaries and endometrium.  COMPARISON:  None  FINDINGS: Uterus  Measurements: 8.2 x 4.1 x 4.4 cm. No fibroids or other mass visualized.  Endometrium  Thickness: 4.2 mm.  An IUD is noted in satisfactory position.  Right ovary  Measurements: 5.4 x 2.3 x 1.7 cm. A 2.0 cm complex cystic lesion is noted within the ovary. This likely represents a hemorrhagic cyst.  Left ovary  Measurements: 2.7 x 1.6 x 1.9 cm. Normal appearance/no adnexal mass.  Other findings  No free fluid.  IMPRESSION: Complex cystic lesion within the right ovary likely representing a hemorrhagic cyst. No definitive changes of tubo-ovarian abscess are seen.  IUD in place.   Electronically Signed   By: Alcide Clever M.D.   On: 01/11/2014 07:44     EKG Interpretation None      MDM   Final  diagnoses:  None    Patient presents emergency department out of concern for abdominal pain. Will investigate with laboratory, urine studies, and pelvic exam. He was given morphine for pain control and IV fluids. We'll reassess.  Pelvic exam as detailed above reveal concern for TOA.  Her temperature has risen although not to a febrile level technically. Her heart rate is 120 after 1 L of fluid. I anticipate admission for pelvic inflammatory disease.  Dr. Maryan Puls agrees to transfer the patient for evaluation of PID  US shows no TOA  Tomasita Crumble, MD 01/11/14 [phone]
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
## 2014-01-11 NOTE — ED Notes (Signed)
Pelvic cart set up at bedside  

## 2014-01-11 NOTE — H&P (Signed)
Kristin Kemp is an 23 y.o. female G2P1011 who presented to the ED for evaluation of worsening abdominal pain. Patient reports onset of pain 5 days ago which worsen to the point that it was unbearable to move. She reports subjective fevers at home. Patient states she has never experiences this type of pain before. She is sexually active using Mirena for birth control.   Pertinent Gynecological History: Menses: amenorrhea secondary to Mirena IUD Bleeding: none  Contraception: IUD DES exposure: denies Blood transfusions: none Sexually transmitted diseases: denies Previous GYN Procedures: DNC  Last mammogram: n/a OB History: G2, P1011   Menstrual History: No LMP recorded. Patient has had an implant.    Past Medical History  Diagnosis Date  . Urinary tract infection   . Ovarian cyst   . Abnormal Pap smear   . Herpes   . Late prenatal care     Past Surgical History  Procedure Laterality Date  . Dilation and curettage of uterus  2009    Family History  Problem Relation Age of Onset  . Hypertension Maternal Grandmother   . Hyperlipidemia Maternal Grandmother   . Heart disease Maternal Grandmother     enlarged heart  . Anesthesia problems Neg Hx   . Diabetes Maternal Uncle   . Hypertension Maternal Uncle   . Kidney disease Maternal Uncle   . Cancer Paternal Aunt     lung  . Cancer Paternal Uncle     lung  . Diabetes Maternal Grandfather     Social History:  reports that she has been smoking Cigarettes.  She has been smoking about 0.25 packs per day. She has never used smokeless tobacco. She reports that she drinks alcohol. She reports that she does not use illicit drugs.  Allergies:  Allergies  Allergen Reactions  . Chocolate Hives and Itching  . Cinnamon Other (See Comments)    Turns tongue raw.  . Latex Hives    Prescriptions prior to admission  Medication Sig Dispense Refill  . albuterol (PROVENTIL HFA;VENTOLIN HFA) 108 (90 BASE) MCG/ACT inhaler Inhale 1-2  puffs into the lungs every 6 (six) hours as needed for wheezing or shortness of breath.  1 Inhaler  0  . levonorgestrel (MIRENA) 20 MCG/24HR IUD 1 each by Intrauterine route once.      . naproxen sodium (ANAPROX) 220 MG tablet Take 220 mg by mouth daily as needed (pain).        Review of Systems  All other systems reviewed and are negative.   Blood pressure 114/71, pulse 96, temperature 98.8 F (37.1 C), temperature source Oral, resp. rate 18, height  (1.6 m), weight 114 lb (51.71 kg), SpO2 98.00%. Physical Exam GENERAL: Well-developed, well-nourished female in no acute distress.  LUNGS: Clear to auscultation bilaterally.  HEART: Regular rate and rhythm. ABDOMEN: Soft, diffusely tender but greater in lower abdomen, non distended, no rebound, no guarding PELVIC: Normal external female genitalia. Uterus is normal in size. No adnexal mass, bilateral adnexal tenderness and suprapubic tenderness. EXTREMITIES: No cyanosis, clubbing, or edema, 2+ distal pulses.  Results for orders placed during the hospital encounter of 01/11/14 (from the past 24 hour(s))  URINALYSIS, ROUTINE W REFLEX MICROSCOPIC     Status: Abnormal   Collection Time    01/11/14 12:33 AM      Result Value Ref Range   Color, Urine YELLOW  YELLOW   APPearance CLOUDY (*) CLEAR   Specific Gravity, Urine 1.016  1.005 - 1.030   pH 5.5  5.0 -  8.0   Glucose, UA NEGATIVE  NEGATIVE mg/dL   Hgb urine dipstick LARGE (*) NEGATIVE   Bilirubin Urine NEGATIVE  NEGATIVE   Ketones, ur NEGATIVE  NEGATIVE mg/dL   Protein, ur NEGATIVE  NEGATIVE mg/dL   Urobilinogen, UA 0.2  0.0 - 1.0 mg/dL   Nitrite NEGATIVE  NEGATIVE   Leukocytes, UA LARGE (*) NEGATIVE  PREGNANCY, URINE     Status: None   Collection Time    01/11/14 12:33 AM      Result Value Ref Range   Preg Test, Ur NEGATIVE  NEGATIVE  URINE MICROSCOPIC-ADD ON     Status: Abnormal   Collection Time    01/11/14 12:33 AM      Result Value Ref Range   Squamous Epithelial / LPF  FEW (*) RARE   WBC, UA 11-20  <3 WBC/hpf   RBC / HPF 0-2  <3 RBC/hpf   Bacteria, UA FEW (*) RARE  CBC WITH DIFFERENTIAL     Status: Abnormal   Collection Time    01/11/14  1:15 AM      Result Value Ref Range   WBC 17.3 (*) 4.0 - 10.5 K/uL   RBC 4.71  3.87 - 5.11 MIL/uL   Hemoglobin 13.3  12.0 - 15.0 g/dL   HCT 81.1  91.4 - 78.2 %   MCV 79.8  78.0 - 100.0 fL   MCH 28.2  26.0 - 34.0 pg   MCHC 35.4  30.0 - 36.0 g/dL   RDW 95.6  21.3 - 08.6 %   Platelets 294  150 - 400 K/uL   Neutrophils Relative % 87 (*) 43 - 77 %   Neutro Abs 14.9 (*) 1.7 - 7.7 K/uL   Lymphocytes Relative 8 (*) 12 - 46 %   Lymphs Abs 1.4  0.7 - 4.0 K/uL   Monocytes Relative 4  3 - 12 %   Monocytes Absolute 0.7  0.1 - 1.0 K/uL   Eosinophils Relative 1  0 - 5 %   Eosinophils Absolute 0.2  0.0 - 0.7 K/uL   Basophils Relative 0  0 - 1 %   Basophils Absolute 0.0  0.0 - 0.1 K/uL  COMPREHENSIVE METABOLIC PANEL     Status: None   Collection Time    01/11/14  1:15 AM      Result Value Ref Range   Sodium 140  137 - 147 mEq/L   Potassium 3.8  3.7 - 5.3 mEq/L   Chloride 103  96 - 112 mEq/L   CO2 24  19 - 32 mEq/L   Glucose, Bld 95  70 - 99 mg/dL   BUN 13  6 - 23 mg/dL   Creatinine, Ser 5.78  0.50 - 1.10 mg/dL   Calcium 9.8  8.4 - 46.9 mg/dL   Total Protein 7.7  6.0 - 8.3 g/dL   Albumin 4.3  3.5 - 5.2 g/dL   AST 22  0 - 37 U/L   ALT 35  0 - 35 U/L   Alkaline Phosphatase 60  39 - 117 U/L   Total Bilirubin 0.3  0.3 - 1.2 mg/dL   GFR calc non Af Amer >90  >90 mL/min   GFR calc Af Amer >90  >90 mL/min   Anion gap 13  5 - 15  LIPASE, BLOOD     Status: None   Collection Time    01/11/14  1:15 AM      Result Value Ref Range   Lipase 32  11 -  59 U/L  WET PREP, GENITAL     Status: Abnormal   Collection Time    01/11/14  5:22 AM      Result Value Ref Range   Yeast Wet Prep HPF POC NONE SEEN  NONE SEEN   Trich, Wet Prep NONE SEEN  NONE SEEN   Clue Cells Wet Prep HPF POC TOO NUMEROUS TO COUNT (*) NONE SEEN   WBC, Wet  Prep HPF POC NONE SEEN  NONE SEEN    US Transvaginal Non-ob  01/11/2014   CLINICAL DATA:  Pelvic pain  EXAM: TRANSABDOMINAL AND TRANSVAGINAL ULTRASOUND OF PELVIS  TECHNIQUE: Both transabdominal and transvaginal ultrasound examinations of the pelvis were performed. Transabdominal technique was performed for global imaging of the pelvis including uterus, ovaries, adnexal regions, and pelvic cul-de-sac. It was necessary to proceed with endovaginal exam following the transabdominal exam to visualize the ovaries and endometrium.  COMPARISON:  None  FINDINGS: Uterus  Measurements: 8.2 x 4.1 x 4.4 cm. No fibroids or other mass visualized.  Endometrium  Thickness: 4.2 mm.  An IUD is noted in satisfactory position.  Right ovary  Measurements: 5.4 x 2.3 x 1.7 cm. A 2.0 cm complex cystic lesion is noted within the ovary. This likely represents a hemorrhagic cyst.  Left ovary  Measurements: 2.7 x 1.6 x 1.9 cm. Normal appearance/no adnexal mass.  Other findings  No free fluid.  IMPRESSION: Complex cystic lesion within the right ovary likely representing a hemorrhagic cyst. No definitive changes of tubo-ovarian abscess are seen.  IUD in place.   Electronically Signed   By: Alcide Clever M.D.   On: 01/11/2014 07:44   US Pelvis Complete  01/11/2014   CLINICAL DATA:  Pelvic pain  EXAM: TRANSABDOMINAL AND TRANSVAGINAL ULTRASOUND OF PELVIS  TECHNIQUE: Both transabdominal and transvaginal ultrasound examinations of the pelvis were performed. Transabdominal technique was performed for global imaging of the pelvis including uterus, ovaries, adnexal regions, and pelvic cul-de-sac. It was necessary to proceed with endovaginal exam following the transabdominal exam to visualize the ovaries and endometrium.  COMPARISON:  None  FINDINGS: Uterus  Measurements: 8.2 x 4.1 x 4.4 cm. No fibroids or other mass visualized.  Endometrium  Thickness: 4.2 mm.  An IUD is noted in satisfactory position.  Right ovary  Measurements: 5.4 x 2.3 x 1.7  cm. A 2.0 cm complex cystic lesion is noted within the ovary. This likely represents a hemorrhagic cyst.  Left ovary  Measurements: 2.7 x 1.6 x 1.9 cm. Normal appearance/no adnexal mass.  Other findings  No free fluid.  IMPRESSION: Complex cystic lesion within the right ovary likely representing a hemorrhagic cyst. No definitive changes of tubo-ovarian abscess are seen.  IUD in place.   Electronically Signed   By: Alcide Clever M.D.   On: 01/11/2014 07:44    Assessment/Plan: 23 yo admitted for treatment of PID - Will treat with IV antibiotics (mefoxitin and doxycycline) - Patient with positive BV- will add flagyl - pain management prn  Ramonte Mena 01/11/2014, 12:21 PM

## 2014-01-12 DIAGNOSIS — A499 Bacterial infection, unspecified: Secondary | ICD-10-CM

## 2014-01-12 DIAGNOSIS — N730 Acute parametritis and pelvic cellulitis: Secondary | ICD-10-CM

## 2014-01-12 DIAGNOSIS — N760 Acute vaginitis: Secondary | ICD-10-CM

## 2014-01-12 DIAGNOSIS — B9689 Other specified bacterial agents as the cause of diseases classified elsewhere: Secondary | ICD-10-CM

## 2014-01-12 LAB — CBC
HCT: 33.7 % — ABNORMAL LOW (ref 36.0–46.0)
Hemoglobin: 11.7 g/dL — ABNORMAL LOW (ref 12.0–15.0)
MCH: 28.1 pg (ref 26.0–34.0)
MCHC: 34.7 g/dL (ref 30.0–36.0)
MCV: 81 fL (ref 78.0–100.0)
Platelets: 222 10*3/uL (ref 150–400)
RBC: 4.16 MIL/uL (ref 3.87–5.11)
RDW: 14.2 % (ref 11.5–15.5)
WBC: 11 10*3/uL — AB (ref 4.0–10.5)

## 2014-01-12 LAB — GC/CHLAMYDIA PROBE AMP
CT PROBE, AMP APTIMA: NEGATIVE
GC Probe RNA: POSITIVE — AB

## 2014-01-12 MED ORDER — PROMETHAZINE HCL 25 MG/ML IJ SOLN
12.5000 mg | Freq: Four times a day (QID) | INTRAMUSCULAR | Status: DC | PRN
  Administered 2014-01-12: 12.5 mg via INTRAVENOUS
  Filled 2014-01-12: qty 1

## 2014-01-12 NOTE — Progress Notes (Signed)
Subjective: Patient reports feeling much better in comparison to admission. Her pain is still present but improved    Objective: I have reviewed patient's vital signs and labs.  General: alert, cooperative and no distress GI: soft, tender in lower abdomen, no rebound, no guarding Extremities: extremities normal, atraumatic, no cyanosis or edema and Homans sign is negative, no sign of DVT   Assessment/Plan: 23 yo admitted with PID - Tmax was at 8 am 9/18 100.4 - WBC normalizing - Continue IV antibiotics - Discharge planning tomorrow morning if remain afebrile - pain management prn    LOS: 1 day    Bela Nyborg 01/12/2014, 6:35 AM

## 2014-01-13 DIAGNOSIS — N739 Female pelvic inflammatory disease, unspecified: Principal | ICD-10-CM

## 2014-01-13 DIAGNOSIS — A5400 Gonococcal infection of lower genitourinary tract, unspecified: Secondary | ICD-10-CM

## 2014-01-13 DIAGNOSIS — Z30432 Encounter for removal of intrauterine contraceptive device: Secondary | ICD-10-CM

## 2014-01-13 DIAGNOSIS — Z30431 Encounter for routine checking of intrauterine contraceptive device: Secondary | ICD-10-CM

## 2014-01-13 MED ORDER — IBUPROFEN 600 MG PO TABS: 600.0000 mg | ORAL_TABLET | Freq: Four times a day (QID) | ORAL | Status: DC | PRN

## 2014-01-13 MED ORDER — METRONIDAZOLE 500 MG PO TABS: 500.0000 mg | ORAL_TABLET | Freq: Two times a day (BID) | ORAL | Status: DC

## 2014-01-13 MED ORDER — DOXYCYCLINE HYCLATE 100 MG PO TABS: 100.0000 mg | ORAL_TABLET | Freq: Two times a day (BID) | ORAL | Status: DC

## 2014-01-13 NOTE — Discharge Summary (Signed)
Physician Discharge Summary  Patient ID: [name] MRN: [id number] DOB/AGE: [birth year] 22 y.o.  Admit date: 01/11/2014 Discharge date: 01/13/2014  Admission Diagnoses:PID  Discharge Diagnoses: PID, gonorrhea Active Problems:   PID (acute pelvic inflammatory disease)   Discharged Condition: good  Hospital Course: 23 y.o. G45P1011 Mirena in place for 2 years, developed low abdominal pain and presented to [facility]. Elevated WBC and pelvic tenderness noted, diagnosed with PID. Positive gonorrhea. IUD was removed 9/20.  Consults: None  Significant Diagnostic Studies: labs:  Recent Results (from the past 240 hour(s))  GC/CHLAMYDIA PROBE AMP     Status: Abnormal   Collection Time    01/11/14  5:22 AM      Result Value Ref Range Status   CT Probe RNA NEGATIVE  NEGATIVE Final   GC Probe RNA POSITIVE (*) NEGATIVE Final   Comment: (NOTE)     A Positive CT or NG Nucleic Acid Amplification Test (NAAT) result     should be considered presumptive evidence of infection.  The result     should be evaluated along with physical examination and other     diagnostic findings.                                                                                               **Normal Reference Range: Negative**          Assay performed using the Gen-Probe APTIMA COMBO2 (R) Assay.     Acceptable specimen types for this assay include APTIMA Swabs (Unisex,     endocervical, urethral, or vaginal), first void urine, and ThinPrep     liquid based cytology samples.     Performed at [facility]  WET PREP, GENITAL     Status: Abnormal   Collection Time    01/11/14  5:22 AM      Result Value Ref Range Status   Yeast Wet Prep HPF POC NONE SEEN  NONE SEEN Final   Trich, Wet Prep NONE SEEN  NONE SEEN Final   Clue Cells Wet Prep HPF POC TOO NUMEROUS TO COUNT (*) NONE SEEN Final   WBC, Wet Prep HPF POC NONE SEEN  NONE SEEN Final   2 and radiology: Ultrasound: pelvic  Treatments: antibiotics:  metronidazole and doxycycline and cfoxitin  Discharge Exam: Blood pressure 105/61, pulse 58, temperature 97.9 F (36.6 C), temperature source Oral, resp. rate 15, height  (1.6 m), weight 114 lb (51.71 kg), SpO2 100.00%. General appearance: alert, cooperative and no distress GI: soft, non-tender; bowel sounds normal; no masses,  no organomegaly IUD removed intact with ring forceps tolerated well  Disposition: 01-Home or Self Care     Medication List    STOP taking these medications       levonorgestrel 20 MCG/24HR IUD  Commonly known as:  MIRENA      TAKE these medications       albuterol 108 (90 BASE) MCG/ACT inhaler  Commonly known as:  PROVENTIL HFA;VENTOLIN HFA  Inhale 1-2 puffs into the lungs every 6 (six) hours as needed for wheezing or shortness of breath.     doxycycline  100 MG tablet  Commonly known as:  VIBRA-TABS  Take 1 tablet (100 mg total) by mouth every 12 (twelve) hours.     ibuprofen 600 MG tablet  Commonly known as:  ADVIL,MOTRIN  Take 1 tablet (600 mg total) by mouth every 6 (six) hours as needed (mild pain).     metroNIDAZOLE 500 MG tablet  Commonly known as:  FLAGYL  Take 1 tablet (500 mg total) by mouth 2 (two) times daily.     naproxen sodium 220 MG tablet  Commonly known as:  ANAPROX  Take 220 mg by mouth daily as needed (pain).           Follow-up Information   Follow up with [facility] In 3 weeks.   Contact information:   [address] [place] [address] [phone]       Signed: Scheryl Darter 01/13/2014, 10:25 AM

## 2014-01-13 NOTE — Progress Notes (Signed)
Assisted and observed removal of Mirena IUD with Dr. Debroah Loop.  Patient tolerated well.  Will continue to monitor.  Osvaldo Angst, RN--------

## 2014-01-13 NOTE — Progress Notes (Signed)
Discharge instructions provided to patient at bedside.  Activities, medications, follow up appointments, when to call the doctor and community resources discussed.  No questions at this time.   Patient left unit in stable condition with all personal belongings accompanied by Clinical research associate.  Osvaldo Angst, RN----------------------------

## 2014-01-13 NOTE — Discharge Instructions (Signed)
Pelvic Inflammatory Disease °Pelvic inflammatory disease (PID) refers to an infection in some or all of the female organs. The infection can be in the uterus, ovaries, fallopian tubes, or the surrounding tissues in the pelvis. PID can cause abdominal or pelvic pain that comes on suddenly (acute pelvic pain). PID is a serious infection because it can lead to lasting (chronic) pelvic pain or the inability to have children (infertile).  °CAUSES  °The infection is often caused by the normal bacteria found in the vaginal tissues. PID may also be caused by an infection that is spread during sexual contact. PID can also occur following:  °· The birth of a baby.   °· A miscarriage.   °· An abortion.   °· Major pelvic surgery.   °· The use of an intrauterine device (IUD).   °· A sexual assault.   °RISK FACTORS °Certain factors can put a person at higher risk for PID, such as: °· Being younger than 25 years. °· Being sexually active at a young age. °· Using nonbarrier contraception. °· Having multiple sexual partners. °· Having sex with someone who has symptoms of a genital infection. °· Using oral contraception. °Other times, certain behaviors can increase the possibility of getting PID, such as: °· Having sex during your period. °· Using a vaginal douche. °· Having an intrauterine device (IUD) in place. °SYMPTOMS  °· Abdominal or pelvic pain.   °· Fever.   °· Chills.   °· Abnormal vaginal discharge. °· Abnormal uterine bleeding.   °· Unusual pain shortly after finishing your period. °DIAGNOSIS  °Your caregiver will choose some of the following methods to make a diagnosis, such as:  °· Performing a physical exam and history. A pelvic exam typically reveals a very tender uterus and surrounding pelvis.   °· Ordering laboratory tests including a pregnancy test, blood tests, and urine test.  °· Ordering cultures of the vagina and cervix to check for a sexually transmitted infection (STI). °· Performing an ultrasound.    °· Performing a laparoscopic procedure to look inside the pelvis.   °TREATMENT  °· Antibiotic medicines may be prescribed and taken by mouth.   °· Sexual partners may be treated when the infection is caused by a sexually transmitted disease (STD).   °· Hospitalization may be needed to give antibiotics intravenously. °· Surgery may be needed, but this is rare. °It may take weeks until you are completely well. If you are diagnosed with PID, you should also be checked for human immunodeficiency virus (HIV).   °HOME CARE INSTRUCTIONS  °· If given, take your antibiotics as directed. Finish the medicine even if you start to feel better.   °· Only take over-the-counter or prescription medicines for pain, discomfort, or fever as directed by your caregiver.   °· Do not have sexual intercourse until treatment is completed or as directed by your caregiver. If PID is confirmed, your recent sexual partner(s) will need treatment.   °· Keep your follow-up appointments. °SEEK MEDICAL CARE IF:  °· You have increased or abnormal vaginal discharge.   °· You need prescription medicine for your pain.   °· You vomit.   °· You cannot take your medicines.   °· Your partner has an STD.   °SEEK IMMEDIATE MEDICAL CARE IF:  °· You have a fever.   °· You have increased abdominal or pelvic pain.   °· You have chills.   °· You have pain when you urinate.   °· You are not better after 72 hours following treatment.   °MAKE SURE YOU:  °· Understand these instructions. °· Will watch your condition. °· Will get help right away if you are not doing well or get worse. °  Document Released: 04/12/2005 Document Revised: 08/07/2012 Document Reviewed: 04/08/2011 °[employer]® Patient Information ©2015 [employer]. This information is not intended to replace advice given to you by your health care provider. Make sure you discuss any questions you have with your health care provider. ° °

## 2014-01-13 NOTE — Plan of Care (Signed)
Problem: Phase III Progression Outcomes Goal: Pain controlled on oral analgesia Outcome: Completed/Met Date Met:  01/13/14 Good pain control on po Percocet.

## 2014-01-14 NOTE — Progress Notes (Signed)
Post dc ur review completed. 

## 2014-02-11 ENCOUNTER — Encounter (HOSPITAL_COMMUNITY): Payer: Self-pay

## 2014-02-11 ENCOUNTER — Inpatient Hospital Stay (HOSPITAL_COMMUNITY)
Admission: AD | Admit: 2014-02-11 | Discharge: 2014-02-12 | Disposition: A | Discharge status: Home / Self Care | Payer: Medicaid Other | Source: Ambulatory Visit | Attending: Family Medicine | Admitting: Family Medicine

## 2014-02-11 DIAGNOSIS — F17210 Nicotine dependence, cigarettes, uncomplicated: Secondary | ICD-10-CM | POA: Diagnosis not present

## 2014-02-11 DIAGNOSIS — N739 Female pelvic inflammatory disease, unspecified: Secondary | ICD-10-CM | POA: Diagnosis not present

## 2014-02-11 DIAGNOSIS — R109 Unspecified abdominal pain: Secondary | ICD-10-CM | POA: Diagnosis present

## 2014-02-11 DIAGNOSIS — N730 Acute parametritis and pelvic cellulitis: Secondary | ICD-10-CM

## 2014-02-11 LAB — CBC WITH DIFFERENTIAL/PLATELET
BASOS ABS: 0 10*3/uL (ref 0.0–0.1)
BASOS PCT: 0 % (ref 0–1)
Eosinophils Absolute: 0 10*3/uL (ref 0.0–0.7)
Eosinophils Relative: 0 % (ref 0–5)
HEMATOCRIT: 38.2 % (ref 36.0–46.0)
HEMOGLOBIN: 13.4 g/dL (ref 12.0–15.0)
Lymphocytes Relative: 7 % — ABNORMAL LOW (ref 12–46)
Lymphs Abs: 1.4 10*3/uL (ref 0.7–4.0)
MCH: 28.3 pg (ref 26.0–34.0)
MCHC: 35.1 g/dL (ref 30.0–36.0)
MCV: 80.8 fL (ref 78.0–100.0)
MONOS PCT: 3 % (ref 3–12)
Monocytes Absolute: 0.6 10*3/uL (ref 0.1–1.0)
NEUTROS ABS: 18.1 10*3/uL — AB (ref 1.7–7.7)
NEUTROS PCT: 90 % — AB (ref 43–77)
Platelets: 277 10*3/uL (ref 150–400)
RBC: 4.73 MIL/uL (ref 3.87–5.11)
RDW: 14.2 % (ref 11.5–15.5)
WBC: 20 10*3/uL — ABNORMAL HIGH (ref 4.0–10.5)

## 2014-02-11 LAB — URINALYSIS, ROUTINE W REFLEX MICROSCOPIC
Bilirubin Urine: NEGATIVE
Glucose, UA: NEGATIVE mg/dL
Hgb urine dipstick: NEGATIVE
Ketones, ur: 15 mg/dL — AB
Nitrite: NEGATIVE
PH: 6 (ref 5.0–8.0)
Protein, ur: NEGATIVE mg/dL
SPECIFIC GRAVITY, URINE: 1.02 (ref 1.005–1.030)
Urobilinogen, UA: 0.2 mg/dL (ref 0.0–1.0)

## 2014-02-11 LAB — POCT PREGNANCY, URINE: Preg Test, Ur: NEGATIVE

## 2014-02-11 LAB — WET PREP, GENITAL
Trich, Wet Prep: NONE SEEN
Yeast Wet Prep HPF POC: NONE SEEN

## 2014-02-11 LAB — URINE MICROSCOPIC-ADD ON

## 2014-02-11 MED ORDER — KETOROLAC TROMETHAMINE 60 MG/2ML IM SOLN
60.0000 mg | Freq: Once | INTRAMUSCULAR | Status: AC
  Administered 2014-02-11: 60 mg via INTRAMUSCULAR
  Filled 2014-02-11: qty 2

## 2014-02-11 NOTE — MAU Provider Note (Signed)
History     CSN: [id number]  Arrival date and time: 02/11/14 1946   First Provider Initiated Contact with Patient 02/11/14 2159      Chief Complaint  Patient presents with  . Pelvic Pain  . Abdominal Pain   HPI Kristin Kemp 22 y.o. [id number] presents to MAU complaining of abdominal pain that started 3 days ago that is getting worse.  She reports the pain it is 8-9/10 and is right in the middle of her stomach.  Eating does not change the pain.  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  She has nausea and believes her taste buds are different.  She usually has multiple bowel movements per day but today only one.  That BM was liquidy and painful.   She denies vomiting, fever, weakness, chills, sweats, vaginal discharge or bleeding.   Her LMP was 10/14 and was normal and over by 10/16.  This pain started immediately after.   Her Mirena was taken out last month and this was the first period after having Mirena removed.    OB History   Grav Para Term Preterm Abortions TAB SAB Ect Mult Living   2 1 1  1  1   0 1      Past Medical History  Diagnosis Date  . Urinary tract infection   . Ovarian cyst   . Abnormal Pap smear   . Herpes   . Late prenatal care     Past Surgical History  Procedure Laterality Date  . Dilation and curettage of uterus  2009    Family History  Problem Relation Age of Onset  . Hypertension Maternal Grandmother   . Hyperlipidemia Maternal Grandmother   . Heart disease Maternal Grandmother     enlarged heart  . Anesthesia problems Neg Hx   . Diabetes Maternal Uncle   . Hypertension Maternal Uncle   . Kidney disease Maternal Uncle   . Cancer Paternal Aunt     lung  . Cancer Paternal Uncle     lung  . Diabetes Maternal Grandfather     History  Substance Use Topics  . Smoking status: Current Every Day Smoker -- 0.25 packs/day    Types: Cigarettes    Last Attempt to Quit: 10/21/2010  . Smokeless  tobacco: Never Used  . Alcohol Use: Yes     Comment: occasionally    Allergies:  Allergies  Allergen Reactions  . Chocolate Hives and Itching  . Cinnamon Other (See Comments)    Turns tongue raw.  . Latex Other (See Comments)    Reports vaginal sensitivity after latex condom use.    Prescriptions prior to admission  Medication Sig Dispense Refill  . ibuprofen (ADVIL,MOTRIN) 200 MG tablet Take 200 mg by mouth every 6 (six) hours as needed.      Marland Kitchen albuterol (PROVENTIL HFA;VENTOLIN HFA) 108 (90 BASE) MCG/ACT inhaler Inhale 1-2 puffs into the lungs every 6 (six) hours as needed for wheezing or shortness of breath.  1 Inhaler  0  . doxycycline (VIBRA-TABS) 100 MG tablet Take 1 tablet (100 mg total) by mouth every 12 (twelve) hours.  18 tablet  0  . ibuprofen (ADVIL,MOTRIN) 600 MG tablet Take 1 tablet (600 mg total) by mouth every 6 (six) hours as needed (mild pain).  30 tablet  0  . metroNIDAZOLE (FLAGYL) 500 MG tablet Take 1 tablet (500 mg total) by mouth 2 (two) times daily.  18 tablet  0  . naproxen sodium (ANAPROX) 220 MG tablet Take 220 mg by mouth daily as needed (pain).        Review of Systems  Constitutional: Negative for fever, chills and diaphoresis.  HENT: Negative for congestion and sore throat.   Respiratory: Negative for cough, shortness of breath and wheezing.   Cardiovascular: Negative for chest pain and palpitations.  Gastrointestinal: Positive for nausea, abdominal pain and diarrhea. Negative for heartburn, vomiting, constipation and blood in stool.  Genitourinary: Positive for frequency. Negative for dysuria, hematuria and flank pain.  Musculoskeletal: Negative for back pain and neck pain.  Neurological: Positive for dizziness. Negative for tingling, weakness and headaches.  Psychiatric/Behavioral: Negative for depression, suicidal ideas and substance abuse. The patient is not nervous/anxious.    Physical Exam   Blood pressure 112/74, pulse 94, temperature 98.1 F  (36.7 C), temperature source Oral, resp. rate 18, height 5\' 3"  (1.6 m), weight 111 lb (50.349 kg), last menstrual period 02/06/2014, SpO2 100.00%.  Physical Exam  Constitutional: She is oriented to person, place, and time. She appears well-developed and well-nourished. No distress.  HENT:  Head: Normocephalic and atraumatic.  Eyes: EOM are normal.  Neck: Normal range of motion.  Cardiovascular: Normal rate, regular rhythm and normal heart sounds.   Respiratory: Effort normal and breath sounds normal. No respiratory distress.  GI: Soft. Bowel sounds are normal. She exhibits no distension. There is tenderness.  Tenderness in RUQ and LLQ  Genitourinary:  Vagina with large amt of greenish, opaque discharge.  No mucus seen in cervical os.   Diffusely tender across adnexa and with cervical motion.  Musculoskeletal: Normal range of motion.  Neurological: She is alert and oriented to person, place, and time.  Skin: Skin is warm and dry.  Psychiatric: She has a normal mood and affect.   Results for orders placed during the hospital encounter of 02/11/14 (from the past 24 hour(s))  URINALYSIS, ROUTINE W REFLEX MICROSCOPIC     Status: Abnormal   Collection Time    02/11/14  8:06 PM      Result Value Ref Range   Color, Urine YELLOW  YELLOW   APPearance CLEAR  CLEAR   Specific Gravity, Urine 1.020  1.005 - 1.030   pH 6.0  5.0 - 8.0   Glucose, UA NEGATIVE  NEGATIVE mg/dL   Hgb urine dipstick NEGATIVE  NEGATIVE   Bilirubin Urine NEGATIVE  NEGATIVE   Ketones, ur 15 (*) NEGATIVE mg/dL   Protein, ur NEGATIVE  NEGATIVE mg/dL   Urobilinogen, UA 0.2  0.0 - 1.0 mg/dL   Nitrite NEGATIVE  NEGATIVE   Leukocytes, UA TRACE (*) NEGATIVE  URINE MICROSCOPIC-ADD ON     Status: Abnormal   Collection Time    02/11/14  8:06 PM      Result Value Ref Range   Squamous Epithelial / LPF FEW (*) RARE   WBC, UA 3-6  <3 WBC/hpf   Bacteria, UA FEW (*) RARE  POCT PREGNANCY, URINE     Status: None   Collection Time     02/11/14  9:04 PM      Result Value Ref Range   Preg Test, Ur NEGATIVE  NEGATIVE  CBC WITH DIFFERENTIAL     Status: Abnormal   Collection Time    02/11/14 10:45 PM      Result Value Ref Range   WBC 20.0 (*) 4.0 - 10.5 K/uL   RBC 4.73  3.87 - 5.11 MIL/uL   Hemoglobin 13.4  12.0 - 15.0 g/dL   HCT 16.138.2  09.636.0 - 04.546.0 %   MCV 80.8  78.0 - 100.0 fL   MCH 28.3  26.0 - 34.0 pg   MCHC 35.1  30.0 - 36.0 g/dL   RDW 40.914.2  81.111.5 - 91.415.5 %   Platelets 277  150 - 400 K/uL   Neutrophils Relative % 90 (*) 43 - 77 %   Neutro Abs 18.1 (*) 1.7 - 7.7 K/uL   Lymphocytes Relative 7 (*) 12 - 46 %   Lymphs Abs 1.4  0.7 - 4.0 K/uL   Monocytes Relative 3  3 - 12 %   Monocytes Absolute 0.6  0.1 - 1.0 K/uL   Eosinophils Relative 0  0 - 5 %   Eosinophils Absolute 0.0  0.0 - 0.7 K/uL   Basophils Relative 0  0 - 1 %   Basophils Absolute 0.0  0.0 - 0.1 K/uL  WET PREP, GENITAL     Status: Abnormal   Collection Time    02/11/14 11:22 PM      Result Value Ref Range   Yeast Wet Prep HPF POC NONE SEEN  NONE SEEN   Trich, Wet Prep NONE SEEN  NONE SEEN   Clue Cells Wet Prep HPF POC FEW (*) NONE SEEN   WBC, Wet Prep HPF POC MANY (*) NONE SEEN    MAU Course  Procedures  MDM Significant adnexal/abdominal tenderness, elevated WBC count of 20, wet prep with many wbc all suggestive of PID.  Pt has h/o PID and reports this is what it felt like.  Will give Rocephin in house and doxy outpatient.  Assessment and Plan  A: PID  P: Discharge to home. Doxycycline 100mg  po bid x 10 days Push fluids OTC Tylenol/ibuprofen for pain/discomfort Return to GYN clinic on 11/4  Return to MAU if symptoms worsen    Bertram Denvereague Clark, Karen E 02/11/2014, 10:01 PM

## 2014-02-11 NOTE — MAU Note (Signed)
Pt reports pelvic pain and abd pain x 2 days, worsening today. LMP 10/14, pt states she thought it may be due to having her IUD removed in Sept but it worsened today. Pain with urination,.

## 2014-02-12 DIAGNOSIS — N730 Acute parametritis and pelvic cellulitis: Secondary | ICD-10-CM

## 2014-02-12 LAB — HIV ANTIBODY (ROUTINE TESTING W REFLEX): HIV 1&2 Ab, 4th Generation: NONREACTIVE

## 2014-02-12 MED ORDER — DOXYCYCLINE MONOHYDRATE 100 MG PO CAPS: 100.0000 mg | ORAL_CAPSULE | Freq: Two times a day (BID) | ORAL | Status: DC

## 2014-02-12 MED ORDER — CEFTRIAXONE SODIUM 250 MG IJ SOLR
250.0000 mg | INTRAMUSCULAR | Status: DC
  Administered 2014-02-12: 250 mg via INTRAMUSCULAR
  Filled 2014-02-12: qty 250

## 2014-02-12 NOTE — Discharge Instructions (Signed)
Pelvic Inflammatory Disease °Pelvic inflammatory disease (PID) refers to an infection in some or all of the female organs. The infection can be in the uterus, ovaries, fallopian tubes, or the surrounding tissues in the pelvis. PID can cause abdominal or pelvic pain that comes on suddenly (acute pelvic pain). PID is a serious infection because it can lead to lasting (chronic) pelvic pain or the inability to have children (infertile).  °CAUSES  °The infection is often caused by the normal bacteria found in the vaginal tissues. PID may also be caused by an infection that is spread during sexual contact. PID can also occur following:  °· The birth of a baby.   °· A miscarriage.   °· An abortion.   °· Major pelvic surgery.   °· The use of an intrauterine device (IUD).   °· A sexual assault.   °RISK FACTORS °Certain factors can put a person at higher risk for PID, such as: °· Being younger than 25 years. °· Being sexually active at a young age. °· Using nonbarrier contraception. °· Having multiple sexual partners. °· Having sex with someone who has symptoms of a genital infection. °· Using oral contraception. °Other times, certain behaviors can increase the possibility of getting PID, such as: °· Having sex during your period. °· Using a vaginal douche. °· Having an intrauterine device (IUD) in place. °SYMPTOMS  °· Abdominal or pelvic pain.   °· Fever.   °· Chills.   °· Abnormal vaginal discharge. °· Abnormal uterine bleeding.   °· Unusual pain shortly after finishing your period. °DIAGNOSIS  °Your caregiver will choose some of the following methods to make a diagnosis, such as:  °· Performing a physical exam and history. A pelvic exam typically reveals a very tender uterus and surrounding pelvis.   °· Ordering laboratory tests including a pregnancy test, blood tests, and urine test.  °· Ordering cultures of the vagina and cervix to check for a sexually transmitted infection (STI). °· Performing an ultrasound.    °· Performing a laparoscopic procedure to look inside the pelvis.   °TREATMENT  °· Antibiotic medicines may be prescribed and taken by mouth.   °· Sexual partners may be treated when the infection is caused by a sexually transmitted disease (STD).   °· Hospitalization may be needed to give antibiotics intravenously. °· Surgery may be needed, but this is rare. °It may take weeks until you are completely well. If you are diagnosed with PID, you should also be checked for human immunodeficiency virus (HIV).   °HOME CARE INSTRUCTIONS  °· If given, take your antibiotics as directed. Finish the medicine even if you start to feel better.   °· Only take over-the-counter or prescription medicines for pain, discomfort, or fever as directed by your caregiver.   °· Do not have sexual intercourse until treatment is completed or as directed by your caregiver. If PID is confirmed, your recent sexual partner(s) will need treatment.   °· Keep your follow-up appointments. °SEEK MEDICAL CARE IF:  °· You have increased or abnormal vaginal discharge.   °· You need prescription medicine for your pain.   °· You vomit.   °· You cannot take your medicines.   °· Your partner has an STD.   °SEEK IMMEDIATE MEDICAL CARE IF:  °· You have a fever.   °· You have increased abdominal or pelvic pain.   °· You have chills.   °· You have pain when you urinate.   °· You are not better after 72 hours following treatment.   °MAKE SURE YOU:  °· Understand these instructions. °· Will watch your condition. °· Will get help right away if you are not doing well or get worse. °  Document Released: 04/12/2005 Document Revised: 08/07/2012 Document Reviewed: 04/08/2011 °[employer]® Patient Information ©2015 [employer]. This information is not intended to replace advice given to you by your health care provider. Make sure you discuss any questions you have with your health care provider. ° °

## 2014-02-13 NOTE — MAU Provider Note (Signed)
Attestation of Attending Supervision of Advanced Practitioner (PA/CNM/NP): Evaluation and management procedures were performed by the Advanced Practitioner under my supervision and collaboration.  I have reviewed the Advanced Practitioner's note and chart, and I agree with the management and plan.  Jacob Stinson, DO Attending Physician Faculty Practice, [facility]  

## 2014-02-14 LAB — GC/CHLAMYDIA PROBE AMP
CT Probe RNA: NEGATIVE
GC PROBE AMP APTIMA: POSITIVE — AB

## 2014-02-16 ENCOUNTER — Telehealth: Payer: Self-pay | Admitting: Advanced Practice Midwife

## 2014-02-16 NOTE — Telephone Encounter (Signed)
LM for pt to return call about lab results.   Pt gonorrhea positive on 10/19, was treated in MAU for PID, including ceftriaxone injection.  Need to confirm if taking Doxycyclene PO as prescribed.  Pt returned call, already notified and has scheduled retest.  She reports she is taking Doxy as prescribed.

## 2014-02-25 ENCOUNTER — Encounter (HOSPITAL_COMMUNITY): Payer: Self-pay

## 2014-02-27 ENCOUNTER — Telehealth: Payer: Self-pay

## 2014-02-27 ENCOUNTER — Ambulatory Visit: Payer: Medicaid Other | Admitting: Family Medicine

## 2014-02-27 NOTE — Telephone Encounter (Signed)
MAU f/u appointment rescheduled for this Friday 03/01/14. Attempted to notify patient. Mailbox full. Unable to leave message.

## 2014-02-27 NOTE — Telephone Encounter (Signed)
Patient missed MAU f/u appointment today. Called patient and she states she got called into work-- would like to reschedule. Informed patient she will be called with an appointment from our front office staff. Advised that she call clinic if she does not hear from us by Friday 03/01/14. Pt. Verbalized understanding and gratitude. No questions or concerns.

## 2014-02-27 NOTE — Telephone Encounter (Signed)
-----   Message from Vivien Rotaheryl A Clinton sent at 02/27/2014  2:22 PM EST ----- Appointment made, but her mailbox was full.   ----- Message -----    From: Louanna Rawaylor M Dian Minahan, RN    Sent: 02/27/2014   2:07 PM      To: [facility] Pool  Please reschedule patient's MAU f/u appointment for as soon as possible and call patient with appointment. Thank you!

## 2014-02-28 ENCOUNTER — Telehealth: Payer: Self-pay

## 2014-02-28 NOTE — Telephone Encounter (Signed)
Called patient. No answer. Was able to leave message informing her of rescheduled appointment date and time and asked that she call if she needs to reschedule.

## 2014-02-28 NOTE — Telephone Encounter (Signed)
Patient called stating she was called about an appointment but have a question. Called patient who states she cannot make appointment tomorrow and next available is December-- wants to know if she even needs to f/u. Advised patient that we would like her to follow up but stated if she cannot make her appointment she should ensure she takes ABX in its entirety and to come to MAU should symptoms get worse or return. Patient verbalized understanding and gratitude. No further questions or concerns.

## 2014-03-01 ENCOUNTER — Ambulatory Visit: Payer: Medicaid Other | Admitting: Obstetrics & Gynecology

## 2014-03-29 ENCOUNTER — Encounter (HOSPITAL_COMMUNITY): Payer: Self-pay | Admitting: Emergency Medicine

## 2014-03-29 ENCOUNTER — Emergency Department (HOSPITAL_COMMUNITY)
Admission: EM | Admit: 2014-03-29 | Discharge: 2014-03-29 | Disposition: A | Discharge status: Home / Self Care | Payer: Medicaid Other | Attending: Emergency Medicine | Admitting: Emergency Medicine

## 2014-03-29 DIAGNOSIS — Z720 Tobacco use: Secondary | ICD-10-CM | POA: Insufficient documentation

## 2014-03-29 DIAGNOSIS — Z8619 Personal history of other infectious and parasitic diseases: Secondary | ICD-10-CM | POA: Insufficient documentation

## 2014-03-29 DIAGNOSIS — H109 Unspecified conjunctivitis: Secondary | ICD-10-CM | POA: Insufficient documentation

## 2014-03-29 DIAGNOSIS — Z8742 Personal history of other diseases of the female genital tract: Secondary | ICD-10-CM | POA: Diagnosis not present

## 2014-03-29 DIAGNOSIS — Z91040 Latex allergy status: Secondary | ICD-10-CM | POA: Insufficient documentation

## 2014-03-29 DIAGNOSIS — Z792 Long term (current) use of antibiotics: Secondary | ICD-10-CM | POA: Diagnosis not present

## 2014-03-29 DIAGNOSIS — Z87440 Personal history of urinary (tract) infections: Secondary | ICD-10-CM | POA: Insufficient documentation

## 2014-03-29 DIAGNOSIS — H5711 Ocular pain, right eye: Secondary | ICD-10-CM | POA: Diagnosis present

## 2014-03-29 MED ORDER — ERYTHROMYCIN 5 MG/GM OP OINT: TOPICAL_OINTMENT | OPHTHALMIC | Status: DC

## 2014-03-29 MED ORDER — FLUORESCEIN SODIUM 1 MG OP STRP: 1.0000 | ORAL_STRIP | Freq: Once | OPHTHALMIC | Status: DC

## 2014-03-29 MED ORDER — PROPARACAINE HCL 0.5 % OP SOLN
1.0000 [drp] | Freq: Once | OPHTHALMIC | Status: AC
  Administered 2014-03-29: 1 [drp] via OPHTHALMIC

## 2014-03-29 NOTE — ED Notes (Signed)
Right eye 20/70. Left eye 20/30. Both 20/30.

## 2014-03-29 NOTE — Discharge Instructions (Signed)

## 2014-03-29 NOTE — ED Notes (Signed)
Pt thinks she has pink eye. Dressing in place.

## 2014-03-29 NOTE — ED Provider Notes (Signed)
CSN: [id number]     Arrival date & time 03/29/14  1242 History  This chart was scribed for Arthor CaptainAbigail Ulah Olmo, PA-C, working with Doug SouSam Jacubowitz, MD by Chestine SporeSoijett Blue, ED Scribe. The patient was seen in room TR04C/TR04C at 1:16 PM.    Chief Complaint  Patient presents with  . Eye Pain    The history is provided by the patient. No language interpreter was used.   HPI Comments: Kristin Kemp is a 23 y.o. female who presents to the Emergency Department complaining of eye pain onset this monnrninr. She thinks that she has pink eye. This morning she was not able to open her eye. She wears contacts. There is pain with opening her eye. Pt slept with her contacts in last night because she was tired from work. Pt has a contact in the left eye and she is unable to drive without it. Denies hx of glaucoma. Pt eyes were crusted when she woke up this morning. She states that she is having associated symptoms of photophobia, eyes watering.  She denies any other symptoms.  Past Medical History  Diagnosis Date  . Urinary tract infection   . Ovarian cyst   . Abnormal Pap smear   . Herpes   . Late prenatal care    Past Surgical History  Procedure Laterality Date  . Dilation and curettage of uterus  2009   Family History  Problem Relation Age of Onset  . Hypertension Maternal Grandmother   . Hyperlipidemia Maternal Grandmother   . Heart disease Maternal Grandmother     enlarged heart  . Anesthesia problems Neg Hx   . Diabetes Maternal Uncle   . Hypertension Maternal Uncle   . Kidney disease Maternal Uncle   . Cancer Paternal Aunt     lung  . Cancer Paternal Uncle     lung  . Diabetes Maternal Grandfather    History  Substance Use Topics  . Smoking status: Current Every Day Smoker -- 0.25 packs/day    Types: Cigarettes    Last Attempt to Quit: 10/21/2010  . Smokeless tobacco: Never Used  . Alcohol Use: Yes     Comment: occasionally   OB History    Gravida Para Term Preterm AB TAB SAB  Ectopic Multiple Living   2 1 1  1  1   0 1     Review of Systems  Eyes: Positive for photophobia and pain.    Allergies  Chocolate; Cinnamon; and Latex  Home Medications   Prior to Admission medications   Medication Sig Start Date End Date Taking? Authorizing Provider  albuterol (PROVENTIL HFA;VENTOLIN HFA) 108 (90 BASE) MCG/ACT inhaler Inhale 1-2 puffs into the lungs every 6 (six) hours as needed for wheezing or shortness of breath. 07/08/13   Olivia Mackielga M Otter, MD  doxycycline (MONODOX) 100 MG capsule Take 1 capsule (100 mg total) by mouth 2 (two) times daily. 02/12/14   Bertram DenverKaren E Teague Clark, PA-C  ibuprofen (ADVIL,MOTRIN) 200 MG tablet Take 200 mg by mouth every 6 (six) hours as needed.    Historical Provider, MD   BP 110/68 mmHg  Temp(Src) 98 F (36.7 C) (Oral)  Resp 18  SpO2 100%   Physical Exam  Constitutional: She is oriented to person, place, and time. She appears well-developed and well-nourished. No distress.  HENT:  Head: Normocephalic and atraumatic.  Eyes: EOM are normal. Lids are everted and swept, no foreign bodies found.  Slit lamp exam:      The right eye  shows no fluorescein uptake.  Swollen conjunctiva of the right eye. No fluorescence uptake of the right eye. Tonopen exam was deferred.   Neck: Neck supple. No tracheal deviation present.  Cardiovascular: Normal rate.   Pulmonary/Chest: Effort normal. No respiratory distress.  Musculoskeletal: Normal range of motion.  Neurological: She is alert and oriented to person, place, and time.  Skin: Skin is warm and dry.  Psychiatric: She has a normal mood and affect. Her behavior is normal.  Nursing note and vitals reviewed.   ED Course  Procedures (including critical care time) DIAGNOSTIC STUDIES: Oxygen Saturation is 100% on room air, normal by my interpretation.    COORDINATION OF CARE: 1:32 PM-Discussed treatment plan which includes erythromycin ointment with pt at bedside and pt agreed to plan.   Labs  Review Labs Reviewed - No data to display  Imaging Review No results found.   EKG Interpretation None      MDM   Final diagnoses:  Conjunctivitis of right eye    Vision with apparent conjunctivitis of the right eye. I have encouraged her to discontinue using the contact lenses until her wound is healed. Unable to perform. Tono-Pen examination at this time. Patient given erythromycin ointment. She is asked to follow up closely with her ophthalmologist or the ophthalmologist on-call.  Right eye 20/70 (not wearing contact lens). Left eye 20/30. Both 20/30. I personally performed the services described in this documentation, which was scribed in my presence. The recorded information has been reviewed and is accurate.    Arthor Captainbigail Tait Balistreri, PA-C 03/29/14 1345  Doug SouSam Jacubowitz, MD 03/29/14 1627

## 2014-04-17 ENCOUNTER — Ambulatory Visit: Payer: Medicaid Other | Admitting: Obstetrics & Gynecology

## 2014-05-14 ENCOUNTER — Telehealth: Payer: Self-pay

## 2014-05-14 NOTE — Telephone Encounter (Signed)
Pt was given a TB test and need to come by and pick up a copy Please call [phone]

## 2014-05-14 NOTE — Telephone Encounter (Signed)
Patients phone is not accepting phone calls at this time.  If patient calls for a missed call her request for TB Test is ready for pick up.  In drawer at 102.

## 2014-05-22 ENCOUNTER — Ambulatory Visit: Payer: Medicaid Other | Admitting: Obstetrics and Gynecology

## 2014-07-28 ENCOUNTER — Encounter (HOSPITAL_COMMUNITY): Payer: Self-pay

## 2014-07-28 ENCOUNTER — Inpatient Hospital Stay (HOSPITAL_COMMUNITY)
Admission: AD | Admit: 2014-07-28 | Discharge: 2014-07-28 | Disposition: A | Discharge status: Home / Self Care | Payer: Self-pay | Source: Ambulatory Visit | Attending: Obstetrics & Gynecology | Admitting: Obstetrics & Gynecology

## 2014-07-28 DIAGNOSIS — R102 Pelvic and perineal pain: Secondary | ICD-10-CM | POA: Insufficient documentation

## 2014-07-28 DIAGNOSIS — A499 Bacterial infection, unspecified: Secondary | ICD-10-CM

## 2014-07-28 DIAGNOSIS — N760 Acute vaginitis: Secondary | ICD-10-CM | POA: Insufficient documentation

## 2014-07-28 DIAGNOSIS — B9689 Other specified bacterial agents as the cause of diseases classified elsewhere: Secondary | ICD-10-CM | POA: Insufficient documentation

## 2014-07-28 DIAGNOSIS — F17210 Nicotine dependence, cigarettes, uncomplicated: Secondary | ICD-10-CM | POA: Insufficient documentation

## 2014-07-28 HISTORY — DX: Female pelvic inflammatory disease, unspecified: N73.9

## 2014-07-28 HISTORY — DX: Anemia, unspecified: D64.9

## 2014-07-28 LAB — URINALYSIS, ROUTINE W REFLEX MICROSCOPIC
Bilirubin Urine: NEGATIVE
Glucose, UA: NEGATIVE mg/dL
Hgb urine dipstick: NEGATIVE
Ketones, ur: NEGATIVE mg/dL
Leukocytes, UA: NEGATIVE
Nitrite: NEGATIVE
PROTEIN: NEGATIVE mg/dL
SPECIFIC GRAVITY, URINE: 1.015 (ref 1.005–1.030)
Urobilinogen, UA: 0.2 mg/dL (ref 0.0–1.0)
pH: 6 (ref 5.0–8.0)

## 2014-07-28 LAB — POCT PREGNANCY, URINE: PREG TEST UR: NEGATIVE

## 2014-07-28 LAB — WET PREP, GENITAL
TRICH WET PREP: NONE SEEN
YEAST WET PREP: NONE SEEN

## 2014-07-28 MED ORDER — METRONIDAZOLE 500 MG PO TABS: 500.0000 mg | ORAL_TABLET | Freq: Two times a day (BID) | ORAL | Status: DC

## 2014-07-28 NOTE — MAU Note (Signed)
Pt presents to MAU with complaints of pelvic pain and nausea for 4 days. Denies any vaginal bleeding or discharge

## 2014-07-28 NOTE — Discharge Instructions (Signed)
Bacterial Vaginosis Bacterial vaginosis is a vaginal infection that occurs when the normal balance of bacteria in the vagina is disrupted. It results from an overgrowth of certain bacteria. This is the most common vaginal infection in women of childbearing age. Treatment is important to prevent complications, especially in pregnant women, as it can cause a premature delivery. CAUSES  Bacterial vaginosis is caused by an increase in harmful bacteria that are normally present in smaller amounts in the vagina. Several different kinds of bacteria can cause bacterial vaginosis. However, the reason that the condition develops is not fully understood. RISK FACTORS Certain activities or behaviors can put you at an increased risk of developing bacterial vaginosis, including:  Having a new sex partner or multiple sex partners.  Douching.  Using an intrauterine device (IUD) for contraception. Women do not get bacterial vaginosis from toilet seats, bedding, swimming pools, or contact with objects around them. SIGNS AND SYMPTOMS  Some women with bacterial vaginosis have no signs or symptoms. Common symptoms include:  Grey vaginal discharge.  A fishlike odor with discharge, especially after sexual intercourse.  Itching or burning of the vagina and vulva.  Burning or pain with urination. DIAGNOSIS  Your health care provider will take a medical history and examine the vagina for signs of bacterial vaginosis. A sample of vaginal fluid may be taken. Your health care provider will look at this sample under a microscope to check for bacteria and abnormal cells. A vaginal pH test may also be done.  TREATMENT  Bacterial vaginosis may be treated with antibiotic medicines. These may be given in the form of a pill or a vaginal cream. A second round of antibiotics may be prescribed if the condition comes back after treatment.  HOME CARE INSTRUCTIONS   Only take over-the-counter or prescription medicines as  directed by your health care provider.  If antibiotic medicine was prescribed, take it as directed. Make sure you finish it even if you start to feel better.  Do not have sex until treatment is completed.  Tell all sexual partners that you have a vaginal infection. They should see their health care provider and be treated if they have problems, such as a mild rash or itching.  Practice safe sex by using condoms and only having one sex partner. SEEK MEDICAL CARE IF:   Your symptoms are not improving after 3 days of treatment.  You have increased discharge or pain.  You have a fever. MAKE SURE YOU:   Understand these instructions.  Will watch your condition.  Will get help right away if you are not doing well or get worse. FOR MORE INFORMATION  Centers for Disease Control and Prevention, Division of STD Prevention: www.cdc.gov/std American Sexual Health Association (ASHA): www.ashastd.org  Document Released: 04/12/2005 Document Revised: 01/31/2013 Document Reviewed: 11/22/2012 Exit[employer] Patient Information 2015 [employer]. This information is not intended to replace advice given to you by your health care provider. Make sure you discuss any questions you have with your health care provider.  

## 2014-07-28 NOTE — MAU Provider Note (Signed)
History     CSN: [id number]  Arrival date and time: 07/28/14 [id number]   First Provider Initiated Contact with Patient 07/28/14 1001      Chief Complaint  Patient presents with  . Pelvic Pain   HPI Kristin Kemp 24 y.o. [id number] nonpregnant female presents to MAU for 4 days of pelvic pain that is worsening with time.  She states it is across her lower abdomen R>L.  It is 7/10, dull and constant.  It is worsened with movement, exertion, using bathroom.  She denies back pain, fever, weakness, vaginal bleeding, dizziness, dysuria, constipation, diarrhea, headache.  No sex x 1 month.  No STD testing in >536months.   OB History    Gravida Para Term Preterm AB TAB SAB Ectopic Multiple Living   2 1 1  1  1   0 1      Past Medical History  Diagnosis Date  . Urinary tract infection   . Ovarian cyst   . Abnormal Pap smear   . Herpes   . Late prenatal care   . Anemia   . PID (pelvic inflammatory disease)     Past Surgical History  Procedure Laterality Date  . Dilation and curettage of uterus  2009    Family History  Problem Relation Age of Onset  . Hypertension Maternal Grandmother   . Hyperlipidemia Maternal Grandmother   . Heart disease Maternal Grandmother     enlarged heart  . Anesthesia problems Neg Hx   . Diabetes Maternal Uncle   . Hypertension Maternal Uncle   . Kidney disease Maternal Uncle   . Cancer Paternal Aunt     lung  . Cancer Paternal Uncle     lung  . Diabetes Maternal Grandfather     History  Substance Use Topics  . Smoking status: Current Every Day Smoker -- 0.25 packs/day    Types: Cigarettes    Last Attempt to Quit: 10/21/2010  . Smokeless tobacco: Never Used  . Alcohol Use: Yes     Comment: occasionally    Allergies:  Allergies  Allergen Reactions  . Chocolate Hives and Itching  . Cinnamon Other (See Comments)    Turns tongue raw.  . Latex Other (See Comments)    Reports vaginal sensitivity after latex condom use.    Prescriptions  prior to admission  Medication Sig Dispense Refill Last Dose  . tetrahydrozoline-zinc (VISINE-AC) 0.05-0.25 % ophthalmic solution Place 1 drop into both eyes 3 (three) times daily as needed.   07/27/2014 at Unknown time  . albuterol (PROVENTIL HFA;VENTOLIN HFA) 108 (90 BASE) MCG/ACT inhaler Inhale 1-2 puffs into the lungs every 6 (six) hours as needed for wheezing or shortness of breath. 1 Inhaler 0 rescue  . doxycycline (MONODOX) 100 MG capsule Take 1 capsule (100 mg total) by mouth 2 (two) times daily. (Patient not taking: Reported on 07/28/2014) 28 capsule 0 Completed Course at Unknown time  . erythromycin ophthalmic ointment Place a 1/2 inch ribbon of ointment into the lower eyelid every 4 hours for 5 days (Patient not taking: Reported on 07/28/2014) 3.5 g 1 Not Taking at Unknown time    ROS Pertinent ROS in HPI  Physical Exam   Blood pressure 125/73, pulse 80, temperature 98.3 F (36.8 C), resp. rate 18, height 5\' 3"  (1.6 m), weight 115 lb (52.164 kg), last menstrual period 07/14/2014.  Physical Exam  Constitutional: She is oriented to person, place, and time. She appears well-developed and well-nourished. No distress.  HENT:  Head: Normocephalic  and atraumatic.  Eyes: EOM are normal.  Neck: Normal range of motion.  Cardiovascular: Normal rate and regular rhythm.   Respiratory: Effort normal and breath sounds normal. No respiratory distress. She has no wheezes. She has no rales.  GI: Soft. Bowel sounds are normal. She exhibits no distension and no mass. There is tenderness. There is no rebound and no guarding.  RLQ tenderness to palpation  Genitourinary:  Large amt of yellowish/green creamy homogenous discharge with some frothing noted.  Cervix is smooth without erythema or mucus present in os.  No CMT.  No adnexal mass although there is some mild tenderness throughout.    Musculoskeletal: Normal range of motion.  Neurological: She is alert and oriented to person, place, and time.  Skin:  Skin is warm and dry.  Psychiatric: She has a normal mood and affect.   Results for orders placed or performed during the hospital encounter of 07/28/14 (from the past 24 hour(s))  Urinalysis, Routine w reflex microscopic     Status: None   Collection Time: 07/28/14  9:30 AM  Result Value Ref Range   Color, Urine YELLOW YELLOW   APPearance CLEAR CLEAR   Specific Gravity, Urine 1.015 1.005 - 1.030   pH 6.0 5.0 - 8.0   Glucose, UA NEGATIVE NEGATIVE mg/dL   Hgb urine dipstick NEGATIVE NEGATIVE   Bilirubin Urine NEGATIVE NEGATIVE   Ketones, ur NEGATIVE NEGATIVE mg/dL   Protein, ur NEGATIVE NEGATIVE mg/dL   Urobilinogen, UA 0.2 0.0 - 1.0 mg/dL   Nitrite NEGATIVE NEGATIVE   Leukocytes, UA NEGATIVE NEGATIVE  Pregnancy, urine POC     Status: None   Collection Time: 07/28/14 10:00 AM  Result Value Ref Range   Preg Test, Ur NEGATIVE NEGATIVE  Wet prep, genital     Status: Abnormal   Collection Time: 07/28/14 10:08 AM  Result Value Ref Range   Yeast Wet Prep HPF POC NONE SEEN NONE SEEN   Trich, Wet Prep NONE SEEN NONE SEEN   Clue Cells Wet Prep HPF POC MODERATE (A) NONE SEEN   WBC, Wet Prep HPF POC TOO NUMEROUS TO COUNT (A) NONE SEEN    MAU Course  Procedures  MDM Bacterial vaginosis confirmed with wet prep High index of suspicion for trichomonas based on clinical exam, wbc's on wet prep,  and symptoms although not confirmed on wet prep.    Assessment and Plan  A: Bacterial vaginosis, Questionable Trichomonas  P: Discharge to home Flagyl x 1 week No sex, etoh x 10 days Other labs pending.  Will call with positive result. F/u at HD for birth control, STD testing Patient may return to MAU as needed or if her condition were to change or worsen   Bertram Denver 07/28/2014, 10:22 AM

## 2014-07-29 LAB — GC/CHLAMYDIA PROBE AMP (~~LOC~~) NOT AT ARMC
Chlamydia: NEGATIVE
NEISSERIA GONORRHEA: POSITIVE — AB

## 2014-07-31 ENCOUNTER — Ambulatory Visit (INDEPENDENT_AMBULATORY_CARE_PROVIDER_SITE_OTHER): Payer: Self-pay | Admitting: General Practice

## 2014-07-31 VITALS — BP 127/69 | HR 79 | Temp 98.0°F | Ht 63.0 in | Wt 116.4 lb

## 2014-07-31 DIAGNOSIS — A549 Gonococcal infection, unspecified: Secondary | ICD-10-CM

## 2014-07-31 MED ORDER — AZITHROMYCIN 250 MG PO TABS
1000.0000 mg | ORAL_TABLET | Freq: Once | ORAL | Status: AC
  Administered 2014-07-31: 1000 mg via ORAL

## 2014-07-31 MED ORDER — CEFTRIAXONE SODIUM 1 G IJ SOLR
250.0000 mg | Freq: Once | INTRAMUSCULAR | Status: AC
  Administered 2014-07-31: 250 mg via INTRAMUSCULAR

## 2014-10-05 ENCOUNTER — Encounter (HOSPITAL_COMMUNITY): Payer: Self-pay | Admitting: Emergency Medicine

## 2014-10-05 ENCOUNTER — Emergency Department (HOSPITAL_COMMUNITY)
Admission: EM | Admit: 2014-10-05 | Discharge: 2014-10-05 | Disposition: A | Discharge status: Home / Self Care | Payer: Medicaid Other | Attending: Emergency Medicine | Admitting: Emergency Medicine

## 2014-10-05 ENCOUNTER — Emergency Department (HOSPITAL_COMMUNITY): Payer: Medicaid Other

## 2014-10-05 DIAGNOSIS — Z8742 Personal history of other diseases of the female genital tract: Secondary | ICD-10-CM | POA: Insufficient documentation

## 2014-10-05 DIAGNOSIS — Z79899 Other long term (current) drug therapy: Secondary | ICD-10-CM | POA: Insufficient documentation

## 2014-10-05 DIAGNOSIS — J069 Acute upper respiratory infection, unspecified: Secondary | ICD-10-CM

## 2014-10-05 DIAGNOSIS — R000 Tachycardia, unspecified: Secondary | ICD-10-CM | POA: Insufficient documentation

## 2014-10-05 DIAGNOSIS — Z792 Long term (current) use of antibiotics: Secondary | ICD-10-CM | POA: Insufficient documentation

## 2014-10-05 DIAGNOSIS — Z8619 Personal history of other infectious and parasitic diseases: Secondary | ICD-10-CM | POA: Insufficient documentation

## 2014-10-05 DIAGNOSIS — J40 Bronchitis, not specified as acute or chronic: Secondary | ICD-10-CM

## 2014-10-05 DIAGNOSIS — Z862 Personal history of diseases of the blood and blood-forming organs and certain disorders involving the immune mechanism: Secondary | ICD-10-CM | POA: Insufficient documentation

## 2014-10-05 DIAGNOSIS — Z720 Tobacco use: Secondary | ICD-10-CM | POA: Insufficient documentation

## 2014-10-05 DIAGNOSIS — Z87440 Personal history of urinary (tract) infections: Secondary | ICD-10-CM | POA: Insufficient documentation

## 2014-10-05 DIAGNOSIS — Z91040 Latex allergy status: Secondary | ICD-10-CM | POA: Insufficient documentation

## 2014-10-05 MED ORDER — PREDNISONE 20 MG PO TABS
60.0000 mg | ORAL_TABLET | Freq: Once | ORAL | Status: AC
  Administered 2014-10-05: 60 mg via ORAL
  Filled 2014-10-05: qty 3

## 2014-10-05 MED ORDER — ALBUTEROL SULFATE (2.5 MG/3ML) 0.083% IN NEBU
5.0000 mg | INHALATION_SOLUTION | Freq: Once | RESPIRATORY_TRACT | Status: AC
  Administered 2014-10-05: 5 mg via RESPIRATORY_TRACT
  Filled 2014-10-05: qty 6

## 2014-10-05 MED ORDER — ALBUTEROL SULFATE HFA 108 (90 BASE) MCG/ACT IN AERS: 2.0000 | INHALATION_SPRAY | RESPIRATORY_TRACT | Status: DC | PRN

## 2014-10-05 MED ORDER — BENZONATATE 100 MG PO CAPS: 200.0000 mg | ORAL_CAPSULE | Freq: Two times a day (BID) | ORAL | Status: DC | PRN

## 2014-10-05 MED ORDER — IPRATROPIUM BROMIDE 0.02 % IN SOLN
0.5000 mg | Freq: Once | RESPIRATORY_TRACT | Status: AC
  Administered 2014-10-05: 0.5 mg via RESPIRATORY_TRACT
  Filled 2014-10-05: qty 2.5

## 2014-10-05 MED ORDER — PREDNISONE 20 MG PO TABS: 40.0000 mg | ORAL_TABLET | Freq: Every day | ORAL | Status: DC

## 2014-10-05 NOTE — ED Notes (Signed)
Declined W/C at D/C and was escorted to lobby by RN. 

## 2014-10-05 NOTE — ED Provider Notes (Signed)
CSN: [id number]     Arrival date & time 10/05/14  1115 History   First MD Initiated Contact with Patient 10/05/14 1136     Chief Complaint  Patient presents with  . Shortness of Breath  . Cough     (Consider location/radiation/quality/duration/timing/severity/associated sxs/prior Treatment) HPI   Kristin Kemp is a 24 year old female, current smoker, who presents with 2 days of progressive cough and shortness of breath, that began with upper respiratory infection symptoms of runny nose, fever, nausea and one episode of vomiting. 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  2 days ago she had a fever of 100, but has not had any fever since and never experienced any sweats or chills. She has slightly decreased her amount of smoking, and she reports her cough and shortness of breath are worse when she is around her friends and family members who smoke. She has had bronchitis before, but this feels worse because of the pressure in her chest, which is described as a tightness that gets worse when she lies down to sleep at night.  The tightness is located in her central chest and does not radiate, and is not made worse with coughing, movement or palpation. Her cough is productive with brown sputum that has now turned more yellow. Mucus from both blowing her nose and coughing has had some blood streaks in it. She denies any foreign travel.  Over a week ago she drove to [place] and back.  She is not on any oral birth control, and does not have a personal or family history of blood clots, she does not have any leg swelling or palpitations. She is not complaining of abdominal pain, vomiting or diarrhea at this time. She does not have a headache, ear pain, sinus pressure or sore throat., And does not complain of any other constitutional symptoms such as weakness, dizziness, fatigue and lightheadedness or syncopal episodes.  Past Medical History  Diagnosis  Date  . Urinary tract infection   . Ovarian cyst   . Abnormal Pap smear   . Herpes   . Late prenatal care   . Anemia   . PID (pelvic inflammatory disease)    Past Surgical History  Procedure Laterality Date  . Dilation and curettage of uterus  2009   Family History  Problem Relation Age of Onset  . Hypertension Maternal Grandmother   . Hyperlipidemia Maternal Grandmother   . Heart disease Maternal Grandmother     enlarged heart  . Anesthesia problems Neg Hx   . Diabetes Maternal Uncle   . Hypertension Maternal Uncle   . Kidney disease Maternal Uncle   . Cancer Paternal Aunt     lung  . Cancer Paternal Uncle     lung  . Diabetes Maternal Grandfather    History  Substance Use Topics  . Smoking status: Current Every Day Smoker -- 0.25 packs/day    Types: Cigarettes    Last Attempt to Quit: 10/21/2010  . Smokeless tobacco: Never Used  . Alcohol Use: Yes     Comment: occasionally   OB History    Gravida Para Term Preterm AB TAB SAB Ectopic Multiple Living   0 1     Review of Systems  All other systems reviewed and are negative.   Allergies  Chocolate; Cinnamon; and Latex  Home Medications   Prior to Admission medications   Medication Sig  Start Date End Date Taking? Authorizing Provider  albuterol (PROVENTIL HFA;VENTOLIN HFA) 108 (90 BASE) MCG/ACT inhaler Inhale 2 puffs into the lungs every 4 (four) hours as needed for wheezing or shortness of breath. 10/05/14   Danelle Berry, PA-C  benzonatate (TESSALON) 100 MG capsule Take 2 capsules (200 mg total) by mouth 2 (two) times daily as needed for cough. 10/05/14   Danelle Berry, PA-C  metroNIDAZOLE (FLAGYL) 500 MG tablet Take 1 tablet (500 mg total) by mouth 2 (two) times daily. 07/28/14   Bertram Denver, PA-C  predniSONE (DELTASONE) 20 MG tablet Take 2 tablets (40 mg total) by mouth daily. 10/05/14   Danelle Berry, PA-C  tetrahydrozoline-zinc (VISINE-AC) 0.05-0.25 % ophthalmic solution Place 1 drop into both  eyes 3 (three) times daily as needed.    Historical Provider, MD   BP 122/61 mmHg  Pulse 100  Temp(Src) 98.4 F (36.9 C) (Oral)  Resp 16  Ht 5\' 3"  (1.6 m)  Wt 115 lb (52.164 kg)  BMI 20.38 kg/m2  SpO2 100%  LMP 09/09/2014 (Exact Date)   Physical Exam  Constitutional: She is oriented to person, place, and time. She appears well-developed and well-nourished. No distress.  HENT:  Head: Normocephalic and atraumatic.  Right Ear: Tympanic membrane, external ear and ear canal normal.  Left Ear: Tympanic membrane, external ear and ear canal normal.  Nose: Mucosal edema and rhinorrhea present. No sinus tenderness. Right sinus exhibits no maxillary sinus tenderness and no frontal sinus tenderness. Left sinus exhibits no maxillary sinus tenderness and no frontal sinus tenderness.  Mouth/Throat: Uvula is midline, oropharynx is clear and moist and mucous membranes are normal. Mucous membranes are not pale and not dry.  Eyes: Conjunctivae and EOM are normal. Pupils are equal, round, and reactive to light. No scleral icterus.  Neck: Normal range of motion. Neck supple.  Cardiovascular: Regular rhythm and normal heart sounds.  Tachycardia present.  Exam reveals no gallop and no friction rub.   No murmur heard. Pulmonary/Chest: Effort normal and breath sounds normal. No respiratory distress. She has no wheezes. She has no rales. She exhibits no tenderness.  Frequent cough  Abdominal: Soft. Bowel sounds are normal. She exhibits no distension. There is no tenderness.  Musculoskeletal: Normal range of motion.  Neurological: She is alert and oriented to person, place, and time.  Skin: Skin is warm and dry. No rash noted. She is not diaphoretic. No erythema. No pallor.  Psychiatric: She has a normal mood and affect. Her behavior is normal. Judgment and thought content normal.    ED Course  Procedures (including critical care time) Labs Review Labs Reviewed - No data to display  Imaging Review No  results found.   EKG Interpretation None      MDM   Final diagnoses:  URI, acute  Bronchitis    Shortness of breath and cough following URI 2 days ago in young healthy female who smokes. Physical exam reassuring lungs clear to auscultation, URI signs w/HENT exam - red nasal mucosa bilaterally with clear discharge, patient is afebrile, sat's 97% on room air, mildly tachycardic with triage vitals, will obtain another set. Chest x-ray was ordered in triage, will order breathing treatment and reevaluate her chest tightness, do not anticipate need for Abx, hope to d/c home with inhaler and cough medicine. Upon further chart review, see patient has been given prescriptions for albuterol inhalers in the recent past, however patient did deny inhaler use. Pt chest tightness improved after breathing tx. Discussed SOB  return precautions with pt.   Discussed bronchitis, URI sx treatment, with Rx for albuterol, prednisone, and tessalon.  Pt vitals reviewed and she was discharged home.  Danelle Berry, PA-C       Danelle Berry, PA-C 10/10/14 0111  Blake Divine, MD 10/10/14 1324

## 2014-10-05 NOTE — Discharge Instructions (Signed)
How to Use an Inhaler Proper inhaler technique is very important. Good technique ensures that the medicine reaches the lungs. Poor technique results in depositing the medicine on the tongue and back of the throat rather than in the airways. If you do not use the inhaler with good technique, the medicine will not help you. STEPS TO FOLLOW IF USING AN INHALER WITHOUT AN EXTENSION TUBE  Remove the cap from the inhaler.  If you are using the inhaler for the first time, you will need to prime it. Shake the inhaler for 5 seconds and release four puffs into the air, away from your face. Ask your health care provider or pharmacist if you have questions about priming your inhaler.  Shake the inhaler for 5 seconds before each breath in (inhalation).  Position the inhaler so that the top of the canister faces up.  Put your index finger on the top of the medicine canister. Your thumb supports the bottom of the inhaler.  Open your mouth.  Either place the inhaler between your teeth and place your lips tightly around the mouthpiece, or hold the inhaler 1-2 inches away from your open mouth. If you are unsure of which technique to use, ask your health care provider.  Breathe out (exhale) normally and as completely as possible.  Press the canister down with your index finger to release the medicine.  At the same time as the canister is pressed, inhale deeply and slowly until your lungs are completely filled. This should take 4-6 seconds. Keep your tongue down.  Hold the medicine in your lungs for 5-10 seconds (10 seconds is best). This helps the medicine get into the small airways of your lungs.  Breathe out slowly, through pursed lips. Whistling is an example of pursed lips.  Wait at least 15-30 seconds between puffs. Continue with the above steps until you have taken the number of puffs your health care provider has ordered. Do not use the inhaler more than your health care provider tells  you.  Replace the cap on the inhaler.  Follow the directions from your health care provider or the inhaler insert for cleaning the inhaler. STEPS TO FOLLOW IF USING AN INHALER WITH AN EXTENSION (SPACER)  Remove the cap from the inhaler.  If you are using the inhaler for the first time, you will need to prime it. Shake the inhaler for 5 seconds and release four puffs into the air, away from your face. Ask your health care provider or pharmacist if you have questions about priming your inhaler.  Shake the inhaler for 5 seconds before each breath in (inhalation).  Place the open end of the spacer onto the mouthpiece of the inhaler.  Position the inhaler so that the top of the canister faces up and the spacer mouthpiece faces you.  Put your index finger on the top of the medicine canister. Your thumb supports the bottom of the inhaler and the spacer.  Breathe out (exhale) normally and as completely as possible.  Immediately after exhaling, place the spacer between your teeth and into your mouth. Close your lips tightly around the spacer.  Press the canister down with your index finger to release the medicine.  At the same time as the canister is pressed, inhale deeply and slowly until your lungs are completely filled. This should take 4-6 seconds. Keep your tongue down and out of the way.  Hold the medicine in your lungs for 5-10 seconds (10 seconds is best). This helps the  medicine get into the small airways of your lungs. Exhale. °· Repeat inhaling deeply through the spacer mouthpiece. Again hold that breath for up to 10 seconds (10 seconds is best). Exhale slowly. If it is difficult to take this second deep breath through the spacer, breathe normally several times through the spacer. Remove the spacer from your mouth. °· Wait at least 15-30 seconds between puffs. Continue with the above steps until you have taken the number of puffs your health care provider has ordered. Do not use the  inhaler more than your health care provider tells you. °· Remove the spacer from the inhaler, and place the cap on the inhaler. °· Follow the directions from your health care provider or the inhaler insert for cleaning the inhaler and spacer. °If you are using different kinds of inhalers, use your quick relief medicine to open the airways 10-15 minutes before using a steroid if instructed to do so by your health care provider. If you are unsure which inhalers to use and the order of using them, ask your health care provider, nurse, or respiratory therapist. °If you are using a steroid inhaler, always rinse your mouth with water after your last puff, then gargle and spit out the water. Do not swallow the water. °AVOID: °· Inhaling before or after starting the spray of medicine. It takes practice to coordinate your breathing with triggering the spray. °· Inhaling through the nose (rather than the mouth) when triggering the spray. °HOW TO DETERMINE IF YOUR INHALER IS FULL OR NEARLY EMPTY °You cannot know when an inhaler is empty by shaking it. A few inhalers are now being made with dose counters. Ask your health care provider for a prescription that has a dose counter if you feel you need that extra help. If your inhaler does not have a counter, ask your health care provider to help you determine the date you need to refill your inhaler. Write the refill date on a calendar or your inhaler canister. Refill your inhaler 7-10 days before it runs out. Be sure to keep an adequate supply of medicine. This includes making sure it is not expired, and that you have a spare inhaler.  °SEEK MEDICAL CARE IF:  °· Your symptoms are only partially relieved with your inhaler. °· You are having trouble using your inhaler. °· You have some increase in phlegm. °SEEK IMMEDIATE MEDICAL CARE IF:  °· You feel little or no relief with your inhalers. You are still wheezing and are feeling shortness of breath or tightness in your chest or  both. °· You have dizziness, headaches, or a fast heart rate. °· You have chills, fever, or night sweats. °· You have a noticeable increase in phlegm production, or there is blood in the phlegm. °MAKE SURE YOU:  °· Understand these instructions. °· Will watch your condition. °· Will get help right away if you are not doing well or get worse. °Document Released: 04/09/2000 Document Revised: 01/31/2013 Document Reviewed: 11/09/2012 °Exit[employer]® Patient Information ©2015 [employer]. This information is not intended to replace advice given to you by your health care provider. Make sure you discuss any questions you have with your health care provider. ° °Upper Respiratory Infection, Adult °An upper respiratory infection (URI) is also known as the common cold. It is often caused by a type of germ (virus). Colds are easily spread (contagious). You can pass it to others by kissing, coughing, sneezing, or drinking out of the same glass. Usually, you get better in   1 or 2 weeks.  HOME CARE   Only take medicine as told by your doctor.  Use a warm mist humidifier or breathe in steam from a hot shower.  Drink enough water and fluids to keep your pee (urine) clear or pale yellow.  Get plenty of rest.  Return to work when your temperature is back to normal or as told by your doctor. You may use a face mask and wash your hands to stop your cold from spreading. GET HELP RIGHT AWAY IF:   After the first few days, you feel you are getting worse.  You have questions about your medicine.  You have chills, shortness of breath, or brown or red spit (mucus).  You have yellow or brown snot (nasal discharge) or pain in the face, especially when you bend forward.  You have a fever, puffy (swollen) neck, pain when you swallow, or white spots in the back of your throat.  You have a bad headache, ear pain, sinus pain, or chest pain.  You have a high-pitched whistling sound when you breathe in and out (wheezing).  You  have a lasting cough or cough up blood.  You have sore muscles or a stiff neck. MAKE SURE YOU:   Understand these instructions.  Will watch your condition.  Will get help right away if you are not doing well or get worse. Document Released: 09/29/2007 Document Revised: 07/05/2011 Document Reviewed: 07/18/2013 [facility] Patient Information 2015 [place], [place]. This information is not intended to replace advice given to you by your health care provider. Make sure you discuss any questions you have with your health care provider.

## 2014-10-05 NOTE — ED Notes (Signed)
Pt c/o shortness of breath onset Thursday. Pt reports that she hears herself wheezing when she lays down. Pt with dry cough. Denies history of asthma.

## 2015-01-28 ENCOUNTER — Encounter: Payer: Self-pay | Admitting: Emergency Medicine

## 2015-02-20 ENCOUNTER — Encounter (HOSPITAL_COMMUNITY): Payer: Self-pay | Admitting: Emergency Medicine

## 2015-02-20 DIAGNOSIS — R630 Anorexia: Secondary | ICD-10-CM | POA: Insufficient documentation

## 2015-02-20 DIAGNOSIS — Z3202 Encounter for pregnancy test, result negative: Secondary | ICD-10-CM | POA: Insufficient documentation

## 2015-02-20 DIAGNOSIS — R1013 Epigastric pain: Secondary | ICD-10-CM | POA: Insufficient documentation

## 2015-02-20 DIAGNOSIS — F17210 Nicotine dependence, cigarettes, uncomplicated: Secondary | ICD-10-CM | POA: Insufficient documentation

## 2015-02-20 DIAGNOSIS — Z87440 Personal history of urinary (tract) infections: Secondary | ICD-10-CM | POA: Insufficient documentation

## 2015-02-20 DIAGNOSIS — Z862 Personal history of diseases of the blood and blood-forming organs and certain disorders involving the immune mechanism: Secondary | ICD-10-CM | POA: Insufficient documentation

## 2015-02-20 DIAGNOSIS — Z91040 Latex allergy status: Secondary | ICD-10-CM | POA: Insufficient documentation

## 2015-02-20 DIAGNOSIS — Z8742 Personal history of other diseases of the female genital tract: Secondary | ICD-10-CM | POA: Insufficient documentation

## 2015-02-20 DIAGNOSIS — R110 Nausea: Secondary | ICD-10-CM | POA: Insufficient documentation

## 2015-02-20 DIAGNOSIS — Z8619 Personal history of other infectious and parasitic diseases: Secondary | ICD-10-CM | POA: Insufficient documentation

## 2015-02-20 LAB — CBC
HEMATOCRIT: 35.8 % — AB (ref 36.0–46.0)
HEMOGLOBIN: 12.4 g/dL (ref 12.0–15.0)
MCH: 27.9 pg (ref 26.0–34.0)
MCHC: 34.6 g/dL (ref 30.0–36.0)
MCV: 80.6 fL (ref 78.0–100.0)
Platelets: 290 10*3/uL (ref 150–400)
RBC: 4.44 MIL/uL (ref 3.87–5.11)
RDW: 14 % (ref 11.5–15.5)
WBC: 8.8 10*3/uL (ref 4.0–10.5)

## 2015-02-20 NOTE — ED Notes (Signed)
Pt. reports upper abdominal pain " knot inside" with nausea onset today , denies emesis or diarrhea , no fever or urinary discomfort.

## 2015-02-21 ENCOUNTER — Emergency Department (HOSPITAL_COMMUNITY): Payer: Self-pay

## 2015-02-21 ENCOUNTER — Emergency Department (HOSPITAL_COMMUNITY)
Admission: EM | Admit: 2015-02-21 | Discharge: 2015-02-21 | Disposition: A | Discharge status: Home / Self Care | Payer: Self-pay | Attending: Emergency Medicine | Admitting: Emergency Medicine

## 2015-02-21 DIAGNOSIS — R1013 Epigastric pain: Secondary | ICD-10-CM

## 2015-02-21 LAB — URINALYSIS, ROUTINE W REFLEX MICROSCOPIC
Bilirubin Urine: NEGATIVE
GLUCOSE, UA: NEGATIVE mg/dL
Hgb urine dipstick: NEGATIVE
Ketones, ur: 40 mg/dL — AB
LEUKOCYTES UA: NEGATIVE
Nitrite: NEGATIVE
Protein, ur: NEGATIVE mg/dL
Specific Gravity, Urine: 1.014 (ref 1.005–1.030)
Urobilinogen, UA: 0.2 mg/dL (ref 0.0–1.0)
pH: 6 (ref 5.0–8.0)

## 2015-02-21 LAB — COMPREHENSIVE METABOLIC PANEL
ALT: 26 U/L (ref 14–54)
AST: 26 U/L (ref 15–41)
Albumin: 4.2 g/dL (ref 3.5–5.0)
Alkaline Phosphatase: 48 U/L (ref 38–126)
Anion gap: 10 (ref 5–15)
BUN: 8 mg/dL (ref 6–20)
CHLORIDE: 107 mmol/L (ref 101–111)
CO2: 24 mmol/L (ref 22–32)
Calcium: 9.9 mg/dL (ref 8.9–10.3)
Creatinine, Ser: 0.74 mg/dL (ref 0.44–1.00)
GFR calc Af Amer: >60 mL/min (ref >60)
Glucose, Bld: 80 mg/dL (ref 65–99)
Potassium: 3.7 mmol/L (ref 3.5–5.1)
SODIUM: 141 mmol/L (ref 135–145)
Total Bilirubin: 0.9 mg/dL (ref 0.3–1.2)
Total Protein: 6.8 g/dL (ref 6.5–8.1)

## 2015-02-21 LAB — POC URINE PREG, ED: PREG TEST UR: NEGATIVE

## 2015-02-21 LAB — LIPASE, BLOOD: LIPASE: 31 U/L (ref 11–51)

## 2015-02-21 MED ORDER — GI COCKTAIL ~~LOC~~
30.0000 mL | Freq: Once | ORAL | Status: AC
  Administered 2015-02-21: 30 mL via ORAL
  Filled 2015-02-21: qty 30

## 2015-02-21 NOTE — ED Provider Notes (Signed)
History  By signing my name below, I, Kristin Kemp, attest that this documentation has been prepared under the direction and in the presence of Geoffery Lyons, MD. Electronically Signed: Karle Kemp, ED Scribe. 02/21/2015. 3:38 AM  Chief Complaint  Patient presents with  . Abdominal Pain   Patient is a 24 y.o. female presenting with abdominal pain. The history is provided by the patient and medical records. No language interpreter was used.  Abdominal Pain Pain location:  Epigastric Pain radiates to:  Does not radiate Pain severity:  Moderate Onset quality:  Sudden Duration:  18 hours Timing:  Constant Chronicity:  New Context: not previous surgeries   Relieved by:  None tried Worsened by:  Palpation Ineffective treatments:  None tried Associated symptoms: nausea   Associated symptoms: no vomiting   Risk factors: has not had multiple surgeries and not pregnant     HPI Comments:  Kristin Kemp is a 24 y.o. female who presents to the Emergency Department complaining of upper abdominal pain that began upon waking yesterday morning. She reports feeling a "knot" in her stomach. She reports associated nausea and decreased appetite. She has not done anything to treat the symptoms. Touching the area increases the pain. She denies alleviating factors. She denies any abdominal surgeries. She denies vomiting, diarrhea or fever.   Past Medical History  Diagnosis Date  . Urinary tract infection   . Ovarian cyst   . Abnormal Pap smear   . Herpes   . Late prenatal care   . Anemia   . PID (pelvic inflammatory disease)    Past Surgical History  Procedure Laterality Date  . Dilation and curettage of uterus  2009   Family History  Problem Relation Age of Onset  . Hypertension Maternal Grandmother   . Hyperlipidemia Maternal Grandmother   . Heart disease Maternal Grandmother     enlarged heart  . Anesthesia problems Neg Hx   . Diabetes Maternal Uncle   . Hypertension  Maternal Uncle   . Kidney disease Maternal Uncle   . Cancer Paternal Aunt     lung  . Cancer Paternal Uncle     lung  . Diabetes Maternal Grandfather    Social History  Substance Use Topics  . Smoking status: Current Every Day Smoker -- 0.25 packs/day    Types: Cigarettes    Last Attempt to Quit: 10/21/2010  . Smokeless tobacco: Never Used  . Alcohol Use: Yes   OB History    Gravida Para Term Preterm AB TAB SAB Ectopic Multiple Living   0 1     Review of Systems  Constitutional: Positive for appetite change.  Gastrointestinal: Positive for nausea and abdominal pain. Negative for vomiting.  All other systems reviewed and are negative.   Allergies  Chocolate; Cinnamon; and Latex  Home Medications   Prior to Admission medications   Medication Sig Start Date End Date Taking? Authorizing Provider  albuterol (PROVENTIL HFA;VENTOLIN HFA) 108 (90 BASE) MCG/ACT inhaler Inhale 2 puffs into the lungs every 4 (four) hours as needed for wheezing or shortness of breath. 10/05/14  Yes Danelle Berry, PA-C  Multiple Vitamin (MULTIVITAMIN WITH MINERALS) TABS tablet Take 1 tablet by mouth daily.   Yes Historical Provider, MD  tetrahydrozoline-zinc (VISINE-AC) 0.05-0.25 % ophthalmic solution Place 1 drop into both eyes 3 (three) times daily as needed (dry eyes).    Yes Historical Provider, MD   Triage Vitals: BP 124/84 mmHg  Pulse 81  Temp(Src) 98 F (36.7 C) (Oral)  Resp 18  SpO2 100%  LMP 02/15/2015 (Approximate) Physical Exam  Constitutional: She is oriented to person, place, and time. She appears well-developed and well-nourished.  HENT:  Head: Normocephalic.  Eyes: EOM are normal.  Neck: Normal range of motion.  Pulmonary/Chest: Effort normal.  Abdominal: Soft. There is tenderness. There is no rebound and no guarding.  TTP in epigastric region.  Musculoskeletal: Normal range of motion.  Neurological: She is alert and oriented to person, place, and time.   Psychiatric: She has a normal mood and affect.  Nursing note and vitals reviewed.   ED Course  Procedures (including critical care time) DIAGNOSTIC STUDIES: Oxygen Saturation is 100% on RA, normal by my interpretation.   COORDINATION OF CARE: 1:21 AM- Will order GI cocktail and reassess pt. Pt verbalizes understanding and agrees to plan.  Medications  gi cocktail (Maalox,Lidocaine,Donnatal) (30 mLs Oral Given 02/21/15 0128)   Labs Review Labs Reviewed  CBC - Abnormal; Notable for the following:    HCT 35.8 (*)    All other components within normal limits  URINALYSIS, ROUTINE W REFLEX MICROSCOPIC (NOT AT [facility]) - Abnormal; Notable for the following:    Ketones, ur 40 (*)    All other components within normal limits  LIPASE, BLOOD  COMPREHENSIVE METABOLIC PANEL  POC URINE PREG, ED    Imaging Review [place] Abdomen Complete  02/21/2015  CLINICAL DATA:  Acute onset of right upper quadrant abdominal pain and nausea. Initial encounter. EXAM: ULTRASOUND ABDOMEN COMPLETE COMPARISON:  Abdominal ultrasound performed 02/20/2011 FINDINGS: Gallbladder: No gallstones or wall thickening visualized. No sonographic Murphy sign noted. Common bile duct: Diameter: 0.1 cm, within normal limits in caliber. Liver: No focal lesion identified. Within normal limits in parenchymal echogenicity. IVC: No abnormality visualized. Pancreas: Visualized portion unremarkable. Spleen: Size and appearance within normal limits. Right Kidney: Length: 10.0 cm. Echogenicity within normal limits. No mass or hydronephrosis visualized. Left Kidney: Length: 9.7 cm. Echogenicity within normal limits. No mass or hydronephrosis visualized. Abdominal aorta: No aneurysm visualized. The distal abdominal aorta and aortic bifurcation are not characterized due to overlying structures. Other findings: None. IMPRESSION: Unremarkable abdominal ultrasound. Electronically Signed   By: Roanna RaiderJeffery  Chang M.D.   On: 02/21/2015 03:09   I have  personally reviewed and evaluated these images and lab results as part of my medical decision-making.   EKG Interpretation None      MDM   Final diagnoses:  None    Patient presents here with complaints of epigastric pain. Her workup reveals no elevation of white count, normal LFTs, normal lipase, negative pregnancy test, and clear urinalysis. She does feel somewhat better after GI cocktail, however symptoms have not completely resolved. She then went for an ultrasound of the abdomen which was negative for cholelithiasis. Nothing here appears emergent. I SPECT this may be related to esophageal reflux. She will be treated with Prilosec and when necessary return.  I personally performed the services described in this documentation, which was scribed in my presence. The recorded information has been reviewed and is accurate.      Geoffery Lyonsouglas Riot Barrick, MD 02/21/15 [phone]

## 2015-02-21 NOTE — Discharge Instructions (Signed)
Prilosec 20 mg twice daily for the next 2 weeks.  Pop with your primary Dr. if not improving in the next week, and return to the ER if your symptoms significantly worsen or change.   Abdominal Pain, Adult Many things can cause abdominal pain. Usually, abdominal pain is not caused by a disease and will improve without treatment. It can often be observed and treated at home. Your health care provider will do a physical exam and possibly order blood tests and X-rays to help determine the seriousness of your pain. However, in many cases, more time must pass before a clear cause of the pain can be found. Before that point, your health care provider may not know if you need more testing or further treatment. HOME CARE INSTRUCTIONS Monitor your abdominal pain for any changes. The following actions may help to alleviate any discomfort you are experiencing:  Only take over-the-counter or prescription medicines as directed by your health care provider.  Do not take laxatives unless directed to do so by your health care provider.  Try a clear liquid diet (broth, tea, or water) as directed by your health care provider. Slowly move to a bland diet as tolerated. SEEK MEDICAL CARE IF:  You have unexplained abdominal pain.  You have abdominal pain associated with nausea or diarrhea.  You have pain when you urinate or have a bowel movement.  You experience abdominal pain that wakes you in the night.  You have abdominal pain that is worsened or improved by eating food.  You have abdominal pain that is worsened with eating fatty foods.  You have a fever. SEEK IMMEDIATE MEDICAL CARE IF:  Your pain does not go away within 2 hours.  You keep throwing up (vomiting).  Your pain is felt only in portions of the abdomen, such as the right side or the left lower portion of the abdomen.  You pass bloody or black tarry stools. MAKE SURE YOU:  Understand these instructions.  Will watch your  condition.  Will get help right away if you are not doing well or get worse.   This information is not intended to replace advice given to you by your health care provider. Make sure you discuss any questions you have with your health care provider.   Document Released: 01/20/2005 Document Revised: 01/01/2015 Document Reviewed: 12/20/2012 Elsevier Interactive Patient Education [employer]2016 Elsevier Inc.

## 2015-03-12 IMAGING — US US PELVIS COMPLETE
1 series · 14 of 25 positions shown · non-contrast
Comparison: None

CLINICAL DATA: Pelvic pain

EXAM:
TRANSABDOMINAL AND TRANSVAGINAL ULTRASOUND OF PELVIS
TECHNIQUE: Both transabdominal and transvaginal ultrasound examinations of the
pelvis were performed. Transabdominal technique was performed for
global imaging of the pelvis including uterus, ovaries, adnexal
regions, and pelvic cul-de-sac. It was necessary to proceed with
endovaginal exam following the transabdominal exam to visualize the
ovaries and endometrium.

[Series 1: us pelvis complete · 0.24mm/px · 14 of 72 slices shown]
[im 1/72]
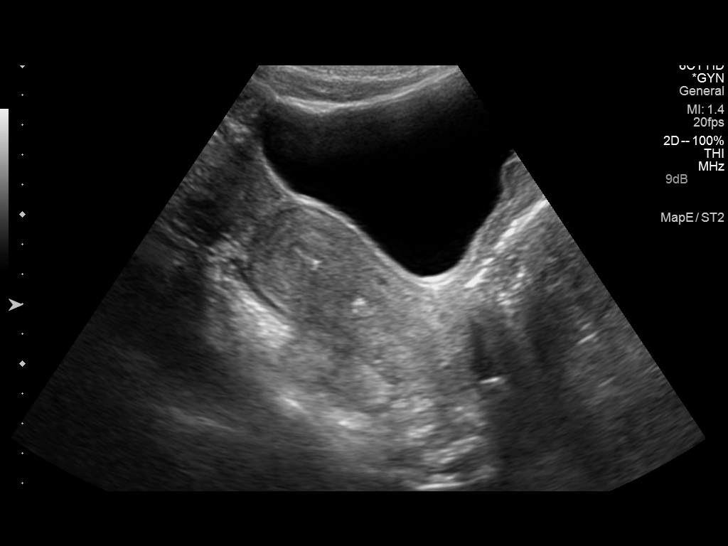
[im 6/72]
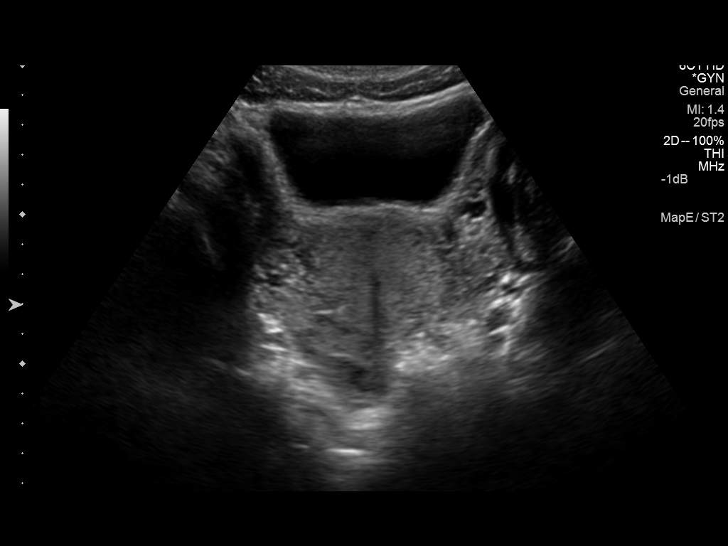
[im 12/72]
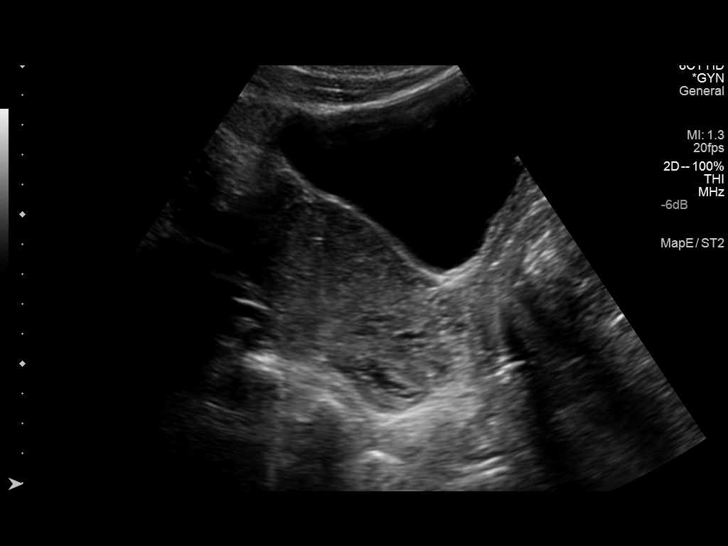
[im 18/72]
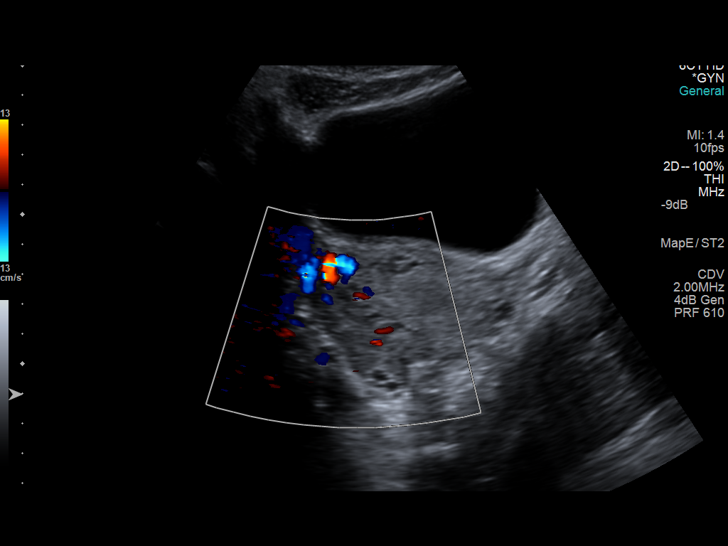
[im 24/72]
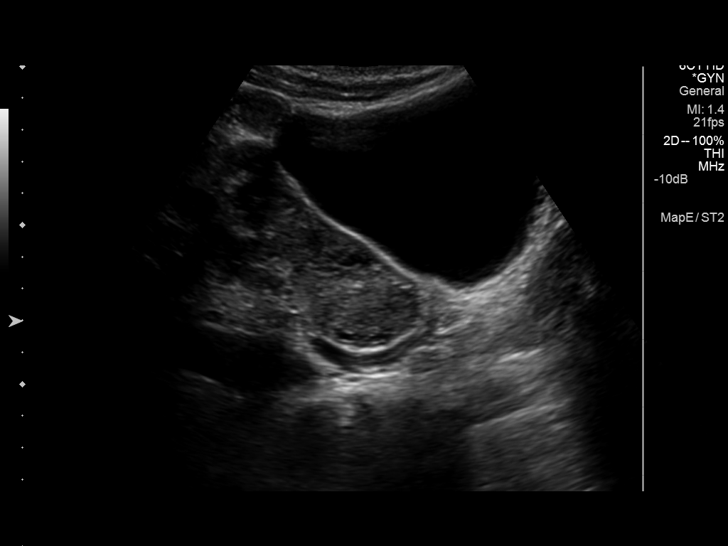
[im 27/72]
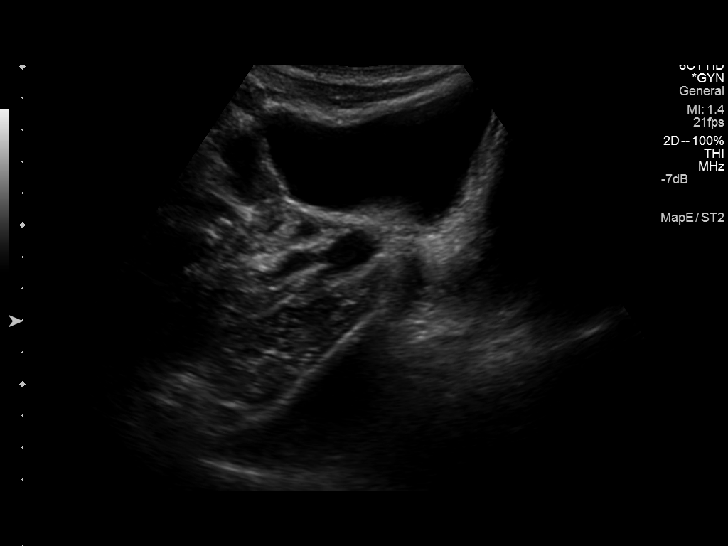
[im 33/72]
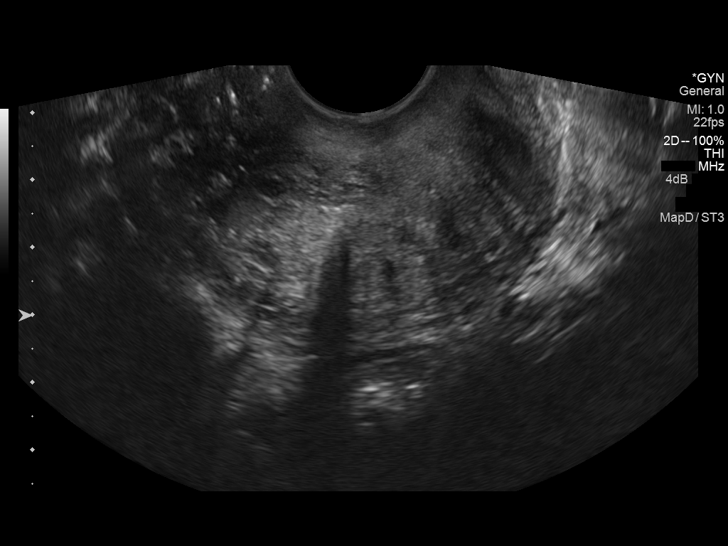
[im 39/72]
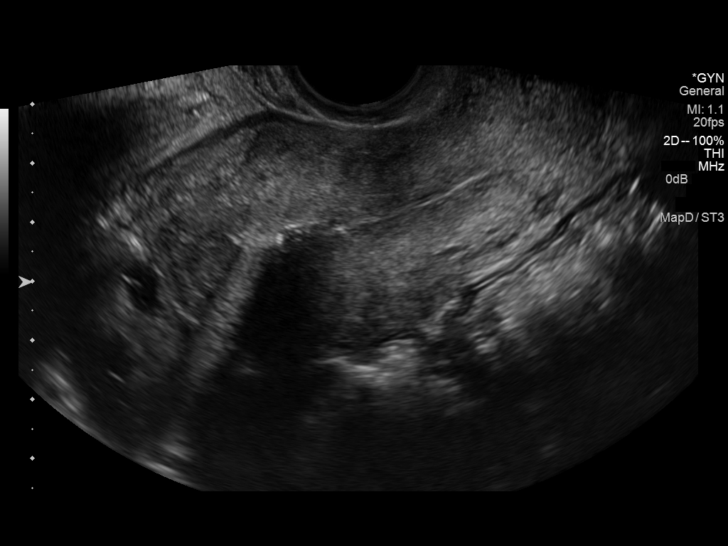
[im 45/72]
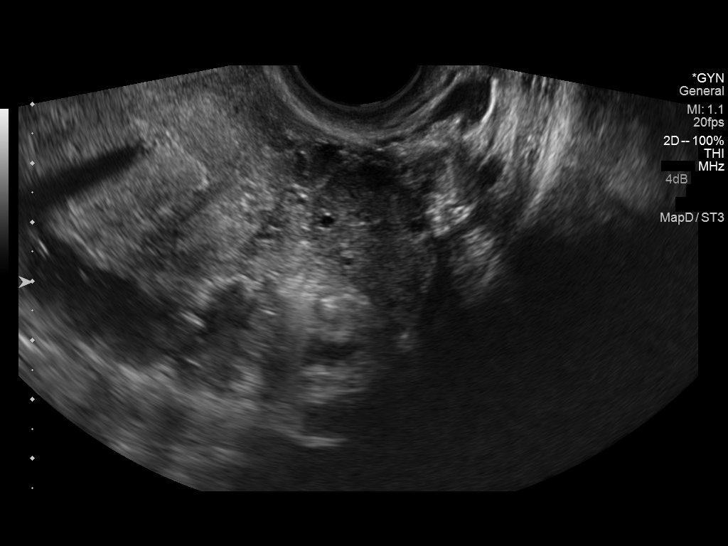
[im 48/72]
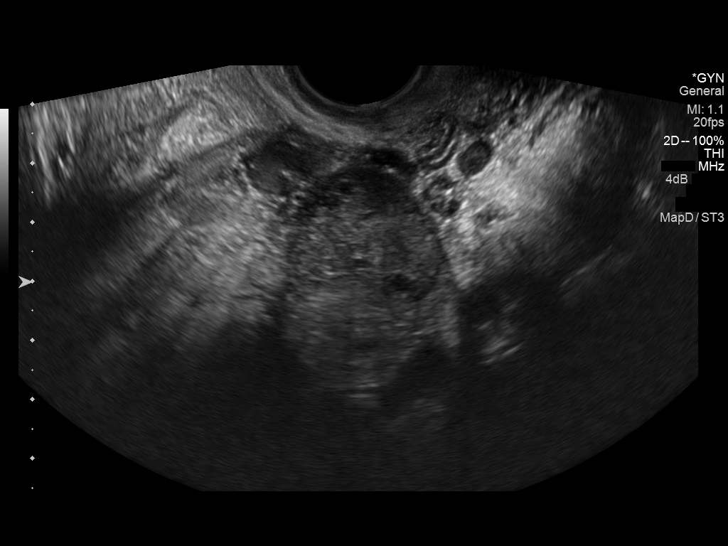
[im 54/72]
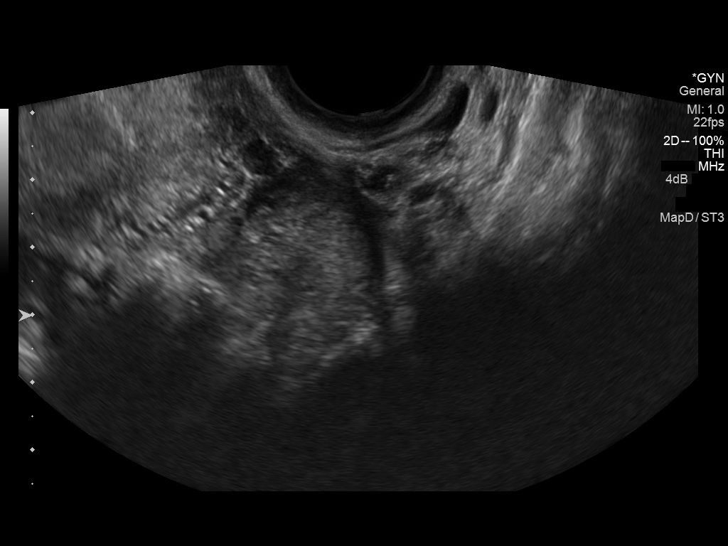
[im 60/72]
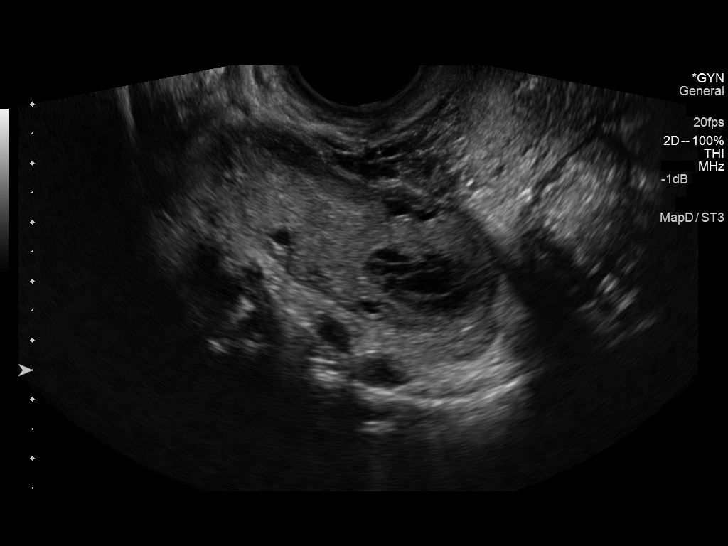
[im 66/72]
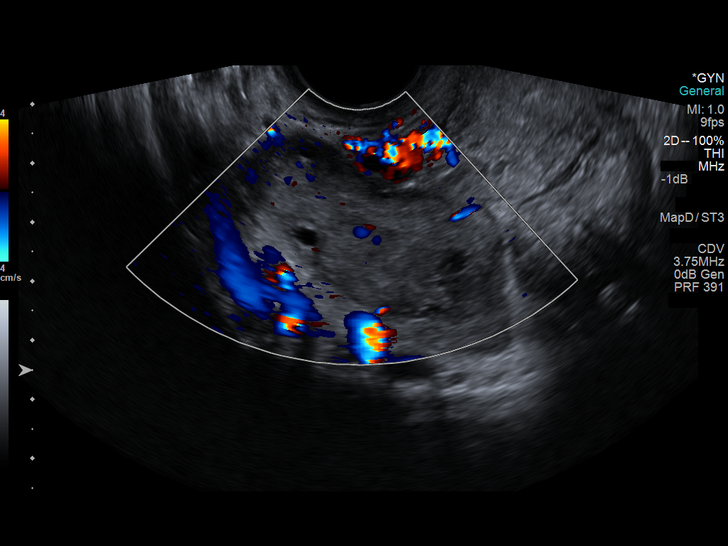
[im 72/72]
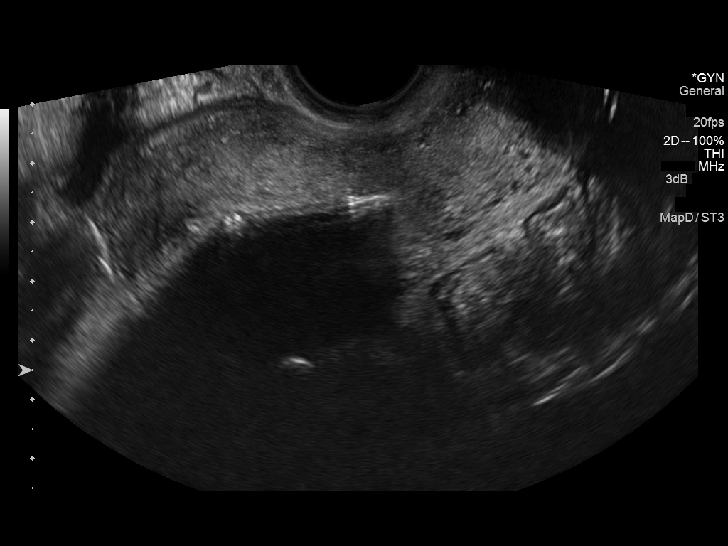

[14 of 25 positions shown; findings below may reference images not displayed]

FINDINGS: Uterus

Measurements: 8.2 x 4.1 x 4.4 cm.. No fibroids or other mass
visualized.

Endometrium

Thickness: 4.2 mm..  An IUD is noted in satisfactory position.

Right ovary

Measurements: 5.4 x 2.3 x 1.7 cm.. A 2.0 cm complex cystic lesion is
noted within the ovary. This likely represents a hemorrhagic cyst.

Left ovary

Measurements: 2.7 x 1.6 x 1.9 cm.. Normal appearance/no adnexal
mass.

Other findings

No free fluid.
IMPRESSION: Complex cystic lesion within the right ovary likely representing a
hemorrhagic cyst. No definitive changes of tubo-ovarian abscess are
seen.

IUD in place.

## 2015-04-28 ENCOUNTER — Emergency Department (HOSPITAL_COMMUNITY)
Admission: EM | Admit: 2015-04-28 | Discharge: 2015-04-28 | Disposition: A | Discharge status: Home / Self Care | Payer: PRIVATE HEALTH INSURANCE

## 2015-04-28 ENCOUNTER — Ambulatory Visit: Payer: PRIVATE HEALTH INSURANCE

## 2015-04-28 NOTE — ED Notes (Signed)
Pt called multiple times with no answer.

## 2015-06-04 ENCOUNTER — Inpatient Hospital Stay (HOSPITAL_COMMUNITY)
Admission: AD | Admit: 2015-06-04 | Discharge: 2015-06-05 | Disposition: A | Discharge status: Home / Self Care | Payer: PRIVATE HEALTH INSURANCE | Source: Ambulatory Visit | Attending: Obstetrics and Gynecology | Admitting: Obstetrics and Gynecology

## 2015-06-04 DIAGNOSIS — F17210 Nicotine dependence, cigarettes, uncomplicated: Secondary | ICD-10-CM | POA: Diagnosis not present

## 2015-06-04 DIAGNOSIS — N939 Abnormal uterine and vaginal bleeding, unspecified: Secondary | ICD-10-CM | POA: Diagnosis present

## 2015-06-04 DIAGNOSIS — Z87440 Personal history of urinary (tract) infections: Secondary | ICD-10-CM | POA: Insufficient documentation

## 2015-06-04 NOTE — MAU Note (Signed)
Pt presents her period was only for a few days and was really not much blood. Neg home preg test.

## 2015-06-05 ENCOUNTER — Encounter (HOSPITAL_COMMUNITY): Payer: Self-pay | Admitting: *Deleted

## 2015-06-05 DIAGNOSIS — N939 Abnormal uterine and vaginal bleeding, unspecified: Secondary | ICD-10-CM

## 2015-06-05 LAB — URINALYSIS, ROUTINE W REFLEX MICROSCOPIC
BILIRUBIN URINE: NEGATIVE
Glucose, UA: NEGATIVE mg/dL
Hgb urine dipstick: NEGATIVE
KETONES UR: NEGATIVE mg/dL
Leukocytes, UA: NEGATIVE
NITRITE: NEGATIVE
PH: 7 (ref 5.0–8.0)
PROTEIN: NEGATIVE mg/dL
SPECIFIC GRAVITY, URINE: 1.01 (ref 1.005–1.030)

## 2015-06-05 LAB — POCT PREGNANCY, URINE: PREG TEST UR: NEGATIVE

## 2015-06-05 NOTE — Discharge Instructions (Signed)

## 2015-06-05 NOTE — MAU Provider Note (Signed)
Chief Complaint: No chief complaint on file.   First Provider Initiated Contact with Patient 06/05/15 0159     SUBJECTIVE HPI: Kristin Kemp is a 25 y.o. G2P1011 female who presents to M[facility] Admissions reporting have a short, light period start 05/30/15. Started at normal time. Has regular 28-day cycles and this has never happened before. Home UPT was neg. States she called the [facility] nurse line and was told to come to MAU for evaluation.     Associated signs and symptoms: Neg for fever, chills, abd pain, vaginal discharge, vaginal odor, vaginal itching, intermenstrual bleeding, dyspareunia or post-coital bleeding.    Past Medical History  Diagnosis Date  . Urinary tract infection   . Ovarian cyst   . Abnormal Pap smear   . Herpes   . Late prenatal care   . Anemia   . PID (pelvic inflammatory disease)    OB History  Gravida Para Term Preterm AB SAB TAB Ectopic Multiple Living  0 1    # Outcome Date GA Lbr Len/2nd Weight Sex Delivery Anes PTL Lv  2 Term 06/30/11 [redacted]w[redacted]d 07:50 / 00:22 6 lb 9 oz (2.977 kg) F Vag-Spont EPI  Y     Comments: WNL   1 SAB              Past Surgical History  Procedure Laterality Date  . Dilation and curettage of uterus  2009   Social History   Social History  . Marital Status: Single    Spouse Name: N/A  . Number of Children: N/A  . Years of Education: N/A   Occupational History  . Not on file.   Social History Main Topics  . Smoking status: Current Every Day Smoker -- 0.25 packs/day    Types: Cigarettes    Last Attempt to Quit: 10/21/2010  . Smokeless tobacco: Never Used  . Alcohol Use: Yes  . Drug Use: Yes    Special: Marijuana  . Sexual Activity: Yes   Other Topics Concern  . Not on file   Social History Narrative   No current facility-administered medications on file prior to encounter.   Current Outpatient Prescriptions on File Prior to Encounter  Medication Sig Dispense Refill  . Multiple  Vitamin (MULTIVITAMIN WITH MINERALS) TABS tablet Take 1 tablet by mouth daily.    Marland Kitchen albuterol (PROVENTIL HFA;VENTOLIN HFA) 108 (90 BASE) MCG/ACT inhaler Inhale 2 puffs into the lungs every 4 (four) hours as needed for wheezing or shortness of breath. 1 Inhaler 0  . tetrahydrozoline-zinc (VISINE-AC) 0.05-0.25 % ophthalmic solution Place 1 drop into both eyes 3 (three) times daily as needed (dry eyes).      Allergies  Allergen Reactions  . Chocolate Hives and Itching  . Cinnamon Other (See Comments)    Turns tongue raw.  . Latex Other (See Comments)    Reports vaginal sensitivity after latex condom use.    I have reviewed the past Medical Hx, Surgical Hx, Social Hx, Allergies and Medications.   Review of Systems  Constitutional: Negative for fever and chills.  Gastrointestinal: Negative for abdominal pain.  Genitourinary: Positive for vaginal bleeding and menstrual problem. Negative for vaginal discharge, vaginal pain, pelvic pain and dyspareunia.    OBJECTIVE Patient Vitals for the past 24 hrs:  BP Temp Temp src Pulse Resp SpO2  06/04/15 2346 124/68 mmHg 98.4 F (36.9 C) Oral (!) 59 16 100 %   Constitutional: Well-developed, well-nourished female in no  acute distress.  Cardiovascular: normal rate Respiratory: normal rate and effort.  GI: Abd soft, non-tender. Neurologic: Alert and oriented x 4.  GU: declined  LAB RESULTS Results for orders placed or performed during the hospital encounter of 06/04/15 (from the past 24 hour(s))  Urinalysis, Routine w reflex microscopic (not at [facility])     Status: None   Collection Time: 06/04/15 11:46 PM  Result Value Ref Range   Color, Urine YELLOW YELLOW   APPearance CLEAR CLEAR   Specific Gravity, Urine 1.010 1.005 - 1.030   pH 7.0 5.0 - 8.0   Glucose, UA NEGATIVE NEGATIVE mg/dL   Hgb urine dipstick NEGATIVE NEGATIVE   Bilirubin Urine NEGATIVE NEGATIVE   Ketones, ur NEGATIVE NEGATIVE mg/dL   Protein, ur NEGATIVE NEGATIVE mg/dL   Nitrite  NEGATIVE NEGATIVE   Leukocytes, UA NEGATIVE NEGATIVE  Pregnancy, urine POC     Status: None   Collection Time: 06/05/15 12:03 AM  Result Value Ref Range   Preg Test, Ur NEGATIVE NEGATIVE    IMAGING No results found.  MAU COURSE UPT, UA  Discussed Hx, labs, exam w/ Dr. Ellyn Hack. No new orders.   MDM 25 year-old female w/ abnormally short and light menstrual period x one month w/ regular cycles. Likely hormonal menstrual irregularity. Neg UPT x 2.   ASSESSMENT 1. Abnormal uterine bleeding     PLAN Discharge home in stable condition per consult w/ Dr. Ellyn Hack. If next period is late, take UPT. Encouraged pt to F/U w/ [facility] for non-emergent menstrual problems  Follow-up Information    Follow up with [facility].   Why:  As needed if symptoms worsen   Contact information:   [address]  [address] [place] [address] [phone]       Follow up with [facility].   Why:  As needed in emergencies   Contact information:   [address] [id number] mc [place] [address] [phone]       Medication List    TAKE these medications        albuterol 108 (90 Base) MCG/ACT inhaler  Commonly known as:  PROVENTIL HFA;VENTOLIN HFA  Inhale 2 puffs into the lungs every 4 (four) hours as needed for wheezing or shortness of breath.     multivitamin with minerals Tabs tablet  Take 1 tablet by mouth daily.     tetrahydrozoline-zinc 0.05-0.25 % ophthalmic solution  Commonly known as:  VISINE-AC  Place 1 drop into both eyes 3 (three) times daily as needed (dry eyes).       [place], CNM 06/05/2015  1:59 AM

## 2015-07-07 DIAGNOSIS — Z411 Encounter for cosmetic surgery: Secondary | ICD-10-CM | POA: Insufficient documentation

## 2015-10-24 ENCOUNTER — Emergency Department (HOSPITAL_COMMUNITY)
Admission: EM | Admit: 2015-10-24 | Discharge: 2015-10-24 | Disposition: A | Discharge status: Home / Self Care | Payer: PRIVATE HEALTH INSURANCE | Attending: Emergency Medicine | Admitting: Emergency Medicine

## 2015-10-24 ENCOUNTER — Encounter (HOSPITAL_COMMUNITY): Payer: Self-pay | Admitting: *Deleted

## 2015-10-24 DIAGNOSIS — R42 Dizziness and giddiness: Secondary | ICD-10-CM | POA: Insufficient documentation

## 2015-10-24 DIAGNOSIS — R110 Nausea: Secondary | ICD-10-CM | POA: Insufficient documentation

## 2015-10-24 DIAGNOSIS — R55 Syncope and collapse: Secondary | ICD-10-CM | POA: Insufficient documentation

## 2015-10-24 DIAGNOSIS — Z5321 Procedure and treatment not carried out due to patient leaving prior to being seen by health care provider: Secondary | ICD-10-CM | POA: Insufficient documentation

## 2015-10-24 DIAGNOSIS — F17210 Nicotine dependence, cigarettes, uncomplicated: Secondary | ICD-10-CM | POA: Insufficient documentation

## 2015-10-24 LAB — BASIC METABOLIC PANEL
ANION GAP: 8 (ref 5–15)
BUN: 8 mg/dL (ref 6–20)
CO2: 25 mmol/L (ref 22–32)
Calcium: 9.6 mg/dL (ref 8.9–10.3)
Chloride: 103 mmol/L (ref 101–111)
Creatinine, Ser: 0.72 mg/dL (ref 0.44–1.00)
GFR calc Af Amer: >60 mL/min (ref >60)
GLUCOSE: 117 mg/dL — AB (ref 65–99)
Potassium: 4 mmol/L (ref 3.5–5.1)
SODIUM: 136 mmol/L (ref 135–145)

## 2015-10-24 LAB — CBC
HCT: 39.9 % (ref 36.0–46.0)
HEMOGLOBIN: 13.2 g/dL (ref 12.0–15.0)
MCH: 27.8 pg (ref 26.0–34.0)
MCHC: 33.1 g/dL (ref 30.0–36.0)
MCV: 84 fL (ref 78.0–100.0)
Platelets: 312 10*3/uL (ref 150–400)
RBC: 4.75 MIL/uL (ref 3.87–5.11)
RDW: 14.2 % (ref 11.5–15.5)
WBC: 8.8 10*3/uL (ref 4.0–10.5)

## 2015-10-24 LAB — URINALYSIS, ROUTINE W REFLEX MICROSCOPIC
BILIRUBIN URINE: NEGATIVE
Glucose, UA: NEGATIVE mg/dL
Hgb urine dipstick: NEGATIVE
KETONES UR: NEGATIVE mg/dL
Leukocytes, UA: NEGATIVE
NITRITE: NEGATIVE
Protein, ur: NEGATIVE mg/dL
SPECIFIC GRAVITY, URINE: 1.012 (ref 1.005–1.030)
pH: 7.5 (ref 5.0–8.0)

## 2015-10-24 LAB — CBG MONITORING, ED: Glucose-Capillary: 126 mg/dL — ABNORMAL HIGH (ref 65–99)

## 2015-10-24 LAB — POC URINE PREG, ED: PREG TEST UR: NEGATIVE

## 2015-10-24 MED ORDER — ONDANSETRON 4 MG PO TBDP
ORAL_TABLET | ORAL | Status: AC
  Filled 2015-10-24: qty 1

## 2015-10-24 MED ORDER — ONDANSETRON 4 MG PO TBDP
4.0000 mg | ORAL_TABLET | Freq: Once | ORAL | Status: AC
  Administered 2015-10-24: 4 mg via ORAL

## 2015-10-24 NOTE — ED Notes (Signed)
Pt said she needs to leave and that she cant stay and wait

## 2015-10-24 NOTE — ED Notes (Signed)
Pt states she was at work today and experienced sudden onset dizziness, nausea and felt like she was going to pass out.

## 2015-12-04 IMAGING — DX DG CHEST 2V
2 series · 2 of 2 positions shown · non-contrast
Comparison: 07/08/2013

CLINICAL DATA: Shortness of breath for 2 days, wheezing

EXAM:
CHEST - 2 VIEW

[chest pa]
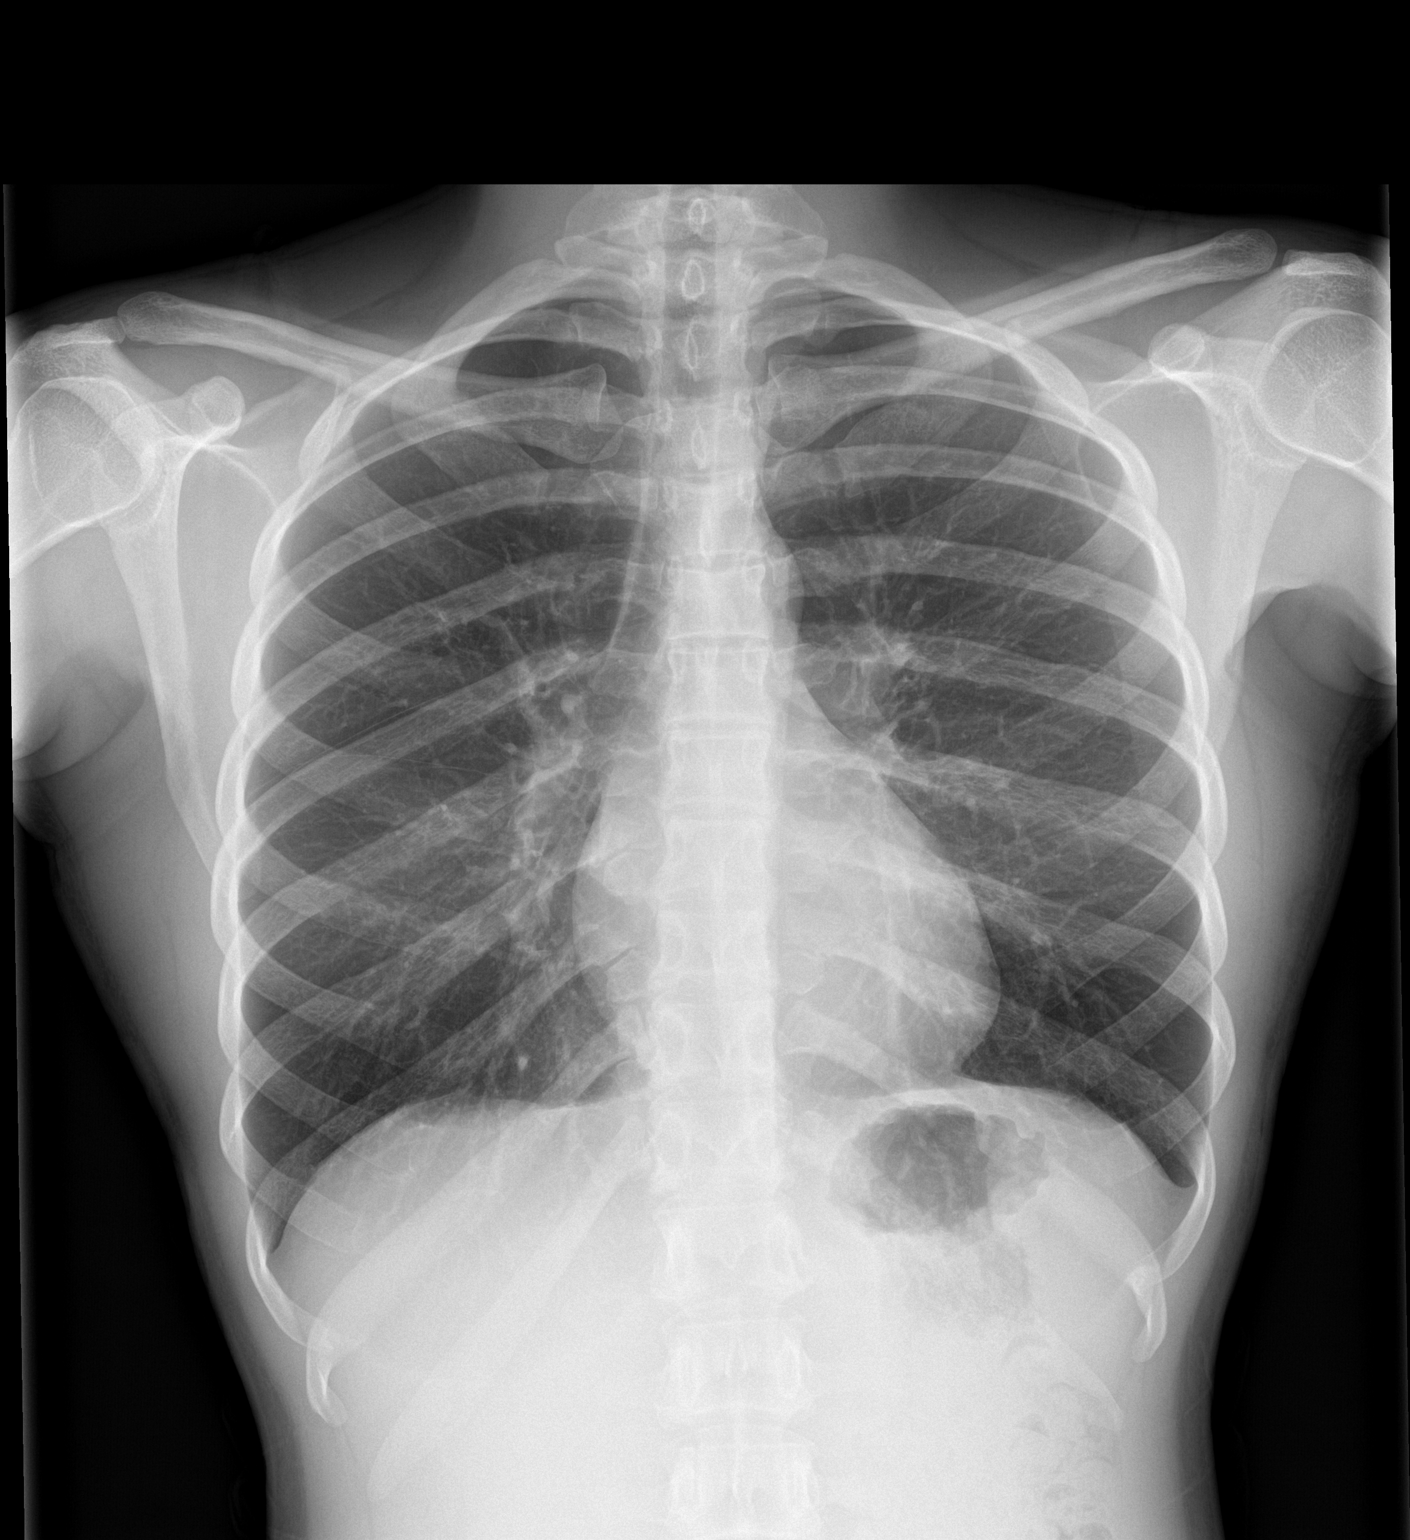

[chest lat]
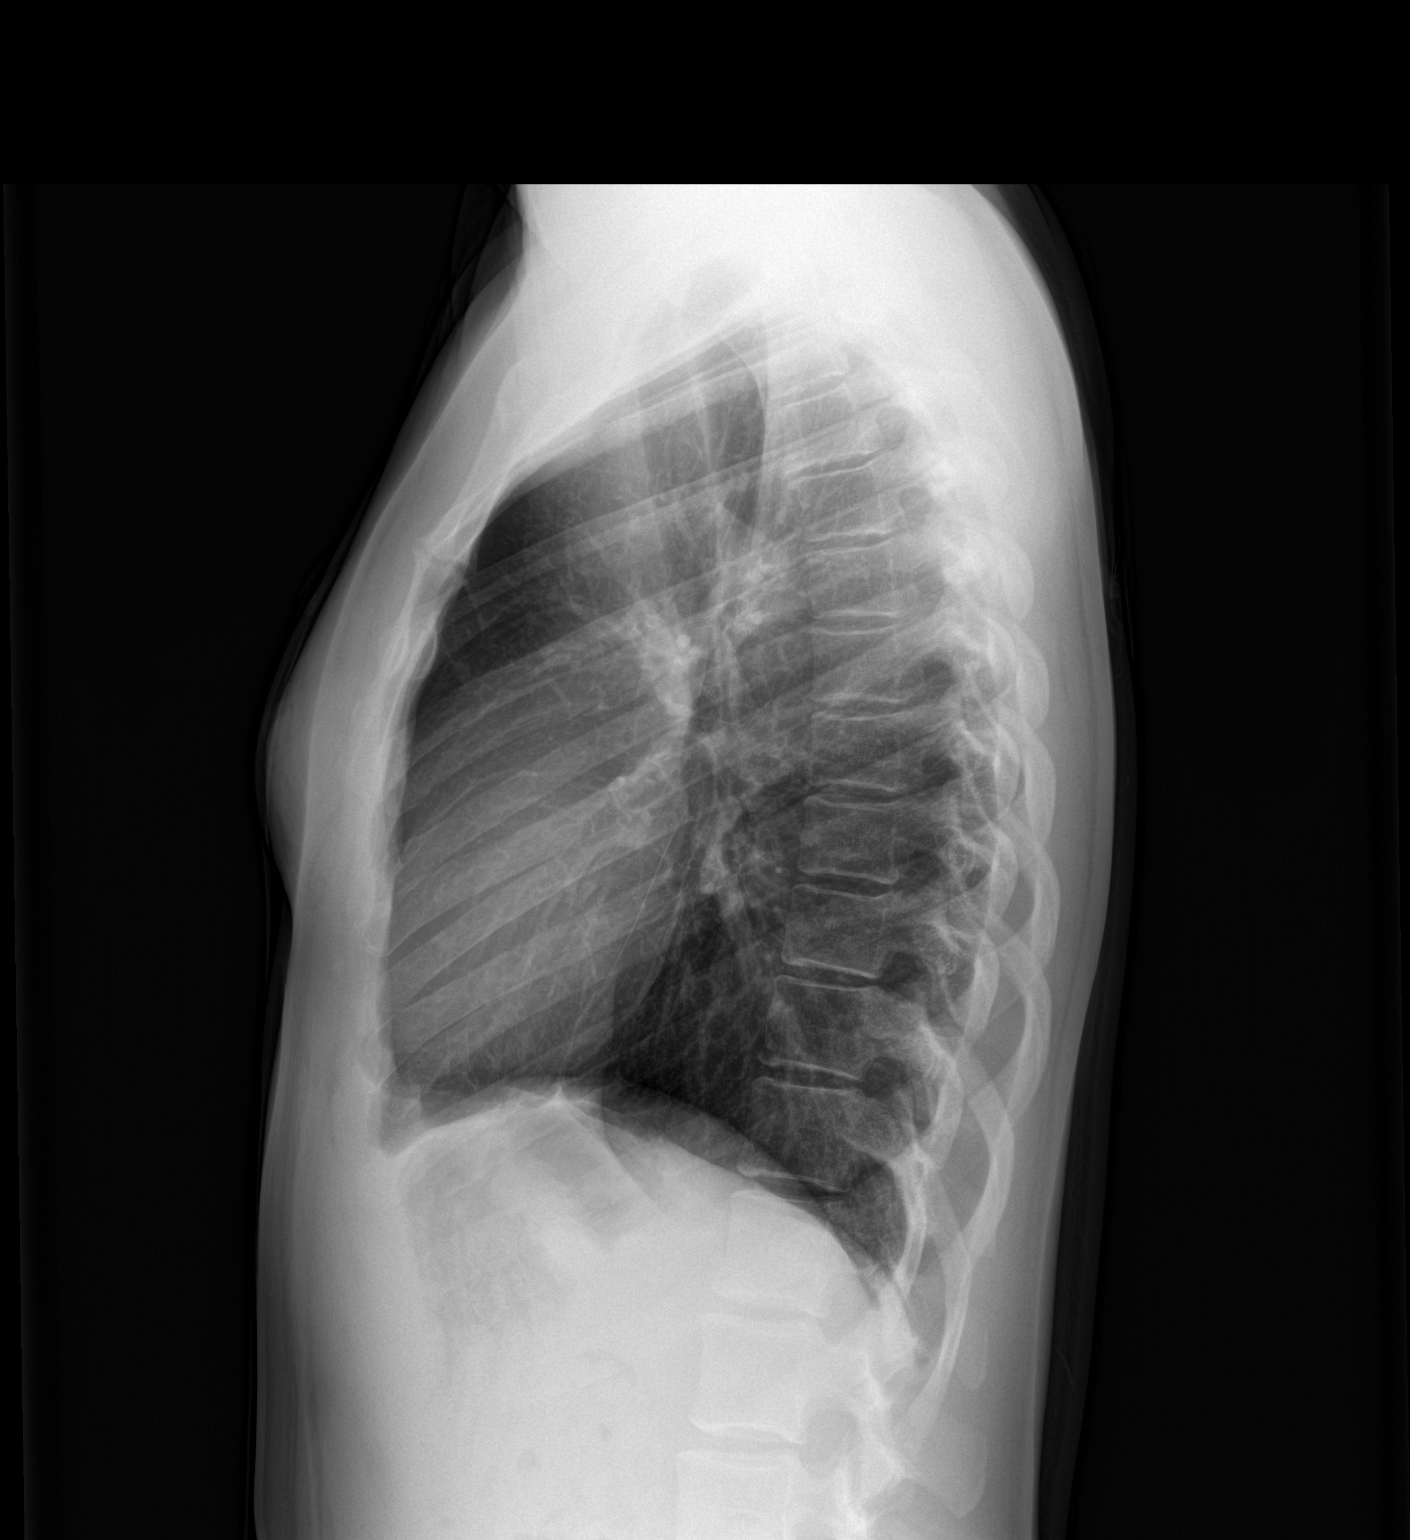

[2 of 2 positions shown; findings below may reference images not displayed]

FINDINGS: The heart size and mediastinal contours are within normal limits.
Both lungs are clear. The visualized skeletal structures are
unremarkable.
IMPRESSION: No active disease.

## 2016-01-11 ENCOUNTER — Inpatient Hospital Stay (HOSPITAL_COMMUNITY)
Admission: AD | Admit: 2016-01-11 | Discharge: 2016-01-11 | Disposition: A | Discharge status: Home / Self Care | Payer: 59 | Source: Ambulatory Visit | Attending: Obstetrics & Gynecology | Admitting: Obstetrics & Gynecology

## 2016-01-11 ENCOUNTER — Encounter (HOSPITAL_COMMUNITY): Payer: Self-pay

## 2016-01-11 DIAGNOSIS — R1084 Generalized abdominal pain: Secondary | ICD-10-CM | POA: Diagnosis not present

## 2016-01-11 DIAGNOSIS — F17210 Nicotine dependence, cigarettes, uncomplicated: Secondary | ICD-10-CM | POA: Insufficient documentation

## 2016-01-11 DIAGNOSIS — B9689 Other specified bacterial agents as the cause of diseases classified elsewhere: Secondary | ICD-10-CM | POA: Diagnosis present

## 2016-01-11 DIAGNOSIS — R109 Unspecified abdominal pain: Secondary | ICD-10-CM | POA: Diagnosis present

## 2016-01-11 DIAGNOSIS — N760 Acute vaginitis: Secondary | ICD-10-CM | POA: Diagnosis not present

## 2016-01-11 LAB — CBC WITH DIFFERENTIAL/PLATELET
BASOS ABS: 0 10*3/uL (ref 0.0–0.1)
Basophils Relative: 0 %
EOS ABS: 0.2 10*3/uL (ref 0.0–0.7)
EOS PCT: 3 %
HCT: 33.8 % — ABNORMAL LOW (ref 36.0–46.0)
Hemoglobin: 11.9 g/dL — ABNORMAL LOW (ref 12.0–15.0)
Lymphocytes Relative: 39 %
Lymphs Abs: 2.8 10*3/uL (ref 0.7–4.0)
MCH: 28 pg (ref 26.0–34.0)
MCHC: 35.2 g/dL (ref 30.0–36.0)
MCV: 79.5 fL (ref 78.0–100.0)
MONO ABS: 0.5 10*3/uL (ref 0.1–1.0)
Monocytes Relative: 7 %
Neutro Abs: 3.6 10*3/uL (ref 1.7–7.7)
Neutrophils Relative %: 51 %
PLATELETS: 251 10*3/uL (ref 150–400)
RBC: 4.25 MIL/uL (ref 3.87–5.11)
RDW: 14.7 % (ref 11.5–15.5)
WBC: 7.2 10*3/uL (ref 4.0–10.5)

## 2016-01-11 LAB — WET PREP, GENITAL
SPERM: NONE SEEN
TRICH WET PREP: NONE SEEN
YEAST WET PREP: NONE SEEN

## 2016-01-11 LAB — URINALYSIS, ROUTINE W REFLEX MICROSCOPIC
Bilirubin Urine: NEGATIVE
GLUCOSE, UA: NEGATIVE mg/dL
Hgb urine dipstick: NEGATIVE
Ketones, ur: NEGATIVE mg/dL
LEUKOCYTES UA: NEGATIVE
NITRITE: NEGATIVE
PH: 6.5 (ref 5.0–8.0)
Protein, ur: NEGATIVE mg/dL
SPECIFIC GRAVITY, URINE: 1.01 (ref 1.005–1.030)

## 2016-01-11 LAB — COMPREHENSIVE METABOLIC PANEL
ALT: 23 U/L (ref 14–54)
AST: 25 U/L (ref 15–41)
Albumin: 4.2 g/dL (ref 3.5–5.0)
Alkaline Phosphatase: 41 U/L (ref 38–126)
Anion gap: 5 (ref 5–15)
BUN: 9 mg/dL (ref 6–20)
CHLORIDE: 106 mmol/L (ref 101–111)
CO2: 27 mmol/L (ref 22–32)
Calcium: 9.5 mg/dL (ref 8.9–10.3)
Creatinine, Ser: 0.69 mg/dL (ref 0.44–1.00)
GFR calc Af Amer: >60 mL/min (ref >60)
Glucose, Bld: 93 mg/dL (ref 65–99)
POTASSIUM: 4.1 mmol/L (ref 3.5–5.1)
SODIUM: 138 mmol/L (ref 135–145)
Total Bilirubin: 0.4 mg/dL (ref 0.3–1.2)
Total Protein: 7.1 g/dL (ref 6.5–8.1)

## 2016-01-11 LAB — POCT PREGNANCY, URINE: Preg Test, Ur: NEGATIVE

## 2016-01-11 MED ORDER — SIMETHICONE 80 MG PO CHEW
80.0000 mg | CHEWABLE_TABLET | Freq: Once | ORAL | Status: AC
  Administered 2016-01-11: 80 mg via ORAL
  Filled 2016-01-11: qty 1

## 2016-01-11 MED ORDER — METRONIDAZOLE 500 MG PO TABS: 500.0000 mg | ORAL_TABLET | Freq: Two times a day (BID) | ORAL | 0 refills | Status: AC

## 2016-01-11 MED ORDER — SIMETHICONE 80 MG PO CHEW
80.0000 mg | CHEWABLE_TABLET | Freq: Four times a day (QID) | ORAL | 0 refills | Status: DC | PRN
Start: 2016-01-11 — End: 2018-03-29

## 2016-01-11 NOTE — Discharge Instructions (Signed)
Abdominal Pain, Adult °Many things can cause abdominal pain. Usually, abdominal pain is not caused by a disease and will improve without treatment. It can often be observed and treated at home. Your health care provider will do a physical exam and possibly order blood tests and X-rays to help determine the seriousness of your pain. However, in many cases, more time must pass before a clear cause of the pain can be found. Before that point, your health care provider may not know if you need more testing or further treatment. °HOME CARE INSTRUCTIONS °Monitor your abdominal pain for any changes. The following actions may help to alleviate any discomfort you are experiencing: °· Only take over-the-counter or prescription medicines as directed by your health care provider. °· Do not take laxatives unless directed to do so by your health care provider. °· Try a clear liquid diet (broth, tea, or water) as directed by your health care provider. Slowly move to a bland diet as tolerated. °SEEK MEDICAL CARE IF: °· You have unexplained abdominal pain. °· You have abdominal pain associated with nausea or diarrhea. °· You have pain when you urinate or have a bowel movement. °· You experience abdominal pain that wakes you in the night. °· You have abdominal pain that is worsened or improved by eating food. °· You have abdominal pain that is worsened with eating fatty foods. °· You have a fever. °SEEK IMMEDIATE MEDICAL CARE IF: °· Your pain does not go away within 2 hours. °· You keep throwing up (vomiting). °· Your pain is felt only in portions of the abdomen, such as the right side or the left lower portion of the abdomen. °· You pass bloody or black tarry stools. °MAKE SURE YOU: °· Understand these instructions. °· Will watch your condition. °· Will get help right away if you are not doing well or get worse. °  °This information is not intended to replace advice given to you by your health care provider. Make sure you discuss  any questions you have with your health care provider. °  °Document Released: 01/20/2005 Document Revised: 01/01/2015 Document Reviewed: 12/20/2012 °Elsevier Interactive Patient Education ©2016 Elsevier Inc. ° °Bacterial Vaginosis °Bacterial vaginosis is a vaginal infection that occurs when the normal balance of bacteria in the vagina is disrupted. It results from an overgrowth of certain bacteria. This is the most common vaginal infection in women of childbearing age. Treatment is important to prevent complications, especially in pregnant women, as it can cause a premature delivery. °CAUSES  °Bacterial vaginosis is caused by an increase in harmful bacteria that are normally present in smaller amounts in the vagina. Several different kinds of bacteria can cause bacterial vaginosis. However, the reason that the condition develops is not fully understood. °RISK FACTORS °Certain activities or behaviors can put you at an increased risk of developing bacterial vaginosis, including: °· Having a new sex partner or multiple sex partners. °· Douching. °· Using an intrauterine device (IUD) for contraception. °Women do not get bacterial vaginosis from toilet seats, bedding, swimming pools, or contact with objects around them. °SIGNS AND SYMPTOMS  °Some women with bacterial vaginosis have no signs or symptoms. Common symptoms include: °· Grey vaginal discharge. °· A fishlike odor with discharge, especially after sexual intercourse. °· Itching or burning of the vagina and vulva. °· Burning or pain with urination. °DIAGNOSIS  °Your health care provider will take a medical history and examine the vagina for signs of bacterial vaginosis. A sample of vaginal fluid may   be taken. Your health care provider will look at this sample under a microscope to check for bacteria and abnormal cells. A vaginal pH test may also be done.  °TREATMENT  °Bacterial vaginosis may be treated with antibiotic medicines. These may be given in the form of a  pill or a vaginal cream. A second round of antibiotics may be prescribed if the condition comes back after treatment. Because bacterial vaginosis increases your risk for sexually transmitted diseases, getting treated can help reduce your risk for chlamydia, gonorrhea, HIV, and herpes. °HOME CARE INSTRUCTIONS  °· Only take over-the-counter or prescription medicines as directed by your health care provider. °· If antibiotic medicine was prescribed, take it as directed. Make sure you finish it even if you start to feel better. °· Tell all sexual partners that you have a vaginal infection. They should see their health care provider and be treated if they have problems, such as a mild rash or itching. °· During treatment, it is important that you follow these instructions: °¨ Avoid sexual activity or use condoms correctly. °¨ Do not douche. °¨ Avoid alcohol as directed by your health care provider. °¨ Avoid breastfeeding as directed by your health care provider. °SEEK MEDICAL CARE IF:  °· Your symptoms are not improving after 3 days of treatment. °· You have increased discharge or pain. °· You have a fever. °MAKE SURE YOU:  °· Understand these instructions. °· Will watch your condition. °· Will get help right away if you are not doing well or get worse. °FOR MORE INFORMATION  °Centers for Disease Control and Prevention, Division of STD Prevention: www.cdc.gov/std °American Sexual Health Association (ASHA): www.ashastd.org  °  °This information is not intended to replace advice given to you by your health care provider. Make sure you discuss any questions you have with your health care provider. °  °Document Released: 04/12/2005 Document Revised: 05/03/2014 Document Reviewed: 11/22/2012 °Elsevier Interactive Patient Education ©2016 Elsevier Inc. ° ° °

## 2016-01-11 NOTE — MAU Provider Note (Signed)
Chief Complaint:  Abdominal Pain and Morning Sickness   First Provider Initiated Contact with Patient 01/11/16 0908      HPI: Kristin Kemp is a 25 y.o. G2P1011 who presents to maternity admissions reporting generalized abdominal pain for a week.  Was not sure if she is pregnant. Concerned about STDs.  Recently had GC (treated)  Has had nausea but no vomiting.  No diarrhea but "constipation" which she defines as "having to push it out".  But has multiple stools per day, while her normal is one per day.. She reports no vaginal bleeding, vaginal itching/burning, urinary symptoms, h/a, dizziness, or fever/chills.    Abdominal Pain  This is a new problem. The current episode started in the past 7 days. The onset quality is gradual. The problem occurs intermittently. The problem has been waxing and waning. The pain is located in the epigastric region and generalized abdominal region. The pain is mild. The quality of the pain is colicky and cramping. The abdominal pain does not radiate. Associated symptoms include diarrhea (Not frank diarrhea, but multiple stools per day, usually only one per day) and nausea. Pertinent negatives include no constipation, dysuria, fever, frequency, headaches, myalgias or vomiting. The pain is aggravated by palpation. The pain is relieved by belching. She has tried nothing for the symptoms.   RN Note: Patient presents with generalized abdominal pain and nausea for one week.  LMP 12/25/15 Past Medical History: Past Medical History:  Diagnosis Date  . Abnormal Pap smear   . Anemia   . Herpes   . Late prenatal care   . Ovarian cyst   . PID (pelvic inflammatory disease)   . Urinary tract infection     Past obstetric history: OB History  Gravida Para Term Preterm AB Living  2 1 1   1 1   SAB TAB Ectopic Multiple Live Births  1     0 1    # Outcome Date GA Lbr Len/2nd Weight Sex Delivery Anes PTL Lv  2 Term 06/30/11 [redacted]w[redacted]d 07:50 / 00:22 6 lb 9 oz (2.977 kg) F  Vag-Spont EPI  LIV     Birth Comments: WNL   1 SAB               Past Surgical History: Past Surgical History:  Procedure Laterality Date  . DILATION AND CURETTAGE OF UTERUS  2009    Family History: Family History  Problem Relation Age of Onset  . Hypertension Maternal Grandmother   . Hyperlipidemia Maternal Grandmother   . Heart disease Maternal Grandmother     enlarged heart  . Diabetes Maternal Uncle   . Hypertension Maternal Uncle   . Kidney disease Maternal Uncle   . Cancer Paternal Aunt     lung  . Cancer Paternal Uncle     lung  . Diabetes Maternal Grandfather   . Anesthesia problems Neg Hx     Social History: Social History  Substance Use Topics  . Smoking status: Current Every Day Smoker    Packs/day: 0.25    Types: Cigarettes    Last attempt to quit: 10/21/2010  . Smokeless tobacco: Never Used  . Alcohol use Yes    Allergies:  Allergies  Allergen Reactions  . Chocolate Hives and Itching  . Cinnamon Other (See Comments)    Turns tongue raw.  . Latex Other (See Comments)    Reports vaginal sensitivity after latex condom use.    Meds:  Prescriptions Prior to Admission  Medication Sig Dispense  Refill Last Dose  . Multiple Vitamin (MULTIVITAMIN WITH MINERALS) TABS tablet Take 1 tablet by mouth daily.   01/10/2016 at Unknown time  . tetrahydrozoline-zinc (VISINE-AC) 0.05-0.25 % ophthalmic solution Place 1 drop into both eyes 3 (three) times daily as needed (dry eyes).    Past Month at Unknown time    I have reviewed patient's Past Medical Hx, Surgical Hx, Family Hx, Social Hx, medications and allergies.  ROS:  Review of Systems  Constitutional: Negative for fever.  Gastrointestinal: Positive for abdominal pain, diarrhea (Not frank diarrhea, but multiple stools per day, usually only one per day) and nausea. Negative for constipation and vomiting.  Genitourinary: Negative for dysuria and frequency.  Musculoskeletal: Negative for myalgias.   Neurological: Negative for headaches.   Other systems negative     Physical Exam  Patient Vitals for the past 24 hrs:  BP Temp Temp src Pulse Resp Height Weight  01/11/16 0844 118/82 97.8 F (36.6 C) - 80 16 - -  01/11/16 0843 - 97.9 F (36.6 C) Oral - 16 - -  01/11/16 0840 - - - - - 5\' 3"  (1.6 m) 118 lb (53.5 kg)   Constitutional: Well-developed, well-nourished female in no acute distress.  Cardiovascular: normal rate and rhythm, no ectopy audible, S1 & S2 heard, no murmur Respiratory: normal effort, no distress. Lungs CTAB with no wheezes or crackles GI: Abd soft, mildly tender in middle of abdomen.  When pressing suprapubic, states pain goes up to epigastrum.  No focal tenderness.   Nondistended.  No rebound, No guarding.  Bowel Sounds audible  MS: Extremities nontender, no edema, normal ROM Neurologic: Alert and oriented x 4.   Grossly nonfocal. GU: Neg CVAT. Skin:  Warm and Dry Psych:  Affect appropriate.  PELVIC EXAM: Cervix pink, visually closed, without lesion, scant white creamy discharge, vaginal walls and external genitalia normal Bimanual exam: Cervix firm, anterior, neg CMT, uterus nontender, nonenlarged, adnexa without tenderness, enlargement, or mass    Labs: Results for orders placed or performed during the hospital encounter of 01/11/16 (from the past 24 hour(s))  Urinalysis, Routine w reflex microscopic ([facility])     Status: None   Collection Time: 01/11/16  8:35 AM  Result Value Ref Range   Color, Urine YELLOW YELLOW   APPearance CLEAR CLEAR   Specific Gravity, Urine 1.010 1.005 - 1.030   pH 6.5 5.0 - 8.0   Glucose, UA NEGATIVE NEGATIVE mg/dL   Hgb urine dipstick NEGATIVE NEGATIVE   Bilirubin Urine NEGATIVE NEGATIVE   Ketones, ur NEGATIVE NEGATIVE mg/dL   Protein, ur NEGATIVE NEGATIVE mg/dL   Nitrite NEGATIVE NEGATIVE   Leukocytes, UA NEGATIVE NEGATIVE  Pregnancy, urine POC     Status: None   Collection Time: 01/11/16  8:45 AM  Result Value Ref  Range   Preg Test, Ur NEGATIVE NEGATIVE  Wet prep, genital     Status: Abnormal   Collection Time: 01/11/16  9:20 AM  Result Value Ref Range   Yeast Wet Prep HPF POC NONE SEEN NONE SEEN   Trich, Wet Prep NONE SEEN NONE SEEN   Clue Cells Wet Prep HPF POC PRESENT (A) NONE SEEN   WBC, Wet Prep HPF POC MODERATE (A) NONE SEEN   Sperm NONE SEEN   CBC with Differential/Platelet     Status: Abnormal   Collection Time: 01/11/16  9:30 AM  Result Value Ref Range   WBC 7.2 4.0 - 10.5 K/uL   RBC 4.25 3.87 - 5.11 MIL/uL  Hemoglobin 11.9 (L) 12.0 - 15.0 g/dL   HCT 16.1 (L) 09.6 - 04.5 %   MCV 79.5 78.0 - 100.0 fL   MCH 28.0 26.0 - 34.0 pg   MCHC 35.2 30.0 - 36.0 g/dL   RDW 40.9 81.1 - 91.4 %   Platelets 251 150 - 400 K/uL   Neutrophils Relative % 51 %   Neutro Abs 3.6 1.7 - 7.7 K/uL   Lymphocytes Relative 39 %   Lymphs Abs 2.8 0.7 - 4.0 K/uL   Monocytes Relative 7 %   Monocytes Absolute 0.5 0.1 - 1.0 K/uL   Eosinophils Relative 3 %   Eosinophils Absolute 0.2 0.0 - 0.7 K/uL   Basophils Relative 0 %   Basophils Absolute 0.0 0.0 - 0.1 K/uL  Comprehensive metabolic panel     Status: None   Collection Time: 01/11/16  9:30 AM  Result Value Ref Range   Sodium 138 135 - 145 mmol/L   Potassium 4.1 3.5 - 5.1 mmol/L   Chloride 106 101 - 111 mmol/L   CO2 27 22 - 32 mmol/L   Glucose, Bld 93 65 - 99 mg/dL   BUN 9 6 - 20 mg/dL   Creatinine, Ser 7.82 0.44 - 1.00 mg/dL   Calcium 9.5 8.9 - 95.6 mg/dL   Total Protein 7.1 6.5 - 8.1 g/dL   Albumin 4.2 3.5 - 5.0 g/dL   AST 25 15 - 41 U/L   ALT 23 14 - 54 U/L   Alkaline Phosphatase 41 38 - 126 U/L   Total Bilirubin 0.4 0.3 - 1.2 mg/dL   GFR calc non Af Amer >60 >60 mL/min   GFR calc Af Amer >60 >60 mL/min   Anion gap 5 5 - 15    Imaging:  No results found.  MAU Course/MDM: I have ordered labs as follows:  UA, UPT, CBC, diff, CMET to rule out serious abdominal processes                                                     These are all negative.   I also did wet prep and cultures at her request.  Wet prep                                                     showed clue cells which are indicative of bacterial vaginosis. Imaging ordered: none Results reviewed.  Treatments in MAU included Mylicon for gas pains and belching.  States this helped some Pt stable at time of discharge.  Assessment: Abdominal pain Frequent stools, suspect gastroenteritis Bacterial vaginosis  Plan: Discharge home Recommend push fluids and rest Rx sent for Mylicon for gas pains Rx Flagyl for BV  Encouraged to return here or to other Urgent Care/ED if she develops worsening of symptoms, increase in pain, fever, or other concerning symptoms.   Wynelle Bourgeois CNM, MSN Certified Nurse-Midwife 01/11/2016 9:28 AM

## 2016-01-11 NOTE — MAU Note (Signed)
Patient presents with generalized abdominal pain and nausea for one week.  LMP 12/25/15

## 2016-01-12 LAB — GC/CHLAMYDIA PROBE AMP (~~LOC~~) NOT AT ARMC
Chlamydia: NEGATIVE
Neisseria Gonorrhea: NEGATIVE

## 2016-05-26 ENCOUNTER — Inpatient Hospital Stay (HOSPITAL_COMMUNITY)
Admission: AD | Admit: 2016-05-26 | Discharge: 2016-05-26 | Disposition: A | Discharge status: Home / Self Care | Payer: Medicaid Other | Source: Ambulatory Visit | Attending: Obstetrics and Gynecology | Admitting: Obstetrics and Gynecology

## 2016-05-26 ENCOUNTER — Encounter (HOSPITAL_COMMUNITY): Payer: Self-pay | Admitting: *Deleted

## 2016-05-26 DIAGNOSIS — K5900 Constipation, unspecified: Secondary | ICD-10-CM | POA: Diagnosis not present

## 2016-05-26 DIAGNOSIS — R109 Unspecified abdominal pain: Secondary | ICD-10-CM | POA: Diagnosis not present

## 2016-05-26 DIAGNOSIS — F17210 Nicotine dependence, cigarettes, uncomplicated: Secondary | ICD-10-CM | POA: Insufficient documentation

## 2016-05-26 DIAGNOSIS — Z3202 Encounter for pregnancy test, result negative: Secondary | ICD-10-CM | POA: Diagnosis not present

## 2016-05-26 LAB — URINALYSIS, ROUTINE W REFLEX MICROSCOPIC
Bilirubin Urine: NEGATIVE
Glucose, UA: NEGATIVE mg/dL
Hgb urine dipstick: NEGATIVE
Ketones, ur: NEGATIVE mg/dL
Leukocytes, UA: NEGATIVE
NITRITE: NEGATIVE
PH: 6 (ref 5.0–8.0)
PROTEIN: NEGATIVE mg/dL
Specific Gravity, Urine: 1.011 (ref 1.005–1.030)

## 2016-05-26 LAB — CBC
HCT: 35.7 % — ABNORMAL LOW (ref 36.0–46.0)
Hemoglobin: 12.6 g/dL (ref 12.0–15.0)
MCH: 28.6 pg (ref 26.0–34.0)
MCHC: 35.3 g/dL (ref 30.0–36.0)
MCV: 81 fL (ref 78.0–100.0)
Platelets: 288 10*3/uL (ref 150–400)
RBC: 4.41 MIL/uL (ref 3.87–5.11)
RDW: 14.8 % (ref 11.5–15.5)
WBC: 7.9 10*3/uL (ref 4.0–10.5)

## 2016-05-26 LAB — WET PREP, GENITAL
CLUE CELLS WET PREP: NONE SEEN
Sperm: NONE SEEN
Trich, Wet Prep: NONE SEEN
YEAST WET PREP: NONE SEEN

## 2016-05-26 LAB — POCT PREGNANCY, URINE: Preg Test, Ur: NEGATIVE

## 2016-05-26 MED ORDER — SIMETHICONE 80 MG PO CHEW
160.0000 mg | CHEWABLE_TABLET | Freq: Once | ORAL | Status: AC
  Administered 2016-05-26: 160 mg via ORAL
  Filled 2016-05-26: qty 2

## 2016-05-26 MED ORDER — IBUPROFEN 600 MG PO TABS
600.0000 mg | ORAL_TABLET | Freq: Once | ORAL | Status: AC
  Administered 2016-05-26: 600 mg via ORAL
  Filled 2016-05-26: qty 1

## 2016-05-26 MED ORDER — IBUPROFEN 600 MG PO TABS: 600.0000 mg | ORAL_TABLET | Freq: Four times a day (QID) | ORAL | 0 refills | Status: DC | PRN

## 2016-05-26 NOTE — MAU Note (Signed)
Cramping real bad.  Started last Sat. Thought it was related to constipation, finally had a BM, but cramping has continued.  Period is not due until next wk. Pain is more intense at night when laying down.

## 2016-05-26 NOTE — MAU Provider Note (Signed)
History     CSN: [id number]  Arrival date and time: 05/26/16 4098   First Provider Initiated Contact with Patient 05/26/16 0935      Chief Complaint  Patient presents with  . Abdominal Cramping   HPI   Ms.Kristin Kemp is a 26 y.o. female here in MAU with constipation and abdominal pain. She had not had a BM in 4 days so she drank warm apple juice and milk yesterday and she had a BM shortly after that. The BM was medium- small and hard to push out. She did not take any other form of over the counter remedy. She continues to have lower abdominal cramping. The pain comes and goes.   OB History    Gravida Para Term Preterm AB Living   2 1 1   1 1    SAB TAB Ectopic Multiple Live Births   1     0 1      Past Medical History:  Diagnosis Date  . Abnormal Pap smear   . Anemia   . Herpes   . Late prenatal care   . Ovarian cyst   . PID (pelvic inflammatory disease)   . Urinary tract infection     Past Surgical History:  Procedure Laterality Date  . DILATION AND CURETTAGE OF UTERUS  2009    Family History  Problem Relation Age of Onset  . Hypertension Maternal Grandmother   . Hyperlipidemia Maternal Grandmother   . Heart disease Maternal Grandmother     enlarged heart  . Diabetes Maternal Uncle   . Hypertension Maternal Uncle   . Kidney disease Maternal Uncle   . Cancer Paternal Aunt     lung  . Cancer Paternal Uncle     lung  . Diabetes Maternal Grandfather   . Anesthesia problems Neg Hx     Social History  Substance Use Topics  . Smoking status: Current Every Day Smoker    Packs/day: 0.50    Types: Cigarettes    Last attempt to quit: 10/21/2010  . Smokeless tobacco: Current User  . Alcohol use Yes    Allergies:  Allergies  Allergen Reactions  . Chocolate Hives and Itching  . Cinnamon Other (See Comments)    Turns tongue raw.  . Latex Other (See Comments)    Reports vaginal sensitivity after latex condom use.    Prescriptions Prior to Admission   Medication Sig Dispense Refill Last Dose  . Multiple Vitamin (MULTIVITAMIN WITH MINERALS) TABS tablet Take 1 tablet by mouth daily.   05/25/2016 at Unknown time  . simethicone (GAS-X) 80 MG chewable tablet Chew 1 tablet (80 mg total) by mouth every 6 (six) hours as needed for flatulence. (Patient not taking: Reported on 05/26/2016) 30 tablet 0 Not Taking at Unknown time   Results for orders placed or performed during the hospital encounter of 05/26/16 (from the past 48 hour(s))  Urinalysis, Routine w reflex microscopic     Status: None   Collection Time: 05/26/16  8:00 AM  Result Value Ref Range   Color, Urine YELLOW YELLOW   APPearance CLEAR CLEAR   Specific Gravity, Urine 1.011 1.005 - 1.030   pH 6.0 5.0 - 8.0   Glucose, UA NEGATIVE NEGATIVE mg/dL   Hgb urine dipstick NEGATIVE NEGATIVE   Bilirubin Urine NEGATIVE NEGATIVE   Ketones, ur NEGATIVE NEGATIVE mg/dL   Protein, ur NEGATIVE NEGATIVE mg/dL   Nitrite NEGATIVE NEGATIVE   Leukocytes, UA NEGATIVE NEGATIVE  Pregnancy, urine POC  Status: None   Collection Time: 05/26/16  8:55 AM  Result Value Ref Range   Preg Test, Ur NEGATIVE NEGATIVE    Comment:        THE SENSITIVITY OF THIS METHODOLOGY IS >24 mIU/mL   CBC     Status: Abnormal   Collection Time: 05/26/16  9:58 AM  Result Value Ref Range   WBC 7.9 4.0 - 10.5 K/uL   RBC 4.41 3.87 - 5.11 MIL/uL   Hemoglobin 12.6 12.0 - 15.0 g/dL   HCT 16.135.7 (L) 09.636.0 - 04.546.0 %   MCV 81.0 78.0 - 100.0 fL   MCH 28.6 26.0 - 34.0 pg   MCHC 35.3 30.0 - 36.0 g/dL   RDW 40.914.8 81.111.5 - 91.415.5 %   Platelets 288 150 - 400 K/uL  Wet prep, genital     Status: Abnormal   Collection Time: 05/26/16 10:05 AM  Result Value Ref Range   Yeast Wet Prep HPF POC NONE SEEN NONE SEEN   Trich, Wet Prep NONE SEEN NONE SEEN   Clue Cells Wet Prep HPF POC NONE SEEN NONE SEEN   WBC, Wet Prep HPF POC FEW (A) NONE SEEN    Comment: FEW BACTERIA SEEN   Sperm NONE SEEN    Review of Systems  Constitutional: Negative for  fever.  Gastrointestinal: Positive for constipation and nausea. Negative for vomiting.  Genitourinary: Negative for dysuria and vaginal bleeding.   Physical Exam   Blood pressure 119/74, pulse 91, temperature 98.2 F (36.8 C), temperature source Oral, resp. rate 16, height 5\' 3"  (1.6 m), weight 117 lb (53.1 kg), last menstrual period 05/04/2016.  Physical Exam  Constitutional: She is oriented to person, place, and time. She appears well-developed and well-nourished. No distress.  HENT:  Head: Normocephalic.  Eyes: Pupils are equal, round, and reactive to light.  GI: Soft. She exhibits no distension and no mass. There is no tenderness. There is no rebound and no guarding.  Musculoskeletal: Normal range of motion.  Neurological: She is alert and oriented to person, place, and time.  Skin: Skin is warm. She is not diaphoretic.  Psychiatric: Her behavior is normal.    MAU Course  Procedures  None  MDM  Simethicone tabs X 2 Ibuprofen 600 mg PO X 1  Pain down from 6/10 to 1/10; patient said meds helped a lot . Discussed patient with Dr. Ellyn HackBovard after discharge.   Assessment and Plan   A:  1. Constipation, unspecified constipation type   2. Abdominal pain in female     P:  Discharge home in stable condition Continue simethicone at home.  Discussed meds for use at home for constipation.  Return to MAU if symptoms worsen, for emergencies    Duane LopeJennifer I Lauryl Seyer, NP 05/26/2016 3:28 PM

## 2016-05-26 NOTE — MAU Note (Signed)
C/o constipation and abdominal pain for past week; had a BM this Am with difficulty; UPT was negative today;

## 2016-05-26 NOTE — Discharge Instructions (Signed)
Constipation, Adult °Constipation is when a person: °· Poops (has a bowel movement) fewer times in a week than normal. °· Has a hard time pooping. °· Has poop that is dry, hard, or bigger than normal. ° °Follow these instructions at home: °Eating and drinking ° °· Eat foods that have a lot of fiber, such as: °? Fresh fruits and vegetables. °? Whole grains. °? Beans. °· Eat less of foods that are high in fat, low in fiber, or overly processed, such as: °? French fries. °? Hamburgers. °? Cookies. °? Candy. °? Soda. °· Drink enough fluid to keep your pee (urine) clear or pale yellow. °General instructions °· Exercise regularly or as told by your doctor. °· Go to the restroom when you feel like you need to poop. Do not hold it in. °· Take over-the-counter and prescription medicines only as told by your doctor. These include any fiber supplements. °· Do pelvic floor retraining exercises, such as: °? Doing deep breathing while relaxing your lower belly (abdomen). °? Relaxing your pelvic floor while pooping. °· Watch your condition for any changes. °· Keep all follow-up visits as told by your doctor. This is important. °Contact a doctor if: °· You have pain that gets worse. °· You have a fever. °· You have not pooped for 4 days. °· You throw up (vomit). °· You are not hungry. °· You lose weight. °· You are bleeding from the anus. °· You have thin, pencil-like poop (stool). °Get help right away if: °· You have a fever, and your symptoms suddenly get worse. °· You leak poop or have blood in your poop. °· Your belly feels hard or bigger than normal (is bloated). °· You have very bad belly pain. °· You feel dizzy or you faint. °This information is not intended to replace advice given to you by your health care provider. Make sure you discuss any questions you have with your health care provider. °Document Released: 09/29/2007 Document Revised: 10/31/2015 Document Reviewed: 10/01/2015 °Elsevier Interactive Patient Education ©  2017 [employer] ° °

## 2016-05-27 LAB — GC/CHLAMYDIA PROBE AMP (~~LOC~~) NOT AT ARMC
Chlamydia: NEGATIVE
NEISSERIA GONORRHEA: NEGATIVE

## 2016-10-28 ENCOUNTER — Inpatient Hospital Stay (HOSPITAL_COMMUNITY)
Admission: AD | Admit: 2016-10-28 | Discharge: 2016-10-29 | Disposition: A | Discharge status: Home / Self Care | Payer: Medicaid Other | Source: Ambulatory Visit | Attending: Obstetrics and Gynecology | Admitting: Obstetrics and Gynecology

## 2016-10-28 ENCOUNTER — Encounter (HOSPITAL_COMMUNITY): Payer: Self-pay | Admitting: *Deleted

## 2016-10-28 DIAGNOSIS — N83201 Unspecified ovarian cyst, right side: Secondary | ICD-10-CM | POA: Diagnosis not present

## 2016-10-28 DIAGNOSIS — N83202 Unspecified ovarian cyst, left side: Secondary | ICD-10-CM | POA: Insufficient documentation

## 2016-10-28 DIAGNOSIS — N760 Acute vaginitis: Secondary | ICD-10-CM | POA: Diagnosis not present

## 2016-10-28 DIAGNOSIS — F17210 Nicotine dependence, cigarettes, uncomplicated: Secondary | ICD-10-CM | POA: Diagnosis not present

## 2016-10-28 DIAGNOSIS — Z3202 Encounter for pregnancy test, result negative: Secondary | ICD-10-CM | POA: Insufficient documentation

## 2016-10-28 DIAGNOSIS — B9689 Other specified bacterial agents as the cause of diseases classified elsewhere: Secondary | ICD-10-CM | POA: Insufficient documentation

## 2016-10-28 DIAGNOSIS — R102 Pelvic and perineal pain: Secondary | ICD-10-CM

## 2016-10-28 DIAGNOSIS — R109 Unspecified abdominal pain: Secondary | ICD-10-CM | POA: Diagnosis present

## 2016-10-28 LAB — POCT PREGNANCY, URINE: Preg Test, Ur: NEGATIVE

## 2016-10-28 LAB — URINALYSIS, ROUTINE W REFLEX MICROSCOPIC
Bilirubin Urine: NEGATIVE
GLUCOSE, UA: NEGATIVE mg/dL
HGB URINE DIPSTICK: NEGATIVE
Ketones, ur: NEGATIVE mg/dL
Leukocytes, UA: NEGATIVE
Nitrite: NEGATIVE
Protein, ur: NEGATIVE mg/dL
Specific Gravity, Urine: 1.012 (ref 1.005–1.030)
pH: 6 (ref 5.0–8.0)

## 2016-10-28 NOTE — MAU Note (Addendum)
PT SAYS  SHE  HAS LOWER ABD PAIN - STARTED ON 6-26.    NO MEDS    FOR PAIN   DID HPT  ON Tuesday AND  TODAY - BOTH NEG .   NO BIRTH CONTROL.  LAST SEX-   YESTERDAY . NO VAG D/C.        HAS H/A- STARTED 6-28-  COMES/ GOES - TOOK EXCEDRIN ON SAT - SLIGHT RELIEF

## 2016-10-29 ENCOUNTER — Inpatient Hospital Stay (HOSPITAL_COMMUNITY): Payer: Medicaid Other

## 2016-10-29 ENCOUNTER — Encounter (HOSPITAL_COMMUNITY): Payer: Self-pay

## 2016-10-29 DIAGNOSIS — N83202 Unspecified ovarian cyst, left side: Secondary | ICD-10-CM

## 2016-10-29 DIAGNOSIS — N83201 Unspecified ovarian cyst, right side: Secondary | ICD-10-CM

## 2016-10-29 LAB — WET PREP, GENITAL
Sperm: NONE SEEN
Trich, Wet Prep: NONE SEEN
Yeast Wet Prep HPF POC: NONE SEEN

## 2016-10-29 LAB — GC/CHLAMYDIA PROBE AMP (~~LOC~~) NOT AT ARMC
Chlamydia: NEGATIVE
Neisseria Gonorrhea: NEGATIVE

## 2016-10-29 LAB — RPR: RPR Ser Ql: NONREACTIVE

## 2016-10-29 LAB — HIV ANTIBODY (ROUTINE TESTING W REFLEX): HIV Screen 4th Generation wRfx: NONREACTIVE

## 2016-10-29 MED ORDER — IBUPROFEN 600 MG PO TABS: 600.0000 mg | ORAL_TABLET | Freq: Four times a day (QID) | ORAL | 0 refills | Status: DC | PRN

## 2016-10-29 MED ORDER — IBUPROFEN 600 MG PO TABS
600.0000 mg | ORAL_TABLET | Freq: Once | ORAL | Status: AC
  Administered 2016-10-29: 600 mg via ORAL
  Filled 2016-10-29 (×2): qty 1

## 2016-10-29 MED ORDER — OXYCODONE-ACETAMINOPHEN 5-325 MG PO TABS: 1.0000 | ORAL_TABLET | Freq: Four times a day (QID) | ORAL | 0 refills | Status: DC | PRN

## 2016-10-29 MED ORDER — METRONIDAZOLE 500 MG PO TABS: 500.0000 mg | ORAL_TABLET | Freq: Two times a day (BID) | ORAL | 0 refills | Status: DC

## 2016-10-29 NOTE — Discharge Instructions (Signed)
Bacterial Vaginosis Bacterial vaginosis is an infection of the vagina. It happens when too many germs (bacteria) grow in the vagina. This infection puts you at risk for infections from sex (STIs). Treating this infection can lower your risk for some STIs. You should also treat this if you are pregnant. It can cause your baby to be born early. Follow these instructions at home: Medicines  Take over-the-counter and prescription medicines only as told by your doctor.  Take or use your antibiotic medicine as told by your doctor. Do not stop taking or using it even if you start to feel better. General instructions  If you your sexual partner is a woman, tell her that you have this infection. She needs to get treatment if she has symptoms. If you have a female partner, he does not need to be treated.  During treatment: ? Avoid sex. ? Do not douche. ? Avoid alcohol as told. ? Avoid breastfeeding as told.  Drink enough fluid to keep your pee (urine) clear or pale yellow.  Keep your vagina and butt (rectum) clean. ? Wash the area with warm water every day. ? Wipe from front to back after you use the toilet.  Keep all follow-up visits as told by your doctor. This is important. Preventing this condition  Do not douche.  Use only warm water to wash around your vagina.  Use protection when you have sex. This includes: ? Latex condoms. ? Dental dams.  Limit how many people you have sex with. It is best to only have sex with the same person (be monogamous).  Get tested for STIs. Have your partner get tested.  Wear underwear that is cotton or lined with cotton.  Avoid tight pants and pantyhose. This is most important in summer.  Do not use any products that have nicotine or tobacco in them. These include cigarettes and e-cigarettes. If you need help quitting, ask your doctor.  Do not use illegal drugs.  Limit how much alcohol you drink. Contact a doctor if:  Your symptoms do not get  better, even after you are treated.  You have more discharge or pain when you pee (urinate).  You have a fever.  You have pain in your belly (abdomen).  You have pain with sex.  Your bleed from your vagina between periods. Summary  This infection happens when too many germs (bacteria) grow in the vagina.  Treating this condition can lower your risk for some infections from sex (STIs).  You should also treat this if you are pregnant. It can cause early (premature) birth.  Do not stop taking or using your antibiotic medicine even if you start to feel better. This information is not intended to replace advice given to you by your health care provider. Make sure you discuss any questions you have with your health care provider. Document Released: 01/20/2008 Document Revised: 12/27/2015 Document Reviewed: 12/27/2015 Elsevier Interactive Patient Education  2017 [place]. Ovarian Cyst An ovarian cyst is a fluid-filled sac on an ovary. The ovaries are organs that make eggs in women. Most ovarian cysts go away on their own and are not cancerous (are benign). Some cysts need treatment. Follow these instructions at home:  Take over-the-counter and prescription medicines only as told by your doctor.  Do not drive or use heavy machinery while taking prescription pain medicine.  Get pelvic exams and Pap tests as often as told by your doctor.  Return to your normal activities as told by your doctor. Ask  your doctor what activities are safe for you. °· Do not use any products that contain nicotine or tobacco, such as cigarettes and e-cigarettes. If you need help quitting, ask your doctor. °· Keep all follow-up visits as told by your doctor. This is important. °Contact a doctor if: °· Your periods are: °? Late. °? Irregular. °? Painful. °· Your periods stop. °· You have pelvic pain that does not go away. °· You have pressure on your bladder. °· You have trouble making your bladder empty when you  pee (urinate). °· You have pain during sex. °· You have any of the following in your belly (abdomen): °? A feeling of fullness. °? Pressure. °? Discomfort. °? Pain that does not go away. °? Swelling. °· You feel sick most of the time. °· You have trouble pooping (have constipation). °· You are not as hungry as usual (you lose your appetite). °· You get very bad acne. °· You start to have more hair on your body and face. °· You are gaining weight or losing weight without changing your exercise and eating habits. °· You think you may be pregnant. °Get help right away if: °· You have belly pain that is very bad or gets worse. °· You cannot eat or drink without throwing up (vomiting). °· You suddenly get a fever. °· Your period is a lot heavier than usual. °This information is not intended to replace advice given to you by your health care provider. Make sure you discuss any questions you have with your health care provider. °Document Released: 09/29/2007 Document Revised: 10/31/2015 Document Reviewed: 09/14/2015 °Elsevier Interactive Patient Education © 2017 [employer]. ° °

## 2016-10-29 NOTE — MAU Provider Note (Signed)
History     CSN: [id number]  Arrival date and time: 10/28/16 2046   First Provider Initiated Contact with Patient 10/29/16 0157      Chief Complaint  Patient presents with  . Abdominal Pain   HPI Ms. Kristin Kemp is a 26 y.o. G2P1011 who presents to MAU today with complaint of suprapubic cramping worse on right than left x 10 days. She has not taken anything for pain. She rates her pain at 6/10 now. She has also had headache. She denies fever, discharge, bleeding, vomiting, diarrhea or constipation or UTI symptoms. She has had nausea. She is sexually active and uses condoms sometimes.   OB History    Gravida Para Term Preterm AB Living   2 1 1   1 1    SAB TAB Ectopic Multiple Live Births   1     0 1      Past Medical History:  Diagnosis Date  . Abnormal Pap smear   . Anemia   . Herpes   . Late prenatal care   . Ovarian cyst   . PID (pelvic inflammatory disease)   . Urinary tract infection     Past Surgical History:  Procedure Laterality Date  . DILATION AND CURETTAGE OF UTERUS  2009    Family History  Problem Relation Age of Onset  . Hypertension Maternal Grandmother   . Hyperlipidemia Maternal Grandmother   . Heart disease Maternal Grandmother        enlarged heart  . Diabetes Maternal Uncle   . Hypertension Maternal Uncle   . Kidney disease Maternal Uncle   . Cancer Paternal Aunt        lung  . Cancer Paternal Uncle        lung  . Diabetes Maternal Grandfather   . Anesthesia problems Neg Hx     Social History  Substance Use Topics  . Smoking status: Current Every Day Smoker    Packs/day: 0.50    Types: Cigarettes    Last attempt to quit: 10/21/2010  . Smokeless tobacco: Current User  . Alcohol use Yes    Allergies:  Allergies  Allergen Reactions  . Chocolate Hives and Itching  . Cinnamon Other (See Comments)    Turns tongue raw.  . Latex Other (See Comments)    Reports vaginal sensitivity after latex condom use.    No prescriptions  prior to admission.    Review of Systems  Constitutional: Negative for fever.  Gastrointestinal: Positive for abdominal pain and nausea. Negative for constipation, diarrhea and vomiting.  Genitourinary: Positive for pelvic pain. Negative for dysuria, frequency, urgency, vaginal bleeding and vaginal discharge.   Physical Exam   Blood pressure 127/90, pulse 67, temperature 98.4 F (36.9 C), temperature source Oral, resp. rate 19, height 5\' 3"  (1.6 m), weight 116 lb 12 oz (53 kg), last menstrual period 10/09/2016.  Physical Exam  Nursing note and vitals reviewed. Constitutional: She is oriented to person, place, and time. She appears well-developed and well-nourished. No distress.  HENT:  Head: Normocephalic and atraumatic.  Cardiovascular: Normal rate.   Respiratory: Effort normal.  GI: Soft. She exhibits no distension and no mass. There is tenderness (very mild lower abdominal tenderness to palpation bilaterally). There is no rebound and no guarding.  Genitourinary: Uterus is not enlarged and not tender. Cervix exhibits no motion tenderness, no discharge and no friability. Right adnexum displays tenderness and fullness. Right adnexum displays no mass. Left adnexum displays no mass, no tenderness and no  fullness. No bleeding in the vagina. Vaginal discharge (moderate thin, white discharge noted) found.  Neurological: She is alert and oriented to person, place, and time.  Skin: Skin is warm and dry. No erythema.  Psychiatric: She has a normal mood and affect.    Results for orders placed or performed during the hospital encounter of 10/28/16 (from the past 24 hour(s))  Urinalysis, Routine w reflex microscopic     Status: None   Collection Time: 10/28/16  9:17 PM  Result Value Ref Range   Color, Urine YELLOW YELLOW   APPearance CLEAR CLEAR   Specific Gravity, Urine 1.012 1.005 - 1.030   pH 6.0 5.0 - 8.0   Glucose, UA NEGATIVE NEGATIVE mg/dL   Hgb urine dipstick NEGATIVE NEGATIVE    Bilirubin Urine NEGATIVE NEGATIVE   Ketones, ur NEGATIVE NEGATIVE mg/dL   Protein, ur NEGATIVE NEGATIVE mg/dL   Nitrite NEGATIVE NEGATIVE   Leukocytes, UA NEGATIVE NEGATIVE  Pregnancy, urine POC     Status: None   Collection Time: 10/28/16  9:34 PM  Result Value Ref Range   Preg Test, Ur NEGATIVE NEGATIVE  Wet prep, genital     Status: Abnormal   Collection Time: 10/29/16  2:06 AM  Result Value Ref Range   Yeast Wet Prep HPF POC NONE SEEN NONE SEEN   Trich, Wet Prep NONE SEEN NONE SEEN   Clue Cells Wet Prep HPF POC PRESENT (A) NONE SEEN   WBC, Wet Prep HPF POC MANY (A) NONE SEEN   Sperm NONE SEEN    [place] Transvaginal Non-ob  Result Date: 10/29/2016 CLINICAL DATA:  Right adnexal tenderness. Lower abdominal pain for 10 days. EXAM: TRANSABDOMINAL AND TRANSVAGINAL ULTRASOUND OF PELVIS TECHNIQUE: Both transabdominal and transvaginal ultrasound examinations of the pelvis were performed. Transabdominal technique was performed for global imaging of the pelvis including uterus, ovaries, adnexal regions, and pelvic cul-de-sac. It was necessary to proceed with endovaginal exam following the transabdominal exam to visualize the uterus, ovaries, endometrium and adnexa. COMPARISON:  Pelvic ultrasound 01/11/2014 FINDINGS: Uterus Measurements: 9.1 x 4.2 x 4.9 cm. No fibroids or other mass visualized. Endometrium Thickness: 5 mm.  No focal abnormality visualized. Right ovary Measurements: 5.3 x 3.7 x 4. Complex cyst measuring 3.2 x 3.0 x 3.3 cm with heterogeneous low-level internal echoes and internal septations. Blood flow seen. No adnexal mass. Left ovary Measurements: 4.5 x 3.6 x 3.3 cm. Complex cyst in the left ovary measures 2.6 x 1.5 x 1.9 cm. Blood flow seen. No adnexal mass. Other findings No abnormal free fluid.  Trace pelvic free fluid is physiologic. IMPRESSION: 1. Small complex cysts in both ovaries, slightly larger on the right measuring 3.3 cm, suggesting hemorrhagic cysts. 2. Normal sonographic  appearance of the uterus and endometrium. Electronically Signed   By: Rubye OaksMelanie  Ehinger M.D.   On: 10/29/2016 03:10   [place] Pelvis Complete  Result Date: 10/29/2016 CLINICAL DATA:  Right adnexal tenderness. Lower abdominal pain for 10 days. EXAM: TRANSABDOMINAL AND TRANSVAGINAL ULTRASOUND OF PELVIS TECHNIQUE: Both transabdominal and transvaginal ultrasound examinations of the pelvis were performed. Transabdominal technique was performed for global imaging of the pelvis including uterus, ovaries, adnexal regions, and pelvic cul-de-sac. It was necessary to proceed with endovaginal exam following the transabdominal exam to visualize the uterus, ovaries, endometrium and adnexa. COMPARISON:  Pelvic ultrasound 01/11/2014 FINDINGS: Uterus Measurements: 9.1 x 4.2 x 4.9 cm. No fibroids or other mass visualized. Endometrium Thickness: 5 mm.  No focal abnormality visualized. Right ovary Measurements: 5.3 x 3.7 x  4. Complex cyst measuring 3.2 x 3.0 x 3.3 cm with heterogeneous low-level internal echoes and internal septations. Blood flow seen. No adnexal mass. Left ovary Measurements: 4.5 x 3.6 x 3.3 cm. Complex cyst in the left ovary measures 2.6 x 1.5 x 1.9 cm. Blood flow seen. No adnexal mass. Other findings No abnormal free fluid.  Trace pelvic free fluid is physiologic. IMPRESSION: 1. Small complex cysts in both ovaries, slightly larger on the right measuring 3.3 cm, suggesting hemorrhagic cysts. 2. Normal sonographic appearance of the uterus and endometrium. Electronically Signed   By: Rubye Oaks M.D.   On: 10/29/2016 03:10    MAU Course  Procedures  None  MDM UPT - negative UA, wet prep, GC/Chlamydia, HIV, RPR and [place] today  Patient declines pain medication at this time   Assessment and Plan  A: Bilateral ovarian cysts, likely hemorrhagic Bacterial vaginosis   P: Discharge home Rx for Ibuprofen, Percocet and Flagyl given  Warning signs for torsion discussed Patient advised to follow-up with [facility]  if symptoms persist or worsen Patient may return to MAU as needed or if her condition were to change or worsen   Vonzella Nipple, PA-C 10/29/2016, 4:08 AM

## 2017-05-05 ENCOUNTER — Encounter (HOSPITAL_COMMUNITY): Payer: Self-pay

## 2017-05-05 ENCOUNTER — Emergency Department (HOSPITAL_COMMUNITY)
Admission: EM | Admit: 2017-05-05 | Discharge: 2017-05-05 | Disposition: A | Discharge status: Home / Self Care | Payer: Medicaid Other | Attending: Emergency Medicine | Admitting: Emergency Medicine

## 2017-05-05 DIAGNOSIS — Z91040 Latex allergy status: Secondary | ICD-10-CM | POA: Diagnosis not present

## 2017-05-05 DIAGNOSIS — F17210 Nicotine dependence, cigarettes, uncomplicated: Secondary | ICD-10-CM | POA: Diagnosis not present

## 2017-05-05 DIAGNOSIS — L03213 Periorbital cellulitis: Secondary | ICD-10-CM | POA: Insufficient documentation

## 2017-05-05 DIAGNOSIS — R220 Localized swelling, mass and lump, head: Secondary | ICD-10-CM

## 2017-05-05 DIAGNOSIS — Z79899 Other long term (current) drug therapy: Secondary | ICD-10-CM | POA: Diagnosis not present

## 2017-05-05 MED ORDER — HYDROXYZINE HCL 25 MG PO TABS: 25.0000 mg | ORAL_TABLET | Freq: Four times a day (QID) | ORAL | 0 refills | Status: DC

## 2017-05-05 MED ORDER — CLINDAMYCIN HCL 150 MG PO CAPS: 150.0000 mg | ORAL_CAPSULE | Freq: Two times a day (BID) | ORAL | 0 refills | Status: AC

## 2017-05-05 NOTE — ED Provider Notes (Signed)
[facility] Provider Note   CSN: [id number] Arrival date & time: 05/05/17  [id number]     History   Chief Complaint Chief Complaint  Patient presents with  . Facial Swelling    HPI Kristin Kemp is a 27 y.o. female with history of herpes and chickenpox presents for evaluation of left facial swelling since yesterday. Swelling located to left lower eyelid associated with redness, itching, swelling and dull pain. Has been getting left-sided headaches since yesterday. He thinks a spider or an insect may have bit her in her sleep. Woke up yesterday and noticed swelling, gradually worsening. Has taken Benadryl and ice without significant relief. Reports history of allergy to cinnamon and latex but denies recent exposure. No new topical creams, lotions, face wash, makeup. Does wear eye contacts but has not worn them in the last month. Denies fevers, chills, recent URI, ear pain or ear drainage, eye drainage, redness of the eyeball, pain with eye movement, known trauma. No sick contacts with similar symptoms.  HPI  Past Medical History:  Diagnosis Date  . Abnormal Pap smear   . Anemia   . Herpes   . Late prenatal care   . Ovarian cyst   . PID (pelvic inflammatory disease)   . Urinary tract infection     Patient Active Problem List   Diagnosis Date Noted  . Abdominal pain, generalized 01/11/2016  . BV (bacterial vaginosis) 01/11/2016  . Encounter for cosmetic surgery 07/07/2015    Past Surgical History:  Procedure Laterality Date  . DILATION AND CURETTAGE OF UTERUS  2009    OB History    Gravida Para Term Preterm AB Living   2 1 1   1 1    SAB TAB Ectopic Multiple Live Births   1     0 1       Home Medications    Prior to Admission medications   Medication Sig Start Date End Date Taking? Authorizing Provider  clindamycin (CLEOCIN) 150 MG capsule Take 1 capsule (150 mg total) by mouth 2 (two) times daily for 7 days. 05/05/17 05/12/17   Liberty HandyGibbons, Rayann Jolley J, PA-C  hydrOXYzine (ATARAX/VISTARIL) 25 MG tablet Take 1 tablet (25 mg total) by mouth every 6 (six) hours. 05/05/17   Liberty HandyGibbons, Odeth Bry J, PA-C  ibuprofen (ADVIL,MOTRIN) 600 MG tablet Take 1 tablet (600 mg total) by mouth every 6 (six) hours as needed. 10/29/16   Marny LowensteinWenzel, Julie N, PA-C  metroNIDAZOLE (FLAGYL) 500 MG tablet Take 1 tablet (500 mg total) by mouth 2 (two) times daily. 10/29/16   Marny LowensteinWenzel, Julie N, PA-C  Multiple Vitamin (MULTIVITAMIN WITH MINERALS) TABS tablet Take 1 tablet by mouth daily.    [provider]  oxyCODONE-acetaminophen (PERCOCET/ROXICET) 5-325 MG tablet Take 1-2 tablets by mouth every 6 (six) hours as needed for severe pain. 10/29/16   Marny LowensteinWenzel, Julie N, PA-C  simethicone (GAS-X) 80 MG chewable tablet Chew 1 tablet (80 mg total) by mouth every 6 (six) hours as needed for flatulence. Patient not taking: Reported on 05/26/2016 01/11/16   Aviva SignsWilliams, Marie L, CNM    Family History Family History  Problem Relation Age of Onset  . Hypertension Maternal Grandmother   . Hyperlipidemia Maternal Grandmother   . Heart disease Maternal Grandmother        enlarged heart  . Diabetes Maternal Uncle   . Hypertension Maternal Uncle   . Kidney disease Maternal Uncle   . Cancer Paternal Aunt  lung  . Cancer Paternal Uncle        lung  . Diabetes Maternal Grandfather   . Anesthesia problems Neg Hx     Social History Social History   Tobacco Use  . Smoking status: Current Every Day Smoker    Packs/day: 0.50    Types: Cigarettes    Last attempt to quit: 10/21/2010    Years since quitting: 6.5  . Smokeless tobacco: Current User  Substance Use Topics  . Alcohol use: Yes  . Drug use: Yes    Types: Marijuana     Allergies   Chocolate; Cinnamon; and Latex   Review of Systems Review of Systems  HENT: Positive for facial swelling.   Neurological: Positive for headaches.  All other systems reviewed and are negative.    Physical Exam Updated  Vital Signs BP 111/63   Pulse 75   Temp 98.3 F (36.8 C) (Oral)   Resp 16   Ht 5\' 3"  (1.6 m)   Wt 53.1 kg (117 lb)   LMP 04/13/2017   SpO2 100%   BMI 20.73 kg/m   Physical Exam  Constitutional: She is oriented to person, place, and time. She appears well-developed and well-nourished. No distress.  NAD. Nontoxic.  HENT:  Head: Normocephalic and atraumatic.  Right Ear: External ear normal.  Left Ear: External ear normal.  Nose: Nose normal.  Mild left lower eyelid edema, tenderness and erythema. Edema does not cross midline or nose and does not extend up to upper eyelid. No globe tenderness, scleral or conjunctival injection. No pain with EOMs. PERRL and EOMs intact bilaterally. No sinus tenderness. Tympanic membranes clear bilaterally. No significant nasal mucosal edema, oropharynx and tonsils clear.  Eyes: Conjunctivae and EOM are normal. No scleral icterus.  Neck: Normal range of motion. Neck supple.  Cardiovascular: Normal rate, regular rhythm and normal heart sounds.  No murmur heard. Pulmonary/Chest: Effort normal and breath sounds normal. She has no wheezes.  Musculoskeletal: Normal range of motion. She exhibits no deformity.  Neurological: She is alert and oriented to person, place, and time.  Skin: Skin is warm and dry. Capillary refill takes less than 2 seconds.  See picture. Mild erythema and edema to left lower eyelid, 3 discrete nontender papules to the corner of left eye. No vesicular or pustular lesions noted.  Psychiatric: She has a normal mood and affect. Her behavior is normal. Judgment and thought content normal.  Nursing note and vitals reviewed.      ED Treatments / Results  Labs (all labs ordered are listed, but only abnormal results are displayed) Labs Reviewed - No data to display  EKG  EKG Interpretation None       Radiology No results found.  Procedures Procedures (including critical care time)  Medications Ordered in ED Medications -  No data to display   Initial Impression / Assessment and Plan / ED Course  I have reviewed the triage vital signs and the nursing notes.  Pertinent labs & imaging results that were available during my care of the patient were reviewed by me and considered in my medical decision making (see chart for details).    27 year old presents for edema, erythema and mild tenderness to left lower eyelid since yesterday. He developed 3 papules after placing ice to the area.  Please see picture for physical exam findings.  Considering contact dermatitis, insect localized reaction, preseptal cellulitis. Considered shingles however exam not consistent with this. No globe injury or signs of conjunctivitis. We will  discharge with hydroxyzine, preseptal cellulitis antibiotics and high-dose anti-inflammatories. Discussed signs and symptoms that would warrant return to the ED with patient, she is aware and agrees with ED treatment and discharge plan.  Final Clinical Impressions(s) / ED Diagnoses   Final diagnoses:  Facial swelling  Periorbital cellulitis of left eye    ED Discharge Orders        Ordered    hydrOXYzine (ATARAX/VISTARIL) 25 MG tablet  Every 6 hours     05/05/17 1100    clindamycin (CLEOCIN) 150 MG capsule  2 times daily     05/05/17 1100       Liberty Handy, [place] 05/05/17 1133    Tegeler, Canary Brim, MD 05/05/17 1140

## 2017-05-05 NOTE — ED Notes (Signed)
Pt stat she understands instructions. Home stable with stead gait.

## 2017-05-05 NOTE — Discharge Instructions (Signed)
I'm unsure of what is causing your symptoms. I suspect exposure to an insect may be the reason. We will treat your swelling and itching and prevent an infection.  Take hydroxyzine for itching as prescribed. You can apply thin layer of over-the-counter anti-itch cream like hydrocortisone. Take Tylenol/ibuprofen for swelling and pain. Take clindamycin to prevent further infection around eye.  As we discussed, this does not look like shingles, monitor your symptoms. Return for worsening bumps to your face or eyes, fevers, chills, pain with eye movements, drainage.

## 2017-05-05 NOTE — ED Triage Notes (Signed)
Per Pt, Pt is coming from home with complaints of left eye swelling that started yesterday. Pt reports that she has a couple bumps noted to the left eye with some erythema and edema.

## 2017-06-27 ENCOUNTER — Ambulatory Visit (INDEPENDENT_AMBULATORY_CARE_PROVIDER_SITE_OTHER): Payer: Self-pay | Admitting: Physician Assistant

## 2017-06-27 ENCOUNTER — Other Ambulatory Visit: Payer: Self-pay

## 2017-06-27 ENCOUNTER — Encounter: Payer: Self-pay | Admitting: Physician Assistant

## 2017-06-27 VITALS — BP 118/78 | HR 80 | Resp 16 | Wt 121.6 lb

## 2017-06-27 DIAGNOSIS — Z130 Encounter for screening for diseases of the blood and blood-forming organs and certain disorders involving the immune mechanism: Secondary | ICD-10-CM

## 2017-06-27 DIAGNOSIS — Z13228 Encounter for screening for other metabolic disorders: Secondary | ICD-10-CM

## 2017-06-27 DIAGNOSIS — Z1329 Encounter for screening for other suspected endocrine disorder: Secondary | ICD-10-CM

## 2017-06-27 NOTE — Progress Notes (Signed)
    06/27/2017 2:59 PM   DOB: 14-Apr-1991 / MRN: [id number]  SUBJECTIVE:  Kristin Kemp is a 27 y.o. female presenting for titers.  Her immunization schedule is near complete per the [employer].  Does show that she needs some titers today listed below in assessment and plan.  She feels well and denies complaint.  She plans to study phlebotomy G [facility].  She is allergic to chocolate; cinnamon; and latex.   She  has a past medical history of Abnormal Pap smear, Allergy, Anemia, Herpes, Late prenatal care, Ovarian cyst, PID (pelvic inflammatory disease), and Urinary tract infection.    She  reports that she has been smoking cigarettes.  She has been smoking about 0.50 packs per day. She uses smokeless tobacco. She reports that she drinks alcohol. She reports that she uses drugs. Drug: Marijuana. She  reports that she currently engages in sexual activity. The patient  has a past surgical history that includes Dilation and curettage of uterus (2009).  Her family history includes Cancer in her paternal aunt and paternal uncle; Diabetes in her maternal grandfather and maternal uncle; Heart disease in her maternal grandmother; Hyperlipidemia in her maternal grandmother; Hypertension in her maternal grandmother and maternal uncle; Kidney disease in her maternal uncle.  Review of Systems  Constitutional: Negative for chills, diaphoresis and fever.  Gastrointestinal: Negative for nausea.  Skin: Negative for rash.  Neurological: Negative for dizziness.    The problem list and medications were reviewed and updated by myself where necessary and exist elsewhere in the encounter.   OBJECTIVE:  BP 118/78 (BP Location: Right Arm, Patient Position: Sitting, Cuff Size: Normal)   Pulse 80   Resp 16   Wt 121 lb 9.6 oz (55.2 kg)   SpO2 100%   BMI 21.54 kg/m   Physical Exam  Constitutional: She is active.  Non-toxic appearance.  Cardiovascular: Normal rate.  Pulmonary/Chest:  Effort normal. No tachypnea.  Neurological: She is alert.  Skin: Skin is warm and dry. She is not diaphoretic. No pallor.    No results found for this or any previous visit (from the past 72 hour(s)).  No results found.  ASSESSMENT AND PLAN:  Kristin Kemp was seen today for immunizations.  Diagnoses and all orders for this visit:  Screening for endocrine, metabolic, and immunity disorder: Well-appearing female here today for labs. -     Varicella zoster antibody, IgG    The patient is advised to call or return to clinic if she does not see an improvement in symptoms, or to seek the care of the closest emergency department if she worsens with the above plan.   Deliah BostonMichael Dian Laprade, MHS, PA-C [facility] at [facility] 06/27/2017 2:59 PM

## 2017-06-28 LAB — VARICELLA ZOSTER ANTIBODY, IGG: VARICELLA: 1441 {index} (ref >165)

## 2017-08-20 ENCOUNTER — Inpatient Hospital Stay (HOSPITAL_COMMUNITY)
Admission: AD | Admit: 2017-08-20 | Discharge: 2017-08-20 | Disposition: A | Discharge status: Home / Self Care | Payer: Medicaid Other | Source: Ambulatory Visit | Attending: Obstetrics and Gynecology | Admitting: Obstetrics and Gynecology

## 2017-08-20 ENCOUNTER — Inpatient Hospital Stay (HOSPITAL_COMMUNITY): Payer: Medicaid Other

## 2017-08-20 ENCOUNTER — Encounter (HOSPITAL_COMMUNITY): Payer: Self-pay | Admitting: *Deleted

## 2017-08-20 DIAGNOSIS — R1030 Lower abdominal pain, unspecified: Secondary | ICD-10-CM | POA: Diagnosis present

## 2017-08-20 DIAGNOSIS — F17210 Nicotine dependence, cigarettes, uncomplicated: Secondary | ICD-10-CM | POA: Insufficient documentation

## 2017-08-20 DIAGNOSIS — Z3A01 Less than 8 weeks gestation of pregnancy: Secondary | ICD-10-CM | POA: Insufficient documentation

## 2017-08-20 DIAGNOSIS — R109 Unspecified abdominal pain: Secondary | ICD-10-CM

## 2017-08-20 DIAGNOSIS — O26891 Other specified pregnancy related conditions, first trimester: Secondary | ICD-10-CM | POA: Diagnosis not present

## 2017-08-20 DIAGNOSIS — O3680X0 Pregnancy with inconclusive fetal viability, not applicable or unspecified: Secondary | ICD-10-CM

## 2017-08-20 DIAGNOSIS — O99331 Smoking (tobacco) complicating pregnancy, first trimester: Secondary | ICD-10-CM | POA: Diagnosis not present

## 2017-08-20 DIAGNOSIS — O283 Abnormal ultrasonic finding on antenatal screening of mother: Secondary | ICD-10-CM

## 2017-08-20 LAB — URINALYSIS, ROUTINE W REFLEX MICROSCOPIC
Bilirubin Urine: NEGATIVE
Glucose, UA: NEGATIVE mg/dL
Hgb urine dipstick: NEGATIVE
Ketones, ur: NEGATIVE mg/dL
Leukocytes, UA: NEGATIVE
NITRITE: NEGATIVE
PROTEIN: NEGATIVE mg/dL
SPECIFIC GRAVITY, URINE: 1.008 (ref 1.005–1.030)
pH: 6 (ref 5.0–8.0)

## 2017-08-20 LAB — HCG, QUANTITATIVE, PREGNANCY: HCG, BETA CHAIN, QUANT, S: 313 m[IU]/mL — AB (ref <5)

## 2017-08-20 LAB — WET PREP, GENITAL
Sperm: NONE SEEN
Trich, Wet Prep: NONE SEEN
Yeast Wet Prep HPF POC: NONE SEEN

## 2017-08-20 LAB — POCT PREGNANCY, URINE: PREG TEST UR: POSITIVE — AB

## 2017-08-20 NOTE — MAU Provider Note (Signed)
History   G3P1011 @ apx 4.2 wks by lmp in with low abd pain for several days now. Denies vag bleeding. States inconsistent condom use use as contraception. Not a planned preg.  CSN: [id number]  Arrival date & time 08/20/17  1652   First Provider Initiated Contact with Patient 08/20/17 1752      Chief Complaint  Patient presents with  . Abdominal Pain    HPI  Past Medical History:  Diagnosis Date  . Abnormal Pap smear   . Allergy   . Anemia   . Herpes   . Late prenatal care   . Ovarian cyst   . PID (pelvic inflammatory disease)   . Urinary tract infection     Past Surgical History:  Procedure Laterality Date  . DILATION AND CURETTAGE OF UTERUS  2009    Family History  Problem Relation Age of Onset  . Hypertension Maternal Grandmother   . Hyperlipidemia Maternal Grandmother   . Heart disease Maternal Grandmother        enlarged heart  . Diabetes Maternal Uncle   . Hypertension Maternal Uncle   . Kidney disease Maternal Uncle   . Cancer Paternal Aunt        lung  . Cancer Paternal Uncle        lung  . Diabetes Maternal Grandfather   . Anesthesia problems Neg Hx     Social History   Tobacco Use  . Smoking status: Current Every Day Smoker    Packs/day: 0.50    Types: Cigarettes    Last attempt to quit: 10/21/2010    Years since quitting: 6.8  . Smokeless tobacco: Current User  Substance Use Topics  . Alcohol use: Yes  . Drug use: Yes    Types: Marijuana    Comment: occasionally    OB History    Gravida  3   Para  1   Term  1   Preterm      AB  1   Living  1     SAB  1   TAB      Ectopic      Multiple  0   Live Births  1           Review of Systems  Constitutional: Negative.   HENT: Negative.   Eyes: Negative.   Respiratory: Negative.   Cardiovascular: Negative.   Gastrointestinal: Positive for abdominal pain.  Endocrine: Negative.   Genitourinary: Negative.   Musculoskeletal: Negative.   Skin: Negative.    Allergic/Immunologic: Negative.   Neurological: Negative.   Hematological: Negative.   Psychiatric/Behavioral: Negative.     Allergies  Chocolate; Cinnamon; and Latex  Home Medications    BP 114/72 (BP Location: Right Arm)   Pulse 85   Temp 98.2 F (36.8 C) (Oral)   Resp 16   LMP 07/21/2017 (Exact Date)   SpO2 100%   Physical Exam  Constitutional: She is oriented to person, place, and time. She appears well-developed and well-nourished.  HENT:  Head: Normocephalic.  Cardiovascular: Normal rate, regular rhythm, normal heart sounds and intact distal pulses.  Pulmonary/Chest: Effort normal and breath sounds normal.  Abdominal: Soft. Normal appearance and bowel sounds are normal.  Genitourinary: Vagina normal and uterus normal.  Neurological: She is alert and oriented to person, place, and time.  Skin: Skin is warm and dry.  Psychiatric: She has a normal mood and affect. Her behavior is normal.    MAU Course  Procedures (including critical care time)  Labs Reviewed  POCT PREGNANCY, URINE - Abnormal; Notable for the following components:      Result Value   Preg Test, Ur POSITIVE (*)    All other components within normal limits  WET PREP, GENITAL  URINALYSIS, ROUTINE W REFLEX MICROSCOPIC  HCG, QUANTITATIVE, PREGNANCY  GC/CHLAMYDIA PROBE AMP ([place]) NOT AT [facility]   No results found.  Results for orders placed or performed during the hospital encounter of 08/20/17 (from the past 24 hour(s))  Urinalysis, Routine w reflex microscopic     Status: None   Collection Time: 08/20/17  5:22 PM  Result Value Ref Range   Color, Urine YELLOW YELLOW   APPearance CLEAR CLEAR   Specific Gravity, Urine 1.008 1.005 - 1.030   pH 6.0 5.0 - 8.0   Glucose, UA NEGATIVE NEGATIVE mg/dL   Hgb urine dipstick NEGATIVE NEGATIVE   Bilirubin Urine NEGATIVE NEGATIVE   Ketones, ur NEGATIVE NEGATIVE mg/dL   Protein, ur NEGATIVE NEGATIVE mg/dL   Nitrite NEGATIVE NEGATIVE   Leukocytes, UA  NEGATIVE NEGATIVE  Pregnancy, urine POC     Status: Abnormal   Collection Time: 08/20/17  5:34 PM  Result Value Ref Range   Preg Test, Ur POSITIVE (A) NEGATIVE  Wet prep, genital     Status: Abnormal   Collection Time: 08/20/17  5:55 PM  Result Value Ref Range   Yeast Wet Prep HPF POC NONE SEEN NONE SEEN   Trich, Wet Prep NONE SEEN NONE SEEN   Clue Cells Wet Prep HPF POC PRESENT (A) NONE SEEN   WBC, Wet Prep HPF POC FEW (A) NONE SEEN   Sperm NONE SEEN   hCG, quantitative, pregnancy     Status: Abnormal   Collection Time: 08/20/17  6:08 PM  Result Value Ref Range   hCG, Beta Chain, Quant, S 313 (H) <5 mIU/mL  . 1. Abdominal pain in pregnancy, first trimester       MDM  VSS, abd soft and non tender. Wet prep pos clue . Cultures obtained. US shows no adnexal masses nor any IUP.  POC discussed with pt. States pain is 3/10. To come back if increased abd pain to come back to ER. Will repeat quant Monday in clinic

## 2017-08-20 NOTE — Discharge Instructions (Signed)

## 2017-08-20 NOTE — Progress Notes (Signed)
Darlene Lawson CNM in earlier to discuss test results and d/c plan. Written and verbal d/c instructions given and understanding voiced. 

## 2017-08-20 NOTE — MAU Note (Signed)
Intermittent abdominal pain started last week. Rates 4/10. Denies vaginal discharge/bleeding.  Unsure if pregnant.  LMP 07/21/17.

## 2017-08-22 ENCOUNTER — Ambulatory Visit (INDEPENDENT_AMBULATORY_CARE_PROVIDER_SITE_OTHER): Payer: Self-pay | Admitting: General Practice

## 2017-08-22 DIAGNOSIS — O283 Abnormal ultrasonic finding on antenatal screening of mother: Secondary | ICD-10-CM

## 2017-08-22 DIAGNOSIS — O3680X0 Pregnancy with inconclusive fetal viability, not applicable or unspecified: Secondary | ICD-10-CM

## 2017-08-22 LAB — HCG, QUANTITATIVE, PREGNANCY: hCG, Beta Chain, Quant, S: 631 m[IU]/mL — ABNORMAL HIGH (ref <5)

## 2017-08-22 NOTE — Progress Notes (Signed)
Patient here for stat bhcg today. Patient denies bleeding and reports off/on pain but it is less than MAU visit. Discussed with patient we are monitoring her beta hcg levels today and asked she wait in lobby for results/updated plan of care. Patient verbalized understanding & had no questions at this time.  Reviewed results with Thressa Sheller who finds appropriate rise in bhcg levels- patient should have follow up ultrasound in 3 weeks. Informed patient of results & scheduled ultrasound for 5/21. Ectopic precautions reviewed. Patient verbalized understanding & had no questions.

## 2017-08-22 NOTE — Progress Notes (Signed)
I have reviewed the nurse's note, and agree with the plan of care.  Thressa Sheller 4:14 PM 08/22/17

## 2017-08-23 LAB — GC/CHLAMYDIA PROBE AMP (~~LOC~~) NOT AT ARMC
CHLAMYDIA, DNA PROBE: NEGATIVE
NEISSERIA GONORRHEA: NEGATIVE

## 2017-08-31 ENCOUNTER — Encounter: Payer: Self-pay | Admitting: Family Medicine

## 2017-09-05 ENCOUNTER — Telehealth: Payer: Self-pay | Admitting: *Deleted

## 2017-09-05 NOTE — Telephone Encounter (Signed)
Kristin Kemp called and left a voicemail 5/8 pm stating she was seen on Monday and needs a paper saying she is pregnant to take to social services. I called her and left a message she can come by office during office hours and get a letter. Will also send MyChart message.

## 2017-09-13 ENCOUNTER — Ambulatory Visit (HOSPITAL_COMMUNITY): Payer: Medicaid Other

## 2017-10-06 LAB — OB RESULTS CONSOLE HEPATITIS B SURFACE ANTIGEN: Hepatitis B Surface Ag: NEGATIVE

## 2017-10-06 LAB — OB RESULTS CONSOLE ABO/RH: RH Type: POSITIVE

## 2017-10-06 LAB — OB RESULTS CONSOLE RPR: RPR: NONREACTIVE

## 2017-10-06 LAB — OB RESULTS CONSOLE RUBELLA ANTIBODY, IGM: Rubella: IMMUNE

## 2017-10-06 LAB — OB RESULTS CONSOLE ANTIBODY SCREEN: Antibody Screen: NEGATIVE

## 2017-12-26 ENCOUNTER — Other Ambulatory Visit: Payer: Self-pay

## 2017-12-26 ENCOUNTER — Emergency Department (HOSPITAL_COMMUNITY)
Admission: EM | Admit: 2017-12-26 | Discharge: 2017-12-27 | Disposition: A | Discharge status: Home / Self Care | Payer: Medicaid Other | Attending: Emergency Medicine | Admitting: Emergency Medicine

## 2017-12-26 ENCOUNTER — Encounter (HOSPITAL_COMMUNITY): Payer: Self-pay | Admitting: Emergency Medicine

## 2017-12-26 DIAGNOSIS — Z5321 Procedure and treatment not carried out due to patient leaving prior to being seen by health care provider: Secondary | ICD-10-CM | POA: Diagnosis not present

## 2017-12-26 DIAGNOSIS — O26892 Other specified pregnancy related conditions, second trimester: Secondary | ICD-10-CM | POA: Diagnosis not present

## 2017-12-26 DIAGNOSIS — O9989 Other specified diseases and conditions complicating pregnancy, childbirth and the puerperium: Secondary | ICD-10-CM | POA: Diagnosis not present

## 2017-12-26 DIAGNOSIS — R1010 Upper abdominal pain, unspecified: Secondary | ICD-10-CM | POA: Diagnosis not present

## 2017-12-26 DIAGNOSIS — Z3A22 22 weeks gestation of pregnancy: Secondary | ICD-10-CM | POA: Insufficient documentation

## 2017-12-26 LAB — CBC WITH DIFFERENTIAL/PLATELET
ABS IMMATURE GRANULOCYTES: 0.1 10*3/uL (ref 0.0–0.1)
BASOS PCT: 0 %
Basophils Absolute: 0 10*3/uL (ref 0.0–0.1)
EOS ABS: 0.2 10*3/uL (ref 0.0–0.7)
Eosinophils Relative: 1 %
HCT: 31.2 % — ABNORMAL LOW (ref 36.0–46.0)
Hemoglobin: 10.2 g/dL — ABNORMAL LOW (ref 12.0–15.0)
IMMATURE GRANULOCYTES: 1 %
Lymphocytes Relative: 19 %
Lymphs Abs: 2.4 10*3/uL (ref 0.7–4.0)
MCH: 27.9 pg (ref 26.0–34.0)
MCHC: 32.7 g/dL (ref 30.0–36.0)
MCV: 85.2 fL (ref 78.0–100.0)
Monocytes Absolute: 1 10*3/uL (ref 0.1–1.0)
Monocytes Relative: 8 %
NEUTROS PCT: 71 %
Neutro Abs: 9 10*3/uL — ABNORMAL HIGH (ref 1.7–7.7)
Platelets: 310 10*3/uL (ref 150–400)
RBC: 3.66 MIL/uL — AB (ref 3.87–5.11)
RDW: 13.7 % (ref 11.5–15.5)
WBC: 12.6 10*3/uL — AB (ref 4.0–10.5)

## 2017-12-26 LAB — URINALYSIS, ROUTINE W REFLEX MICROSCOPIC
Bilirubin Urine: NEGATIVE
GLUCOSE, UA: NEGATIVE mg/dL
Hgb urine dipstick: NEGATIVE
KETONES UR: NEGATIVE mg/dL
Nitrite: NEGATIVE
PROTEIN: NEGATIVE mg/dL
Specific Gravity, Urine: 1.011 (ref 1.005–1.030)
pH: 7 (ref 5.0–8.0)

## 2017-12-26 LAB — COMPREHENSIVE METABOLIC PANEL
ALBUMIN: 3.2 g/dL — AB (ref 3.5–5.0)
ALT: 41 U/L (ref 0–44)
AST: 32 U/L (ref 15–41)
Alkaline Phosphatase: 44 U/L (ref 38–126)
Anion gap: 9 (ref 5–15)
BUN: 6 mg/dL (ref 6–20)
CALCIUM: 9.3 mg/dL (ref 8.9–10.3)
CHLORIDE: 110 mmol/L (ref 98–111)
CO2: 20 mmol/L — AB (ref 22–32)
Creatinine, Ser: 0.68 mg/dL (ref 0.44–1.00)
GFR calc Af Amer: >60 mL/min (ref >60)
GFR calc non Af Amer: >60 mL/min (ref >60)
GLUCOSE: 86 mg/dL (ref 70–99)
POTASSIUM: 3.8 mmol/L (ref 3.5–5.1)
SODIUM: 139 mmol/L (ref 135–145)
TOTAL PROTEIN: 6.3 g/dL — AB (ref 6.5–8.1)
Total Bilirubin: 0.4 mg/dL (ref 0.3–1.2)

## 2017-12-26 LAB — HCG, QUANTITATIVE, PREGNANCY: hCG, Beta Chain, Quant, S: 17327 m[IU]/mL — ABNORMAL HIGH (ref <5)
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
## 2017-12-27 NOTE — ED Notes (Signed)
Pt seen leaving department, has taken armband off. LWBS after triage.

## 2017-12-27 NOTE — ED Notes (Signed)
Pt called x's 3 without response 

## 2018-02-10 LAB — OB RESULTS CONSOLE HIV ANTIBODY (ROUTINE TESTING): HIV: NONREACTIVE

## 2018-02-28 LAB — OB RESULTS CONSOLE GC/CHLAMYDIA
Chlamydia: NEGATIVE
GC PROBE AMP, GENITAL: NEGATIVE

## 2018-03-29 ENCOUNTER — Encounter (HOSPITAL_COMMUNITY): Payer: Self-pay | Admitting: *Deleted

## 2018-03-29 ENCOUNTER — Inpatient Hospital Stay (HOSPITAL_COMMUNITY)
Admission: AD | Admit: 2018-03-29 | Discharge: 2018-03-29 | Disposition: A | Discharge status: Home / Self Care | Payer: Managed Care, Other (non HMO) | Source: Ambulatory Visit | Attending: Obstetrics and Gynecology | Admitting: Obstetrics and Gynecology

## 2018-03-29 DIAGNOSIS — O4703 False labor before 37 completed weeks of gestation, third trimester: Secondary | ICD-10-CM | POA: Insufficient documentation

## 2018-03-29 DIAGNOSIS — Z3A35 35 weeks gestation of pregnancy: Secondary | ICD-10-CM | POA: Diagnosis not present

## 2018-03-29 DIAGNOSIS — F17210 Nicotine dependence, cigarettes, uncomplicated: Secondary | ICD-10-CM | POA: Insufficient documentation

## 2018-03-29 DIAGNOSIS — O99333 Smoking (tobacco) complicating pregnancy, third trimester: Secondary | ICD-10-CM | POA: Insufficient documentation

## 2018-03-29 LAB — URINALYSIS, ROUTINE W REFLEX MICROSCOPIC
Bilirubin Urine: NEGATIVE
GLUCOSE, UA: NEGATIVE mg/dL
Hgb urine dipstick: NEGATIVE
Ketones, ur: NEGATIVE mg/dL
LEUKOCYTES UA: NEGATIVE
Nitrite: NEGATIVE
PH: 6 (ref 5.0–8.0)
PROTEIN: NEGATIVE mg/dL
SPECIFIC GRAVITY, URINE: 1.01 (ref 1.005–1.030)

## 2018-03-29 MED ORDER — NIFEDIPINE 10 MG PO CAPS
10.0000 mg | ORAL_CAPSULE | ORAL | Status: AC
  Administered 2018-03-29 (×3): 10 mg via ORAL
  Filled 2018-03-29 (×3): qty 1

## 2018-03-29 NOTE — MAU Provider Note (Addendum)
History     CSN: [id number]  Arrival date and time: 03/29/18 [id number]   First Provider Initiated Contact with Patient 03/29/18 [phone]     Chief Complaint  Patient presents with  . Contractions  . Vaginal Bleeding  . Pelvic Pain   HPI Kristin Kemp is a 27 y.o. G3P1011 at 6631w6d who presents with contractions. She states the contractions have been every 5 minutes for the last 3 hours and are getting stronger. She rates the pain a 6/10 and has not tried anything for the pain. She reports seeing spotting when she wipes since the contractions started. Denies any leaking of fluid. Reports normal fetal movement.   OB History    Gravida  3   Para  1   Term  1   Preterm      AB  1   Living  1     SAB  1   TAB      Ectopic      Multiple  0   Live Births  1           Past Medical History:  Diagnosis Date  . Abnormal Pap smear   . Allergy   . Anemia   . Herpes   . Late prenatal care   . Ovarian cyst   . PID (pelvic inflammatory disease)   . Urinary tract infection     Past Surgical History:  Procedure Laterality Date  . DILATION AND CURETTAGE OF UTERUS  2009    Family History  Problem Relation Age of Onset  . Hypertension Maternal Grandmother   . Hyperlipidemia Maternal Grandmother   . Heart disease Maternal Grandmother        enlarged heart  . Diabetes Maternal Uncle   . Hypertension Maternal Uncle   . Kidney disease Maternal Uncle   . Cancer Paternal Aunt        lung  . Cancer Paternal Uncle        lung  . Diabetes Maternal Grandfather   . Anesthesia problems Neg Hx     Social History   Tobacco Use  . Smoking status: Current Some Day Smoker    Packs/day: 0.50    Types: Cigarettes    Last attempt to quit: 10/21/2010    Years since quitting: 7.4  . Smokeless tobacco: Current User  Substance Use Topics  . Alcohol use: Not Currently  . Drug use: Yes    Types: Marijuana    Comment: occasionally    Allergies:  Allergies  Allergen  Reactions  . Chocolate Hives and Itching  . Cinnamon Other (See Comments)    Turns tongue raw.  . Latex Other (See Comments)    Reports vaginal sensitivity after latex condom use.    Medications Prior to Admission  Medication Sig Dispense Refill Last Dose  . hydrOXYzine (ATARAX/VISTARIL) 25 MG tablet Take 1 tablet (25 mg total) by mouth every 6 (six) hours. 12 tablet 0   . metroNIDAZOLE (FLAGYL) 500 MG tablet Take 1 tablet (500 mg total) by mouth 2 (two) times daily. 14 tablet 0   . Multiple Vitamin (MULTIVITAMIN WITH MINERALS) TABS tablet Take 1 tablet by mouth daily.   05/25/2016 at Unknown time  . oxyCODONE-acetaminophen (PERCOCET/ROXICET) 5-325 MG tablet Take 1-2 tablets by mouth every 6 (six) hours as needed for severe pain. 12 tablet 0   . simethicone (GAS-X) 80 MG chewable tablet Chew 1 tablet (80 mg total) by mouth every 6 (six) hours as needed for flatulence. (  Patient not taking: Reported on 05/26/2016) 30 tablet 0 Not Taking at Unknown time   Review of Systems  Constitutional: Negative.  Negative for fatigue and fever.  HENT: Negative.   Respiratory: Negative.  Negative for shortness of breath.   Cardiovascular: Negative.  Negative for chest pain.  Gastrointestinal: Positive for abdominal pain. Negative for constipation, diarrhea, nausea and vomiting.  Genitourinary: Positive for vaginal bleeding. Negative for dysuria and vaginal discharge.  Neurological: Negative.  Negative for dizziness and headaches.   Physical Exam   Blood pressure 121/75, pulse (!) 103, temperature 97.7 F (36.5 C), temperature source Oral, resp. rate 17, height 5\' 3"  (1.6 m), weight 74.4 kg, last menstrual period 07/21/2017, SpO2 100 %.  Physical Exam  Nursing note and vitals reviewed. Constitutional: She is oriented to person, place, and time. She appears well-developed and well-nourished. No distress.  HENT:  Head: Normocephalic.  Eyes: Pupils are equal, round, and reactive to light.   Cardiovascular: Normal rate, regular rhythm and normal heart sounds.  Respiratory: Effort normal and breath sounds normal. No respiratory distress.  GI: Soft. Bowel sounds are normal. She exhibits no distension. There is no tenderness.  Genitourinary:  Genitourinary Comments: No blood noted  Neurological: She is alert and oriented to person, place, and time.  Skin: Skin is warm and dry.  Psychiatric: She has a normal mood and affect. Her behavior is normal. Judgment and thought content normal.   Fetal Tracing:  Baseline: 125 Variability: moderate Accels: 15x15 Decels: none  Toco: 2-3  Dilation: Closed Exam by:: Ma Hillock, CNM  Results for orders placed or performed during the hospital encounter of 03/29/18 (from the past 24 hour(s))  Urinalysis, Routine w reflex microscopic     Status: None   Collection Time: 03/29/18  7:34 AM  Result Value Ref Range   Color, Urine YELLOW YELLOW   APPearance CLEAR CLEAR   Specific Gravity, Urine 1.010 1.005 - 1.030   pH 6.0 5.0 - 8.0   Glucose, UA NEGATIVE NEGATIVE mg/dL   Hgb urine dipstick NEGATIVE NEGATIVE   Bilirubin Urine NEGATIVE NEGATIVE   Ketones, ur NEGATIVE NEGATIVE mg/dL   Protein, ur NEGATIVE NEGATIVE mg/dL   Nitrite NEGATIVE NEGATIVE   Leukocytes, UA NEGATIVE NEGATIVE    MAU Course  Procedures   MDM UA PO hydration  Care turned over to H. Payslie Mccaig CNM at 0800. Rolm Bookbinder, CNM 03/29/18 7:58 AM  9:11 AM patient has had a pitcher of water. Still contracting about every 2-3 mins. No cervical change. Patient is a little uncomfortable with contractions. Will try procardia to see if we can calm the uterus down since she isn't making change/in labor at this time.   11:16 AM patient has had 3 doses of procardia. She reports she is no longer feeling contractions. Contractions have spaced considerably on the monitor. No further cervical change. Patient is FT at the ext os, but closed at the internal os (this was what I got  in report from off going CNM, and what I have found x 2 since).   Patient has OB appt tomorrow.   Assessment and Plan   1. False labor before 37 completed weeks of gestation during pregnancy in third trimester, antepartum   2. [redacted] weeks gestation of pregnancy    DC home Comfort measures reviewed  3rd Trimester precautions  PTL precautions  Fetal kick counts RX: none  Return to MAU as needed FU with OB as planned  Follow-up Information    [facility] up.   Contact information: [address]  [address] [place] [address] [phone]

## 2018-03-29 NOTE — Discharge Instructions (Signed)

## 2018-03-29 NOTE — MAU Note (Signed)
Pt reports contractions q 5-7 minutes for the last 3 hours, some blood on tissue, pelvic pressure

## 2018-03-30 LAB — OB RESULTS CONSOLE GBS: GBS: POSITIVE

## 2018-04-09 ENCOUNTER — Inpatient Hospital Stay (HOSPITAL_BASED_OUTPATIENT_CLINIC_OR_DEPARTMENT_OTHER): Payer: Managed Care, Other (non HMO)

## 2018-04-09 ENCOUNTER — Encounter (HOSPITAL_COMMUNITY): Payer: Self-pay

## 2018-04-09 ENCOUNTER — Inpatient Hospital Stay (HOSPITAL_COMMUNITY)
Admission: AD | Admit: 2018-04-09 | Discharge: 2018-04-13 | DRG: 787 | Disposition: A | Discharge status: Home / Self Care | Payer: Managed Care, Other (non HMO) | Attending: Obstetrics and Gynecology | Admitting: Obstetrics and Gynecology

## 2018-04-09 DIAGNOSIS — A6000 Herpesviral infection of urogenital system, unspecified: Secondary | ICD-10-CM | POA: Diagnosis present

## 2018-04-09 DIAGNOSIS — O9832 Other infections with a predominantly sexual mode of transmission complicating childbirth: Secondary | ICD-10-CM | POA: Diagnosis present

## 2018-04-09 DIAGNOSIS — Z189 Retained foreign body fragments, unspecified material: Secondary | ICD-10-CM

## 2018-04-09 DIAGNOSIS — F17210 Nicotine dependence, cigarettes, uncomplicated: Secondary | ICD-10-CM | POA: Diagnosis present

## 2018-04-09 DIAGNOSIS — O99334 Smoking (tobacco) complicating childbirth: Secondary | ICD-10-CM | POA: Diagnosis present

## 2018-04-09 DIAGNOSIS — O368330 Maternal care for abnormalities of the fetal heart rate or rhythm, third trimester, not applicable or unspecified: Secondary | ICD-10-CM

## 2018-04-09 DIAGNOSIS — Z98891 History of uterine scar from previous surgery: Secondary | ICD-10-CM

## 2018-04-09 DIAGNOSIS — Z3A37 37 weeks gestation of pregnancy: Secondary | ICD-10-CM | POA: Diagnosis not present

## 2018-04-09 DIAGNOSIS — O368390 Maternal care for abnormalities of the fetal heart rate or rhythm, unspecified trimester, not applicable or unspecified: Secondary | ICD-10-CM | POA: Diagnosis present

## 2018-04-09 DIAGNOSIS — O99824 Streptococcus B carrier state complicating childbirth: Secondary | ICD-10-CM | POA: Diagnosis present

## 2018-04-09 MED ORDER — OXYTOCIN 40 UNITS IN LACTATED RINGERS INFUSION - SIMPLE MED: 2.5000 [IU]/h | INTRAVENOUS | Status: DC

## 2018-04-09 MED ORDER — LIDOCAINE HCL (PF) 1 % IJ SOLN: 30.0000 mL | INTRAMUSCULAR | Status: DC | PRN

## 2018-04-09 MED ORDER — ACETAMINOPHEN 325 MG PO TABS: 650.0000 mg | ORAL_TABLET | ORAL | Status: DC | PRN

## 2018-04-09 MED ORDER — OXYTOCIN BOLUS FROM INFUSION: 500.0000 mL | Freq: Once | INTRAVENOUS | Status: DC

## 2018-04-09 MED ORDER — SOD CITRATE-CITRIC ACID 500-334 MG/5ML PO SOLN
ORAL | Status: AC
  Administered 2018-04-09: 30 mL via ORAL
  Filled 2018-04-09: qty 15

## 2018-04-09 MED ORDER — ONDANSETRON HCL 4 MG/2ML IJ SOLN: 4.0000 mg | Freq: Four times a day (QID) | INTRAMUSCULAR | Status: DC | PRN

## 2018-04-09 MED ORDER — SODIUM CHLORIDE 0.9 % IV SOLN
5.0000 10*6.[IU] | Freq: Once | INTRAVENOUS | Status: DC
  Filled 2018-04-09: qty 5

## 2018-04-09 MED ORDER — OXYCODONE-ACETAMINOPHEN 5-325 MG PO TABS: 1.0000 | ORAL_TABLET | ORAL | Status: DC | PRN

## 2018-04-09 MED ORDER — PENICILLIN G 3 MILLION UNITS IVPB - SIMPLE MED: 3.0000 10*6.[IU] | INTRAVENOUS | Status: DC

## 2018-04-09 MED ORDER — OXYCODONE-ACETAMINOPHEN 5-325 MG PO TABS: 2.0000 | ORAL_TABLET | ORAL | Status: DC | PRN

## 2018-04-09 MED ORDER — LACTATED RINGERS IV SOLN: 500.0000 mL | INTRAVENOUS | Status: DC | PRN

## 2018-04-09 MED ORDER — LACTATED RINGERS IV SOLN: INTRAVENOUS | Status: DC

## 2018-04-09 MED ORDER — SOD CITRATE-CITRIC ACID 500-334 MG/5ML PO SOLN
30.0000 mL | ORAL | Status: DC | PRN
  Administered 2018-04-09: 30 mL via ORAL

## 2018-04-09 NOTE — Progress Notes (Signed)
Patient ID: Kristin Kemp, female   DOB: Nov 02, 1990, 27 y.o.   MRN: [id number] Pt moved to L&D and FHR began to have recurrent variables with every contraction that did not resolve with position change.    Proceeding with c-section, OR notified. Pt's questions answered.

## 2018-04-09 NOTE — MAU Provider Note (Signed)
S: Ms. Franne FortsLatisha M Hoogendoorn is a 27 y.o. G3P1011 at 5257w3d  who presents to MAU today complaining contractions q 3  minutes since 7 pm. She denies vaginal bleeding. She endorses LOF. She reports normal fetal movement.    O: BP 117/64 (BP Location: Left Arm)   Pulse 85   Temp 97.8 F (36.6 C) (Oral)   Resp 20   Ht 5\' 3"  (1.6 m)   Wt 77.1 kg   LMP 07/21/2017 (Exact Date)   SpO2 100%   BMI 30.11 kg/m  GENERAL: Well-developed, well-nourished female in no acute distress.  HEAD: Normocephalic, atraumatic.  CHEST: Normal effort of breathing, regular heart rate ABDOMEN: Soft, nontender, gravid  Cervical exam:  Dilation: 2.5 Effacement (%): 50 Cervical Position: Posterior Station: -3 Presentation: Vertex Exam by:: Dr. Senaida Oresichardson    Fetal Monitoring: Baseline:  140 Variability: mod Accelerations: present Decelerations: patient having non-recurrent variables after contractions; cervix is now 2-3 cm, not significantly changed.  Contractions: q 2-3   Patient had two variables after contractions; I was immediately at the bedside at 2208. Cervix was checked and was 2 cm; stat bedside BPP was ordered and Dr. Senaida Oresichardson notified, who arrived 10 min later.    A: SIUP at 4257w3d  Non-reassuring fetal heart tracing.   P: Patient to be admitted; Dr. Senaida Oresichardson assumes care of patient.    Marylene LandKooistra, Kathryn Lorraine, CNM 04/09/2018 11:11 PM

## 2018-04-09 NOTE — H&P (Signed)
Kristin Kemp is a 27 y.o. female G3p1011 at 52 3/7 weeks (EDD 04/27/18 by LMP c/w 10 week [place])  Presenting to MAU with contractions and noted to have several significant variable decelerations to the 60's, with subsequent recovery to a Category 1 tracing. Prenatal care significant for smoking both cigarettes and marijuana.  Cut down with pregnancy.  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  She had a psoitve gonorrhea test initially in pregnancy but was treated and then had a negative TOC.  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  OB History    Gravida  3   Para  1   Term  1   Preterm      AB  1   Living  1     SAB  1   TAB      Ectopic      Multiple  0   Live Births  1         2009 SAB 2013 NSVD 6#7oz  Past Medical History:  Diagnosis Date  . Abnormal Pap smear   . Allergy   . Anemia   . Herpes   . Late prenatal care   . Ovarian cyst   . PID (pelvic inflammatory disease)   . Urinary tract infection    Past Surgical History:  Procedure Laterality Date  . DILATION AND CURETTAGE OF UTERUS  2009   Family History: family history includes Cancer in her paternal aunt and paternal uncle; Diabetes in her maternal grandfather and maternal uncle; Heart disease in her maternal grandmother; Hyperlipidemia in her maternal grandmother; Hypertension in her maternal grandmother and maternal uncle; Kidney disease in her maternal uncle. Social History:  reports that she has been smoking cigarettes. She has been smoking about 0.50 packs per day. She uses smokeless tobacco. She reports previous alcohol use. She reports current drug use. Drug: Marijuana.     Maternal Diabetes: No Genetic Screening: Normal Maternal Ultrasounds/Referrals: Normal Fetal Ultrasounds or other Referrals:  None Maternal Substance Abuse:  Yes:  Type: Smoker, Marijuana Significant Maternal Medications:  None Significant Maternal Lab Results:  Lab values include: Group B Strep positive Other Comments:  HSV  positive on suppression, +gonorrhea in pregnancy treated and negative TOC  Review of Systems  Gastrointestinal: Positive for abdominal pain. Negative for nausea.  Neurological: Negative for headaches.   Maternal Medical History:  Reason for admission: Contractions.  Nausea.  Contractions: Onset was 3-5 hours ago.   Perceived severity is moderate.    Fetal activity: Perceived fetal activity is normal.    Prenatal complications: Infection.   Prenatal Complications - Diabetes: none.    Dilation: 2.5 Effacement (%): 50 Station: -3 Exam by:: Dr. Senaida Ores  Blood pressure 117/64, pulse 85, temperature 97.8 F (36.6 C), temperature source Oral, resp. rate 20, height 5\' 3"  (1.6 m), weight 77.1 kg, last menstrual period 07/21/2017, SpO2 100 %. Maternal Exam:  Uterine Assessment: Contraction strength is moderate.  Contraction frequency is regular.   Abdomen: Patient reports no abdominal tenderness. Fetal presentation: vertex  Introitus: Normal vulva. Vulva is negative for lesion.  Normal vagina.    Physical Exam  Constitutional: She appears well-developed.  Cardiovascular: Normal rate and regular rhythm.  Respiratory: Effort normal.  GI: Soft.  Genitourinary:    Vulva and vagina normal.     No vulval lesion noted.   Neurological: She is alert.  Psychiatric: She has a normal mood and affect.    Prenatal labs: ABO, Rh:  A positve  Antibody:  negative Rubella:  Immune RPR:  NR  HBsAg:   Neg HIV:   NR GBS:   positive GC initially positive , treated and negative TOC Chlam negative First trimester screen and AFP negative One hour GCT 118 Essential panel negative   Assessment/Plan: As noted patient had 3-4 significant decels to 50-60 range that appeared variable in nature and then tracing returned to category one tracing.  D/w pt that given she is not even  in active labor yet, that if the decelerations return that she will need a c-section.  We also discussed that even  though she is 37+ weeks, I feel the benefit to delivering her outweighs the early term status of the baby given these decelerations.  She is a smoker of both tobacco and marijuana (cut down to minimal with pregnancy per pt)   We will admit her to birthing suites and follow FHR closely.  She will get PCN for +GBS If FHR remains reassuring, we will attempt labor.  If decelerations become recurrent, we will proceed with c-section.   Risks/benefits/and procedure of c-section d/w pt in detail including bleeding, infection, and possible damage to bowel and bladder. Pt understands procedure should we need to proceed.   [name] 04/09/2018, 11:03 PM

## 2018-04-09 NOTE — MAU Note (Signed)
Pt here with c/o contractions for the last 2 hours. Denies bleeding or leaking. Baby moving well per patient. Was 2 cm on last exam.

## 2018-04-09 NOTE — Anesthesia Preprocedure Evaluation (Addendum)
Anesthesia Evaluation  Patient identified by MRN, date of birth, ID band Patient awake    Reviewed: Allergy & Precautions, NPO status , Patient's Chart, lab work & pertinent test results  Airway Mallampati: II  TM Distance: >3 FB     Dental   Pulmonary Current Smoker,    breath sounds clear to auscultation       Cardiovascular negative cardio ROS   Rhythm:Regular Rate:Normal     Neuro/Psych    GI/Hepatic negative GI ROS, Neg liver ROS,   Endo/Other  negative endocrine ROS  Renal/GU negative Renal ROS     Musculoskeletal   Abdominal   Peds  Hematology negative hematology ROS (+)   Anesthesia Other Findings   Reproductive/Obstetrics (+) Pregnancy                            Anesthesia Physical Anesthesia Plan  ASA: II and emergent  Anesthesia Plan: Spinal   Post-op Pain Management:    Induction:   PONV Risk Score and Plan: 1 and Treatment may vary due to age or medical condition, Ondansetron and Dexamethasone  Airway Management Planned: Natural Airway  Additional Equipment:   Intra-op Plan:   Post-operative Plan:   Informed Consent: I have reviewed the patients History and Physical, chart, labs and discussed the procedure including the risks, benefits and alternatives for the proposed anesthesia with the patient or authorized representative who has indicated his/her understanding and acceptance.     Plan Discussed with: CRNA  Anesthesia Plan Comments:         Anesthesia Quick Evaluation

## 2018-04-10 ENCOUNTER — Inpatient Hospital Stay (HOSPITAL_COMMUNITY): Payer: Managed Care, Other (non HMO)

## 2018-04-10 ENCOUNTER — Encounter (HOSPITAL_COMMUNITY): Payer: Self-pay

## 2018-04-10 ENCOUNTER — Encounter (HOSPITAL_COMMUNITY)
Admission: AD | Disposition: A | Discharge status: Home / Self Care | Payer: Self-pay | Source: Home / Self Care | Attending: Obstetrics and Gynecology

## 2018-04-10 ENCOUNTER — Inpatient Hospital Stay (HOSPITAL_COMMUNITY): Payer: Managed Care, Other (non HMO) | Admitting: Certified Registered Nurse Anesthetist

## 2018-04-10 DIAGNOSIS — Z98891 History of uterine scar from previous surgery: Secondary | ICD-10-CM

## 2018-04-10 LAB — CBC
HCT: 34.6 % — ABNORMAL LOW (ref 36.0–46.0)
HCT: 34.5 % — ABNORMAL LOW (ref 36.0–46.0)
Hemoglobin: 11.9 g/dL — ABNORMAL LOW (ref 12.0–15.0)
Hemoglobin: 11.4 g/dL — ABNORMAL LOW (ref 12.0–15.0)
MCH: 29.2 pg (ref 26.0–34.0)
MCH: 27.9 pg (ref 26.0–34.0)
MCHC: 34.4 g/dL (ref 30.0–36.0)
MCHC: 33 g/dL (ref 30.0–36.0)
MCV: 85 fL (ref 80.0–100.0)
MCV: 84.6 fL (ref 80.0–100.0)
NRBC: 0 % (ref 0.0–0.2)
Platelets: 306 10*3/uL (ref 150–400)
Platelets: 273 10*3/uL (ref 150–400)
RBC: 4.07 MIL/uL (ref 3.87–5.11)
RBC: 4.08 MIL/uL (ref 3.87–5.11)
RDW: 14.3 % (ref 11.5–15.5)
RDW: 14.3 % (ref 11.5–15.5)
WBC: 9.4 10*3/uL (ref 4.0–10.5)
WBC: 16.4 10*3/uL — AB (ref 4.0–10.5)
nRBC: 0 % (ref 0.0–0.2)

## 2018-04-10 LAB — TYPE AND SCREEN
ABO/RH(D): A POS
Antibody Screen: NEGATIVE

## 2018-04-10 LAB — RPR: RPR Ser Ql: NONREACTIVE

## 2018-04-10 SURGERY — Surgical Case
Anesthesia: Spinal

## 2018-04-10 MED ORDER — SIMETHICONE 80 MG PO CHEW: 80.0000 mg | CHEWABLE_TABLET | ORAL | Status: DC | PRN

## 2018-04-10 MED ORDER — NALBUPHINE HCL 10 MG/ML IJ SOLN: 5.0000 mg | Freq: Once | INTRAMUSCULAR | Status: DC | PRN

## 2018-04-10 MED ORDER — NALBUPHINE HCL 10 MG/ML IJ SOLN: 5.0000 mg | INTRAMUSCULAR | Status: DC | PRN

## 2018-04-10 MED ORDER — DIPHENHYDRAMINE HCL 25 MG PO CAPS: 25.0000 mg | ORAL_CAPSULE | Freq: Four times a day (QID) | ORAL | Status: DC | PRN

## 2018-04-10 MED ORDER — FENTANYL CITRATE (PF) 100 MCG/2ML IJ SOLN
25.0000 ug | INTRAMUSCULAR | Status: DC | PRN
  Administered 2018-04-10: 25 ug via INTRAVENOUS

## 2018-04-10 MED ORDER — OXYTOCIN 10 UNIT/ML IJ SOLN
INTRAMUSCULAR | Status: AC
  Filled 2018-04-10: qty 1

## 2018-04-10 MED ORDER — DIPHENHYDRAMINE HCL 50 MG/ML IJ SOLN: 12.5000 mg | INTRAMUSCULAR | Status: DC | PRN

## 2018-04-10 MED ORDER — MORPHINE SULFATE (PF) 0.5 MG/ML IJ SOLN
INTRAMUSCULAR | Status: AC
  Filled 2018-04-10: qty 10

## 2018-04-10 MED ORDER — OXYTOCIN 10 UNIT/ML IJ SOLN
INTRAVENOUS | Status: DC | PRN
  Administered 2018-04-10: 40 [IU] via INTRAVENOUS

## 2018-04-10 MED ORDER — ZOLPIDEM TARTRATE 5 MG PO TABS: 5.0000 mg | ORAL_TABLET | Freq: Every evening | ORAL | Status: DC | PRN

## 2018-04-10 MED ORDER — FENTANYL CITRATE (PF) 100 MCG/2ML IJ SOLN
INTRAMUSCULAR | Status: DC | PRN
  Administered 2018-04-10: 15 ug via INTRATHECAL

## 2018-04-10 MED ORDER — SIMETHICONE 80 MG PO CHEW
80.0000 mg | CHEWABLE_TABLET | Freq: Three times a day (TID) | ORAL | Status: DC
  Administered 2018-04-10 – 2018-04-13 (×10): 80 mg via ORAL
  Filled 2018-04-10 (×10): qty 1

## 2018-04-10 MED ORDER — FENTANYL CITRATE (PF) 100 MCG/2ML IJ SOLN
INTRAMUSCULAR | Status: AC
  Filled 2018-04-10: qty 2

## 2018-04-10 MED ORDER — KETOROLAC TROMETHAMINE 30 MG/ML IJ SOLN
INTRAMUSCULAR | Status: AC
  Filled 2018-04-10: qty 1

## 2018-04-10 MED ORDER — DIBUCAINE 1 % RE OINT: 1.0000 "application " | TOPICAL_OINTMENT | RECTAL | Status: DC | PRN

## 2018-04-10 MED ORDER — SODIUM CHLORIDE 0.9 % IR SOLN
Status: DC | PRN
  Administered 2018-04-10: 1000 mL

## 2018-04-10 MED ORDER — NALOXONE HCL 0.4 MG/ML IJ SOLN: 0.4000 mg | INTRAMUSCULAR | Status: DC | PRN

## 2018-04-10 MED ORDER — LACTATED RINGERS IV SOLN: INTRAVENOUS | Status: DC

## 2018-04-10 MED ORDER — OXYTOCIN 40 UNITS IN LACTATED RINGERS INFUSION - SIMPLE MED: 2.5000 [IU]/h | INTRAVENOUS | Status: AC

## 2018-04-10 MED ORDER — SCOPOLAMINE 1 MG/3DAYS TD PT72
MEDICATED_PATCH | TRANSDERMAL | Status: AC
  Filled 2018-04-10: qty 1

## 2018-04-10 MED ORDER — OXYCODONE HCL 5 MG PO TABS
5.0000 mg | ORAL_TABLET | ORAL | Status: DC | PRN
  Administered 2018-04-11 – 2018-04-12 (×2): 5 mg via ORAL
  Filled 2018-04-10 (×2): qty 1

## 2018-04-10 MED ORDER — LACTATED RINGERS IV SOLN
INTRAVENOUS | Status: DC | PRN
  Administered 2018-04-10: 01:00:00 via INTRAVENOUS

## 2018-04-10 MED ORDER — LACTATED RINGERS IV SOLN
INTRAVENOUS | Status: DC | PRN
  Administered 2018-04-10: via INTRAVENOUS

## 2018-04-10 MED ORDER — KETOROLAC TROMETHAMINE 30 MG/ML IJ SOLN
30.0000 mg | Freq: Four times a day (QID) | INTRAMUSCULAR | Status: AC | PRN
  Administered 2018-04-10: 30 mg via INTRAMUSCULAR

## 2018-04-10 MED ORDER — NALOXONE HCL 4 MG/10ML IJ SOLN
1.0000 ug/kg/h | INTRAVENOUS | Status: DC | PRN
  Filled 2018-04-10: qty 5

## 2018-04-10 MED ORDER — SODIUM CHLORIDE 0.9% FLUSH: 3.0000 mL | INTRAVENOUS | Status: DC | PRN

## 2018-04-10 MED ORDER — COCONUT OIL OIL
1.0000 "application " | TOPICAL_OIL | Status: DC | PRN
  Administered 2018-04-12: 1 via TOPICAL
  Filled 2018-04-10: qty 120

## 2018-04-10 MED ORDER — IBUPROFEN 800 MG PO TABS
800.0000 mg | ORAL_TABLET | Freq: Three times a day (TID) | ORAL | Status: AC
  Administered 2018-04-10 – 2018-04-12 (×8): 800 mg via ORAL
  Filled 2018-04-10 (×8): qty 1

## 2018-04-10 MED ORDER — TETANUS-DIPHTH-ACELL PERTUSSIS 5-2.5-18.5 LF-MCG/0.5 IM SUSP: 0.5000 mL | Freq: Once | INTRAMUSCULAR | Status: DC

## 2018-04-10 MED ORDER — SIMETHICONE 80 MG PO CHEW
80.0000 mg | CHEWABLE_TABLET | ORAL | Status: DC
  Administered 2018-04-10 – 2018-04-12 (×3): 80 mg via ORAL
  Filled 2018-04-10 (×3): qty 1

## 2018-04-10 MED ORDER — PHENYLEPHRINE 8 MG IN D5W 100 ML (0.08MG/ML) PREMIX OPTIME
INJECTION | INTRAVENOUS | Status: AC
  Filled 2018-04-10: qty 100

## 2018-04-10 MED ORDER — SCOPOLAMINE 1 MG/3DAYS TD PT72
MEDICATED_PATCH | TRANSDERMAL | Status: DC | PRN
  Administered 2018-04-10: 1 via TRANSDERMAL

## 2018-04-10 MED ORDER — MENTHOL 3 MG MT LOZG: 1.0000 | LOZENGE | OROMUCOSAL | Status: DC | PRN

## 2018-04-10 MED ORDER — CEFAZOLIN SODIUM-DEXTROSE 2-3 GM-%(50ML) IV SOLR
INTRAVENOUS | Status: DC | PRN
  Administered 2018-04-10: 2 g via INTRAVENOUS

## 2018-04-10 MED ORDER — SENNOSIDES-DOCUSATE SODIUM 8.6-50 MG PO TABS
2.0000 | ORAL_TABLET | ORAL | Status: DC
  Administered 2018-04-10 – 2018-04-12 (×3): 2 via ORAL
  Filled 2018-04-10 (×3): qty 2

## 2018-04-10 MED ORDER — DEXAMETHASONE SODIUM PHOSPHATE 4 MG/ML IJ SOLN
INTRAMUSCULAR | Status: AC
  Filled 2018-04-10: qty 1

## 2018-04-10 MED ORDER — FENTANYL CITRATE (PF) 100 MCG/2ML IJ SOLN
INTRAMUSCULAR | Status: AC
  Administered 2018-04-10: 25 ug via INTRAVENOUS
  Filled 2018-04-10: qty 2

## 2018-04-10 MED ORDER — ONDANSETRON HCL 4 MG/2ML IJ SOLN
INTRAMUSCULAR | Status: DC | PRN
  Administered 2018-04-10: 4 mg via INTRAVENOUS

## 2018-04-10 MED ORDER — WITCH HAZEL-GLYCERIN EX PADS: 1.0000 "application " | MEDICATED_PAD | CUTANEOUS | Status: DC | PRN

## 2018-04-10 MED ORDER — SCOPOLAMINE 1 MG/3DAYS TD PT72: 1.0000 | MEDICATED_PATCH | Freq: Once | TRANSDERMAL | Status: DC

## 2018-04-10 MED ORDER — ONDANSETRON HCL 4 MG/2ML IJ SOLN: 4.0000 mg | Freq: Three times a day (TID) | INTRAMUSCULAR | Status: DC | PRN

## 2018-04-10 MED ORDER — CEFAZOLIN SODIUM-DEXTROSE 2-4 GM/100ML-% IV SOLN
INTRAVENOUS | Status: AC
  Filled 2018-04-10: qty 100

## 2018-04-10 MED ORDER — MEPERIDINE HCL 25 MG/ML IJ SOLN: 6.2500 mg | INTRAMUSCULAR | Status: DC | PRN

## 2018-04-10 MED ORDER — ACETAMINOPHEN 500 MG PO TABS
1000.0000 mg | ORAL_TABLET | Freq: Four times a day (QID) | ORAL | Status: AC
  Administered 2018-04-10 (×3): 1000 mg via ORAL
  Filled 2018-04-10 (×3): qty 2

## 2018-04-10 MED ORDER — PHENYLEPHRINE 8 MG IN D5W 100 ML (0.08MG/ML) PREMIX OPTIME
INJECTION | INTRAVENOUS | Status: DC | PRN
  Administered 2018-04-10: 60 ug/min via INTRAVENOUS

## 2018-04-10 MED ORDER — ONDANSETRON HCL 4 MG/2ML IJ SOLN
INTRAMUSCULAR | Status: AC
  Filled 2018-04-10: qty 2

## 2018-04-10 MED ORDER — PRENATAL MULTIVITAMIN CH
1.0000 | ORAL_TABLET | Freq: Every day | ORAL | Status: DC
  Administered 2018-04-10 – 2018-04-12 (×3): 1 via ORAL
  Filled 2018-04-10 (×3): qty 1

## 2018-04-10 MED ORDER — BUPIVACAINE IN DEXTROSE 0.75-8.25 % IT SOLN
INTRATHECAL | Status: DC | PRN
  Administered 2018-04-10: 1.6 mL via INTRATHECAL

## 2018-04-10 MED ORDER — DIPHENHYDRAMINE HCL 25 MG PO CAPS
25.0000 mg | ORAL_CAPSULE | ORAL | Status: DC | PRN
  Filled 2018-04-10: qty 1

## 2018-04-10 MED ORDER — DEXAMETHASONE SODIUM PHOSPHATE 4 MG/ML IJ SOLN
INTRAMUSCULAR | Status: DC | PRN
  Administered 2018-04-10: 4 mg via INTRAVENOUS

## 2018-04-10 MED ORDER — MORPHINE SULFATE (PF) 0.5 MG/ML IJ SOLN
INTRAMUSCULAR | Status: DC | PRN
  Administered 2018-04-10: .15 mg via INTRATHECAL

## 2018-04-10 MED ORDER — KETOROLAC TROMETHAMINE 30 MG/ML IJ SOLN: 30.0000 mg | Freq: Four times a day (QID) | INTRAMUSCULAR | Status: AC | PRN

## 2018-04-10 SURGICAL SUPPLY — 38 items
APL SKNCLS STERI-STRIP NONHPOA (GAUZE/BANDAGES/DRESSINGS) ×1
BENZOIN TINCTURE PRP APPL 2/3 (GAUZE/BANDAGES/DRESSINGS) ×2 IMPLANT
CHLORAPREP W/TINT 26ML (MISCELLANEOUS) ×3 IMPLANT
CLAMP CORD UMBIL (MISCELLANEOUS) IMPLANT
CLOSURE STERI STRIP 1/2 X4 (GAUZE/BANDAGES/DRESSINGS) ×2 IMPLANT
CLOSURE WOUND 1/2 X4 (GAUZE/BANDAGES/DRESSINGS)
CLOTH BEACON ORANGE TIMEOUT ST (SAFETY) ×3 IMPLANT
DRSG OPSITE POSTOP 4X10 (GAUZE/BANDAGES/DRESSINGS) ×3 IMPLANT
ELECT REM PT RETURN 9FT ADLT (ELECTROSURGICAL) ×3
ELECTRODE REM PT RTRN 9FT ADLT (ELECTROSURGICAL) ×1 IMPLANT
EXTRACTOR VACUUM KIWI (MISCELLANEOUS) IMPLANT
GLOVE BIO SURGEON STRL SZ 6.5 (GLOVE) ×2 IMPLANT
GLOVE BIO SURGEONS STRL SZ 6.5 (GLOVE) ×1
GLOVE BIOGEL PI IND STRL 7.0 (GLOVE) ×1 IMPLANT
GLOVE BIOGEL PI INDICATOR 7.0 (GLOVE) ×2
GOWN STRL REUS W/TWL LRG LVL3 (GOWN DISPOSABLE) ×6 IMPLANT
KIT ABG SYR 3ML LUER SLIP (SYRINGE) IMPLANT
NDL HYPO 25X5/8 SAFETYGLIDE (NEEDLE) IMPLANT
NEEDLE HYPO 25X5/8 SAFETYGLIDE (NEEDLE) IMPLANT
NS IRRIG 1000ML POUR BTL (IV SOLUTION) ×3 IMPLANT
PACK C SECTION WH (CUSTOM PROCEDURE TRAY) ×3 IMPLANT
PAD OB MATERNITY 4.3X12.25 (PERSONAL CARE ITEMS) ×3 IMPLANT
PENCIL SMOKE EVAC W/HOLSTER (ELECTROSURGICAL) ×3 IMPLANT
RTRCTR C-SECT PINK 25CM LRG (MISCELLANEOUS) ×3 IMPLANT
SPONGE LAP 18X18 RF (DISPOSABLE) ×9 IMPLANT
STRIP CLOSURE SKIN 1/2X4 (GAUZE/BANDAGES/DRESSINGS) IMPLANT
SUT CHROMIC 1 CTX 36 (SUTURE) ×6 IMPLANT
SUT PLAIN 0 NONE (SUTURE) IMPLANT
SUT PLAIN 2 0 XLH (SUTURE) ×3 IMPLANT
SUT VIC AB 0 CT1 27 (SUTURE) ×6
SUT VIC AB 0 CT1 27XBRD ANBCTR (SUTURE) ×2 IMPLANT
SUT VIC AB 2-0 CT1 27 (SUTURE) ×3
SUT VIC AB 2-0 CT1 TAPERPNT 27 (SUTURE) ×1 IMPLANT
SUT VIC AB 3-0 CT1 27 (SUTURE)
SUT VIC AB 3-0 CT1 TAPERPNT 27 (SUTURE) IMPLANT
SUT VIC AB 4-0 KS 27 (SUTURE) ×3 IMPLANT
TOWEL OR 17X24 6PK STRL BLUE (TOWEL DISPOSABLE) ×3 IMPLANT
TRAY FOLEY W/BAG SLVR 14FR LF (SET/KITS/TRAYS/PACK) ×3 IMPLANT

## 2018-04-10 NOTE — Lactation Note (Addendum)
This note was copied from a baby's chart. Lactation Consultation Note  Patient Name: Kristin Kemp [id number]'s Date: 04/10/2018 Reason for consult: Initial assessment;Early term 37-38.6wks;NICU baby(LC encouraged mom to ask when she can latch and placed baby STS ) Baby is 2118 hours old  As LC entered the room 125 mom resting in bed and visiting with her family.  LC asked for permission to talk in front of her family and mom said ok .  Per mom has held the baby but not STS . LC encouraged and ask when baby can latch at the breast.  LC reviewed supply and demand/ importance of consistent pumping around the clock to establish and protect  Milk supply. LC recommended pumping 8-10 times in 24 hours both breast for 15 -20 mins.  Per mom will have a DEBP Ameda at home.  DEBP already set up and mom has been pumping with small results. LC provided colostrum collectors  To transport her milk up to NICU. [facility] to provide labels for her colostrum containers.  Mother informed of post-discharge support and given phone number to the lactation department, including services for phone call assistance; out-patient appointments; and breastfeeding support group. List of other breastfeeding resources in the community given in the handout. Encouraged mother to call for problems or concerns related to breastfeeding.  Maternal Data    Feeding Feeding Type: Formula Nipple Type: Slow - flow  LATCH Score                   Interventions Interventions: Breast feeding basics reviewed;DEBP  Lactation Tools Discussed/Used Tools: Pump;Flanges Flange Size: 24(per mom comfortable ) Breast pump type: Double-Electric Breast Pump WIC Program: No Pump Review: Milk Storage(NICU and the guidelines in mother and baby care booklet )   Consult Status Consult Status: Follow-up Date: 04/11/18 Follow-up type: In-patient    Kristin Kemp 04/10/2018, 6:38 PM

## 2018-04-10 NOTE — Anesthesia Procedure Notes (Addendum)
Spinal  Start time: 04/10/2018 12:13 AM End time: 04/10/2018 12:17 AM Staffing Anesthesiologist: Marcene DuosFitzgerald, Alvin Rubano, MD Performed: anesthesiologist  Preanesthetic Checklist Completed: patient identified, site marked, surgical consent, pre-op evaluation, timeout performed, IV checked, risks and benefits discussed and monitors and equipment checked Spinal Block Patient position: sitting Prep: site prepped and draped and DuraPrep Patient monitoring: blood pressure, continuous pulse ox and heart rate Approach: midline Location: L4-5 Injection technique: single-shot Needle Needle type: Pencan  Needle gauge: 24 G Needle length: 9 cm Assessment Sensory level: T4

## 2018-04-10 NOTE — Progress Notes (Signed)
Subjective: Postpartum Day 0: Cesarean Delivery Patient reports incisional pain and tolerating PO.  Baby in NICU  Objective: Vital signs in last 24 hours: Temp:  [97.2 F (36.2 C)-98.8 F (37.1 C)] 97.7 F (36.5 C) (12/16 0630) Pulse Rate:  [69-94] 74 (12/16 0630) Resp:  [16-24] 18 (12/16 0630) BP: (111-129)/(64-88) 111/72 (12/16 0630) SpO2:  [96 %-100 %] 96 % (12/16 0630) Weight:  [77.1 kg] 77.1 kg (12/15 2341)  Physical Exam:  General: alert Lochia: appropriate Uterine Fundus: firm Incision: dressing C/D/I   Recent Labs    04/09/18 2221 04/10/18 0535  HGB 11.9* 11.4*  HCT 34.6* 34.5*    Assessment/Plan: Status post Cesarean section. Doing well postoperatively.  Continue current care, ambulate.  Leighton Roachodd D Kiera Hussey 04/10/2018, 8:14 AM

## 2018-04-10 NOTE — Op Note (Addendum)
Operative Note    Preoperative Diagnosis Term pregnancy at 37 3/7 weeks Repetitive fetal heart rate decelerations remote from delivery  Postoperative Diagnosis Same with an occult cord by the head and malodorous amniotic fluid  Procedure Primary low transverse c-section with 2 layer closure of uterus  Surgeon Huel Cote, MD  Anesthesia Spinal  Fluids: EBL UOP clear IVF LR   Findings A viable female infant in the vertex presentation with occult cord by the head.  Intially cried, but had poor tone and handed off to NICU after delayed cord clamping.  Cord pH 7.39.  Apgars 7,9,9  and weight per NICU.  Amniotic was clear but malodorous so placenta sent to pathology and placental culture done.  Uterus tubes and ovaries appear WNL  Specimen Placenta to pathology  Procedure Note   Patient was taken to the operating room where spinal anesthesia was obtained while the fetal heart rate auscultated.  It was found to be adequate by Allis clamp test. She was prepped and draped in the normal sterile fashion in the dorsal supine position with a leftward tilt. An appropriate time out was performed. A Pfannenstiel skin incision was then made with the scalpel and carried through to the underlying layer of fascia by sharp dissection and Bovie cautery. The fascia was nicked in the midline and the incision was extended laterally with Mayo scissors. The inferior aspect of the incision was grasped Coker clamps and dissected off the underlying rectus muscles. In a similar fashion the superior aspect was dissected off the rectus muscles. Rectus muscles were separated in the midline and the peritoneal cavity entered bluntly. The peritoneal incision was then extended both superiorly and inferiorly with careful attention to avoid both bowel and bladder. The Alexis self-retaining wound retractor was then placed within the incision and the lower uterine segment exposed. The bladder flap was  developed with Metzenbaum scissors and pushed away from the lower uterine segment. The lower uterine segment was then incised in a transverse fashion and the cavity itself entered bluntly. The incision was extended bluntly. The infant's head was then lifted and delivered from the incision without difficulty. The remainder of the infant delivered and the nose and mouth bulb suctioned with the cord clamped and cut as well. The infant was handed off to the waiting pediatricians. The placenta was then spontaneously expressed from the uterus and the uterus cleared of all clots and debris with moist lap sponge. The uterine incision was then repaired in 2 layers the first layer was a running locked layer 1-0 chromic and the second an imbricating layer of the same suture. The tubes and ovaries were inspected and the gutters cleared of all clots and debris. The uterine incision was inspected and found to be hemostatic. All instruments and sponges as well as the Alexis retractor were then removed from the abdomen. The rectus muscles and peritoneum were then reapproximated with a running suture of 2-0 Vicryl. The fascia was then closed with 0 Vicryl in a running fashion. Subcutaneous tissue was reapproximated with 3-0 plain in a running fashion. The skin was closed with a subcuticular stitch of 4-0 Vicryl on a Keith needle and then reinforced with benzoin and Steri-Strips. At the conclusion of the procedure all instruments and sponge counts were not correct as there was an extra needle on the field.  Per protocol an Xray was performed although it was felt that it was just missed in the primary count as it represented a surplus and  not a deficit. . Patient was taken to the recovery room.  Baby was having increased respiratory effort, so was taken to NICU for observation.

## 2018-04-10 NOTE — Transfer of Care (Signed)
Immediate Anesthesia Transfer of Care Note  Patient: Kristin Kemp  Procedure(s) Performed: CESAREAN SECTION (N/A )  Patient Location: PACU  Anesthesia Type:Spinal  Level of Consciousness: awake, alert  and patient cooperative  Airway & Oxygen Therapy: Patient Spontanous Breathing  Post-op Assessment: Report given to RN and Post -op Vital signs reviewed and stable  Post vital signs: Reviewed and stable  Last Vitals:  Vitals Value Taken Time  BP    Temp    Pulse    Resp    SpO2      Last Pain:  Vitals:   04/09/18 2344  TempSrc:   PainSc: 10-Worst pain ever         Complications: No apparent anesthesia complications

## 2018-04-10 NOTE — Anesthesia Postprocedure Evaluation (Signed)
Anesthesia Post Note  Patient: Kristin Kemp  Procedure(s) Performed: CESAREAN SECTION (N/A )     Patient location during evaluation: Mother Baby Anesthesia Type: Spinal Level of consciousness: awake and alert and oriented Pain management: satisfactory to patient Vital Signs Assessment: post-procedure vital signs reviewed and stable Respiratory status: respiratory function stable and spontaneous breathing Cardiovascular status: blood pressure returned to baseline Postop Assessment: no headache, no backache, spinal receding, patient able to bend at knees and adequate PO intake Anesthetic complications: no    Last Vitals:  Vitals:   04/10/18 0530 04/10/18 0630  BP: 122/64 111/72  Pulse: 69 74  Resp: 16 18  Temp: 36.5 C 36.5 C  SpO2: 97% 96%    Last Pain:  Vitals:   04/10/18 0710  TempSrc:   PainSc: 0-No pain   Pain Goal:                 [name]

## 2018-04-11 ENCOUNTER — Other Ambulatory Visit: Payer: Self-pay

## 2018-04-11 NOTE — Progress Notes (Deleted)
Post Partum Day 1 Subjective: no complaints, up ad lib, voiding, tolerating PO and nl lochia, pain controlled  Objective: Blood pressure 122/64, pulse 75, temperature 100.2 F (37.9 C), temperature source Oral, resp. rate 16, height 5\' 3"  (1.6 m), weight 77.1 kg, last menstrual period 07/21/2017, SpO2 100 %, unknown if currently breastfeeding.  Physical Exam:  General: alert and no distress Lochia: appropriate Uterine Fundus: firm Incision: healing well DVT Evaluation: No evidence of DVT seen on physical exam.  Recent Labs    04/09/18 2221 04/10/18 0535  HGB 11.9* 11.4*  HCT 34.6* 34.5*    Assessment/Plan: Routine PP care Breastfeeding and Lactation consult Baby in NICU - eating well, some spitting   LOS: 2 days   Kristin Kemp 04/11/2018, 7:26 AM

## 2018-04-11 NOTE — Progress Notes (Signed)
Subjective: Postpartum Day 1: Cesarean Delivery Patient reports incisional pain and tolerating PO.  Nl lochia, pain controlled  Objective: Vital signs in last 24 hours: Temp:  [97.5 F (36.4 C)-100.2 F (37.9 C)] 100.2 F (37.9 C) (12/17 0600) Pulse Rate:  [59-75] 75 (12/17 0600) Resp:  [16-18] 16 (12/17 0600) BP: (114-122)/(64-82) 122/64 (12/17 0600) SpO2:  [100 %] 100 % (12/17 0600)  Physical Exam:  General: alert and no distress Lochia: appropriate Uterine Fundus: firm Incision: healing well DVT Evaluation: No evidence of DVT seen on physical exam.  Recent Labs    04/09/18 2221 04/10/18 0535  HGB 11.9* 11.4*  HCT 34.6* 34.5*    Assessment/Plan: Status post Cesarean section. Doing well postoperatively.  Continue current care. Baby in NICU - RA, some spitting  Kavitha Lansdale Bovard-Stuckert 04/11/2018, 7:36 AM

## 2018-04-12 NOTE — Discharge Summary (Signed)
OB Discharge Summary     Patient Name: Kristin Kemp DOB: 14-Jul-1990 MRN: [id number]  Date of admission: 04/09/2018 Delivering MD: Huel CoteICHARDSON, Hazen Brumett   Date of discharge: 04/13/2018  Admitting diagnosis: 37 WKS, CTXS Intrauterine pregnancy: 3180w4d     Secondary diagnosis:  Active Problems:   Fetal heart rate decelerations affecting management of mother   S/P primary low transverse C-section  Additional problems: none     Discharge diagnosis: Term Pregnancy Delivered                                                                                                Post partum procedures:none   Complications: None  Hospital course:  Onset of Labor With Unplanned C/S  27 y.o. yo [id number] at 3380w4d was admitted in Latent Labor on 04/09/2018. Patient had a labor course significant for contractions with spontaneous repetitive decelerations of FHR. Membrane Rupture Time/Date: 12:33 AM ,04/10/2018   The patient went for cesarean section due to recurrent fetal heart rate decelerations remote from delivery., and delivered a Viable infant,04/10/2018  Details of operation can be found in separate operative note. Patient had an uncomplicated postpartum course.  She is ambulating,tolerating a regular diet, passing flatus, and urinating well.  Patient is discharged home in stable condition 04/13/18.  Physical exam  Vitals:   04/11/18 2335 04/12/18 0607 04/12/18 1350 04/12/18 2309  BP: 118/79 133/88 124/88 138/86  Pulse: 88 77 74 64  Resp: 18 18 17 17   Temp: 98.1 F (36.7 C) (!) 97.5 F (36.4 C) 98.6 F (37 C) (!) 97.4 F (36.3 C)  TempSrc: Oral Oral Oral Oral  SpO2:   100%   Weight:      Height:       General: alert and cooperative Lochia: appropriate Uterine Fundus: firm Incision: Dressing is clean, dry, and intact  Labs: Lab Results  Component Value Date   WBC 16.4 (H) 04/10/2018   HGB 11.4 (L) 04/10/2018   HCT 34.5 (L) 04/10/2018   MCV 84.6 04/10/2018   PLT 273  04/10/2018   CMP Latest Ref Rng & Units 12/26/2017  Glucose 70 - 99 mg/dL 86  BUN 6 - 20 mg/dL 6  Creatinine 1.470.44 - 8.291.00 mg/dL 5.620.68  Sodium 130135 - 865145 mmol/L 139  Potassium 3.5 - 5.1 mmol/L 3.8  Chloride 98 - 111 mmol/L 110  CO2 22 - 32 mmol/L 20(L)  Calcium 8.9 - 10.3 mg/dL 9.3  Total Protein 6.5 - 8.1 g/dL 6.3(L)  Total Bilirubin 0.3 - 1.2 mg/dL 0.4  Alkaline Phos 38 - 126 U/L 44  AST 15 - 41 U/L 32  ALT 0 - 44 U/L 41    Discharge instruction: per After Visit Summary and "Baby and Me Booklet".  After visit meds:  Allergies as of 04/13/2018      Reactions   Chocolate Hives, Itching   Cinnamon Other (See Comments)   Turns tongue raw.   Latex Other (See Comments)   Reports vaginal sensitivity after latex condom use.      Medication List    STOP taking these medications   BONJESTA  20-20 MG Tbcr Generic drug:  Doxylamine-Pyridoxine ER   valACYclovir 500 MG tablet Commonly known as:  VALTREX     TAKE these medications   ibuprofen 800 MG tablet Commonly known as:  ADVIL,MOTRIN Take 1 tablet (800 mg total) by mouth every 8 (eight) hours.   IRON PO Take 1 tablet by mouth daily.   oxyCODONE 5 MG immediate release tablet Commonly known as:  Oxy IR/ROXICODONE Take 1-2 tablets (5-10 mg total) by mouth every 4 (four) hours as needed for moderate pain.   prenatal multivitamin Tabs tablet Take 1 tablet by mouth daily at 12 noon.       Diet: routine diet  Activity: Advance as tolerated. Pelvic rest for 6 weeks.   Outpatient follow up:2 weeks Follow up Appt:No future appointments. Follow up Visit:No follow-ups on file.  Postpartum contraception: Undecided  Newborn Data: Live born female  Birth Weight: 6 lb 11.9 oz (3060 g) APGAR: 7, 9  Newborn Delivery   Birth date/time:  04/10/2018 00:34:00 Delivery type:  C-Section, Low Transverse Trial of labor:  No C-section categorization:  Primary     Baby Feeding: Bottle and Breast Disposition:home with  mother   04/13/2018 Oliver Pila, MD

## 2018-04-12 NOTE — Clinical Social Work Maternal (Signed)
CLINICAL SOCIAL WORK MATERNAL/CHILD NOTE  Patient Details  Name: Franne FortsLatisha M Mantia MRN: [id number] Date of Birth: 01-17-1991  Date:  04/12/2018  Clinical Social Worker Initiating Note:  Celso SickleKimberly Shaquilla Kehres, [place] Date/Time: Initiated:  04/12/18/1316     Child's Name:  Gala Lewandowskyhase Brown    Biological Parents:  Mother, Father(Father - Gwendlyn DeutscherWilliam Brown (09-04-76))   Need for Interpreter:  None   Reason for Referral:  Current Substance Use/Substance Use During Pregnancy    Address:  [address] [place] [id number]    Phone number:  [phone] (home)     Additional phone number:   Household Members/Support Persons (HM/SP):   Household Member/Support Person 1   HM/SP Name Relationship DOB or Age  HM/SP -1 Skylar Schaus daughter 06-30-11  HM/SP -2        HM/SP -3        HM/SP -4        HM/SP -5        HM/SP -6        HM/SP -7        HM/SP -8          Natural Supports (not living in the home):  Parent, Extended Family   Professional Supports: None   Employment: Full-time   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    Education:  Some Materials engineerCollege   Homebound arranged:    Surveyor, quantityinancial Resources:  O[employer] EnergyMedicaid, Media plannerrivate Insurance   Other Resources:  Sales executiveood Stamps    Cultural/Religious Considerations Which May Impact Care:    Strengths:  Ability to meet basic needs , Home prepared for child , Pediatrician chosen   Psychotropic Medications:         [place]  Pediatrician List:   [place] [facility]  [place]    [place]    [place] [facility]    [place]    [place]      Pediatrician Fax Number:    Risk Factors/Current Problems:  Substance Use    Cognitive State:  Able to Concentrate , Alert , Linear Thinking    Mood/Affect:  Interested , Calm    CSW Assessment: CSW spoke with MOB at bedside regarding substance use during pregnancy. MOB was holding infant and appeared to be bonding well.  MOB was welcoming and engaged throughout assessment. MOB reported that she resides with her older daughter and that FOB is involved. MOB reported that she has everything she needs for the baby and that her support system includes her sister, dad and brother. CSW inquired about MOB's mental health history, MOB denied any mental health history and denied history of post partum depression with first child. MOB presented calm and did not demonstrate any acute mental health signs/symptoms. CSW assessed for safety, MOB denied SI, HI and domestic violence.   CSW provided education regarding the baby blues period vs. perinatal mood disorders, discussed treatment and gave resources for mental health follow up if concerns arise.  CSW recommends self-evaluation during the postpartum time period using the New Mom Checklist from Postpartum Progress and encouraged MOB to contact a medical professional if symptoms are noted at any time.    CSW provided review of Sudden Infant Death Syndrome (SIDS) precautions.    CSW informed MOB about hospital drug policy and inquired about MOB's substance use during pregnancy. MOB reported that she smoked marijuana in the beginning of her pregnancy to help with nausea. MOB reported that her last use was around 3 months  of pregnancy. CSW informed MOB that infant's UDS was negative and that CSW would continue to monitor CDS. CSW explained that a CPS report would be made if warranted, MOB verbalized understanding.   CSW identifies no further need for intervention and no barriers to discharge at this time.  CSW Plan/Description:  No Further Intervention Required/No Barriers to Discharge, Sudden Infant Death Syndrome (SIDS) Education, Perinatal Mood and Anxiety Disorder (PMADs) Education, Hospital Drug Screen Policy Information, CSW Will Continue to Monitor Umbilical Cord Tissue Drug Screen Results and Make Report if Jamelle Rushing, LCSW 04/12/2018, 1:19 PM

## 2018-04-12 NOTE — Progress Notes (Signed)
Patient ID: Kristin Kemp, female   DOB: 07/19/1990, 27 y.o.   MRN: [id number] Pt doing well with no complaints. Baby out of NICU and pt bonding well with him. Pumping and getting a moderate amount of colostrum. She reports pain well controlled. Ambulating and tolerating diet well. She denies fever, chills, SOB or CP. VSS ABD - FF and dressing c/d/i EXT - +2 edema , no homans  A/P: POD#2 s/p pc/s for NRFHT - stable         Routine pp/post op care         Ted stockings

## 2018-04-12 NOTE — Plan of Care (Signed)
Progressing appropriately. Encouraged to call for assistance as needed, and for LATCH assessment.  

## 2018-04-13 MED ORDER — IBUPROFEN 800 MG PO TABS: 800.0000 mg | ORAL_TABLET | Freq: Three times a day (TID) | ORAL | 0 refills | Status: DC

## 2018-04-13 MED ORDER — OXYCODONE HCL 5 MG PO TABS: 5.0000 mg | ORAL_TABLET | ORAL | 0 refills | Status: DC | PRN

## 2018-04-13 NOTE — Progress Notes (Signed)
Subjective: Postpartum Day 3: Cesarean Delivery Patient reports tolerating PO, + flatus and no problems voiding.    Objective: Vital signs in last 24 hours: Temp:  [97.4 F (36.3 C)-98.6 F (37 C)] 97.4 F (36.3 C) (12/18 2309) Pulse Rate:  [64-74] 64 (12/18 2309) Resp:  [17] 17 (12/18 2309) BP: (124-138)/(86-88) 138/86 (12/18 2309) SpO2:  [100 %] 100 % (12/18 1350)  Physical Exam:  General: alert and cooperative Lochia: appropriate Uterine Fundus: firm Incision: C/D/I   No results for input(s): HGB, HCT in the last 72 hours.  Assessment/Plan: Status post Cesarean section. Doing well postoperatively.  Discharge home with standard precautions and return to clinic in 2 weeks.  Oliver PilaKathy W Zayvier Caravello 04/13/2018, 8:18 AM

## 2018-04-13 NOTE — Lactation Note (Signed)
This note was copied from a baby's chart. Lactation Consultation Note  Patient Name: Boy Nedra HaiLatisha Sanderlin ZOXWR'UToday's Date: 04/13/2018 Reason for consult: Follow-up assessment;Early term 537-38.6wks  Visited with P2 Mom of ET infant at 7335w4d, baby at 7% weight loss on day of discharge, baby 5681 hrs old.  Baby is sleepy on the breast, and Mom is pumping regularly.  Mom able to express 35 ml at each pumping.  Baby being supplemented with formula and EBM by bottle. Mom has a DEBP at home.   Mom interested in OP appt with Lactation.  Reviewed volume guidelines, to increase volume to 30-45 ml  EBM+/formula, increasing as tolerated.    Engorgement prevention and treatment discussed. Mom aware of OP lactation support available and knows to call for guidance prn.  Plan- 1- STS as much as possible 2- On cue, or at least every 3 hrs, feed baby trying breast first 3- Follow with supplement by paced bottle (volume parameters discussed) EBM+/formula 4- Pump both breasts 15-20 mins, adding breast massage and hand expression. 5- Follow up with OP lactation consultant. 6- call prn for concerns   Referral sent to Clinic for OP appt.   Interventions Interventions: Breast feeding basics reviewed;Skin to skin;Breast massage;Hand express;DEBP;Pre-pump if needed  Lactation Tools Discussed/Used Tools: Pump;Coconut oil Flange Size: 27 Breast pump type: Double-Electric Breast Pump   Consult Status Consult Status: Complete Date: 04/13/18 Follow-up type: Out-patient    Judee ClaraSmith, [place] Athanas E 04/13/2018, 9:49 AM

## 2018-04-15 LAB — AEROBIC/ANAEROBIC CULTURE (SURGICAL/DEEP WOUND)
CULTURE: NO GROWTH
GRAM STAIN: NONE SEEN

## 2018-04-15 LAB — AEROBIC/ANAEROBIC CULTURE W GRAM STAIN (SURGICAL/DEEP WOUND)

## 2019-06-09 IMAGING — DX DG ABD PORTABLE 1V
1 series · 1 of 1 positions shown · non-contrast
Comparison: None.

CLINICAL DATA: Incorrect count for OR procedure. One extra needle
found on table.

EXAM:
PORTABLE ABDOMEN - 1 VIEW

[abdomen]
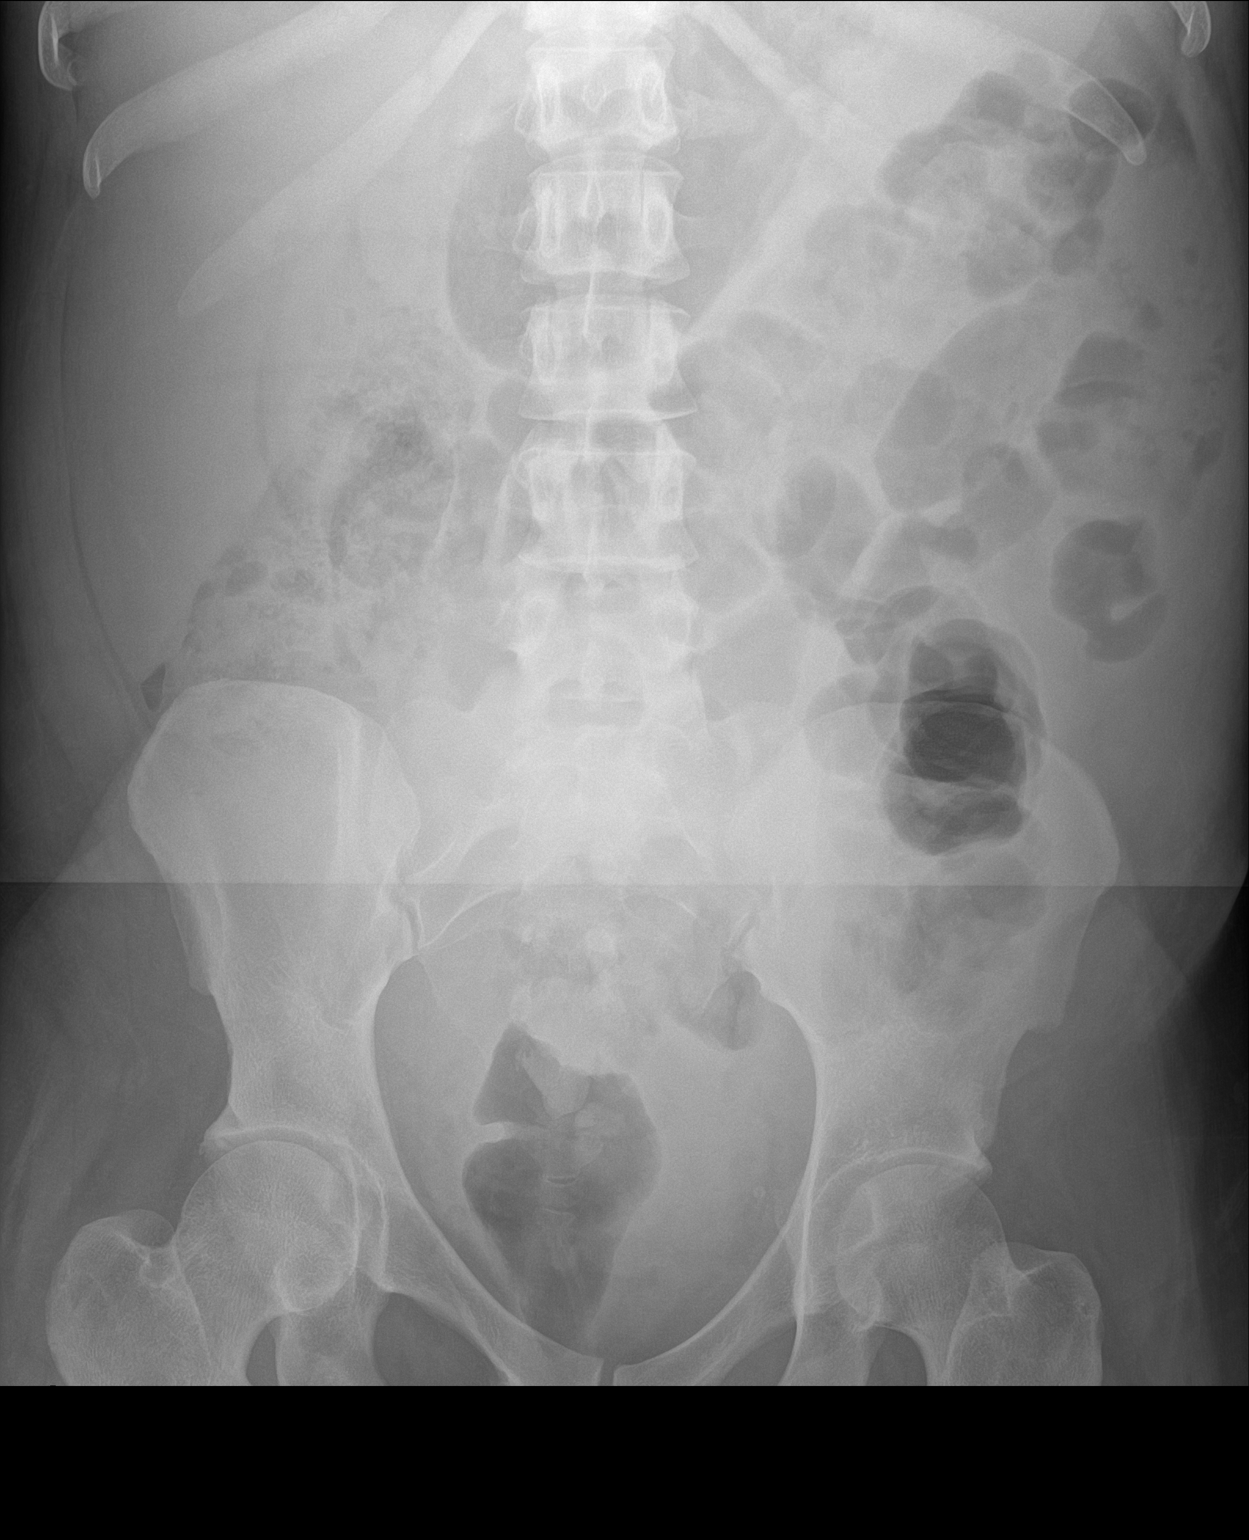

[1 of 1 positions shown; findings below may reference images not displayed]

FINDINGS: No radiopaque foreign body or findings to suggest retained surgical
instrument. Normal bowel gas pattern. No acute osseous
abnormalities.
IMPRESSION: No radiopaque foreign body or findings to suggest retained surgical
instrument.

Findings discussed with the operating room at the time of the exam.

## 2019-07-16 ENCOUNTER — Telehealth: Payer: Self-pay | Admitting: Family Medicine

## 2019-07-16 NOTE — Telephone Encounter (Signed)
The patient called in stating [facility] ob/gyn does not accept family planning and she will need to continue care with our office for future family planning.

## 2019-07-19 ENCOUNTER — Encounter: Payer: Self-pay | Admitting: *Deleted

## 2019-08-15 ENCOUNTER — Encounter: Payer: Self-pay | Admitting: Family Medicine

## 2019-08-15 ENCOUNTER — Other Ambulatory Visit: Payer: Self-pay

## 2019-08-15 ENCOUNTER — Ambulatory Visit (INDEPENDENT_AMBULATORY_CARE_PROVIDER_SITE_OTHER): Payer: Medicaid Other | Admitting: Family Medicine

## 2019-08-15 ENCOUNTER — Other Ambulatory Visit (HOSPITAL_COMMUNITY)
Admission: RE | Admit: 2019-08-15 | Discharge: 2019-08-15 | Disposition: A | Discharge status: Home / Self Care | Payer: Medicaid Other | Source: Ambulatory Visit | Attending: Family Medicine | Admitting: Family Medicine

## 2019-08-15 VITALS — BP 120/84 | HR 79 | Ht 63.0 in | Wt 147.2 lb

## 2019-08-15 DIAGNOSIS — Z3042 Encounter for surveillance of injectable contraceptive: Secondary | ICD-10-CM | POA: Diagnosis not present

## 2019-08-15 DIAGNOSIS — Z8619 Personal history of other infectious and parasitic diseases: Secondary | ICD-10-CM | POA: Diagnosis not present

## 2019-08-15 DIAGNOSIS — Z113 Encounter for screening for infections with a predominantly sexual mode of transmission: Secondary | ICD-10-CM | POA: Diagnosis not present

## 2019-08-15 DIAGNOSIS — Z01419 Encounter for gynecological examination (general) (routine) without abnormal findings: Secondary | ICD-10-CM

## 2019-08-15 MED ORDER — MEDROXYPROGESTERONE ACETATE 150 MG/ML IM SUSP: 150.0000 mg | INTRAMUSCULAR | 0 refills | Status: DC

## 2019-08-15 MED ORDER — MEDROXYPROGESTERONE ACETATE 150 MG/ML IM SUSP
150.0000 mg | Freq: Once | INTRAMUSCULAR | Status: AC
  Administered 2019-08-15: 09:00:00 150 mg via INTRAMUSCULAR

## 2019-08-15 NOTE — Patient Instructions (Signed)

## 2019-08-15 NOTE — Addendum Note (Signed)
Addended by: Henrietta Dine on: 08/15/2019 09:48 AM   Modules accepted: Orders

## 2019-08-15 NOTE — Progress Notes (Signed)
GYNECOLOGY ANNUAL PREVENTATIVE CARE ENCOUNTER NOTE  Subjective:   Kristin Kemp is a 29 y.o. [id number] female here for a annual gynecologic exam. Current complaints: None.   Denies abnormal vaginal bleeding, discharge, pelvic pain, problems with intercourse or other gynecologic concerns.    Gynecologic History No LMP recorded. Patient has had an injection. Contraception: Depo-Provera injections Last Pap: 2016. Results were: normal Last mammogram: n/a. Gardisil: has received  Obstetric History OB History  Gravida Para Term Preterm AB Living  3 2 2   1 2   SAB TAB Ectopic Multiple Live Births  1     0 2    # Outcome Date GA Lbr Len/2nd Weight Sex Delivery Anes PTL Lv  3 Term 04/10/18 [redacted]w[redacted]d  6 lb 11.9 oz (3.06 kg) M CS-LTranv Spinal  LIV  2 Term 06/30/11 [redacted]w[redacted]d 07:50 / 00:22 6 lb 9 oz (2.977 kg) F Vag-Spont EPI  LIV     Birth Comments: WNL   1 SAB             Past Medical History:  Diagnosis Date  . Abnormal Pap smear   . Allergy   . Anemia   . Herpes   . Late prenatal care   . Ovarian cyst   . PID (pelvic inflammatory disease)   . Urinary tract infection     Past Surgical History:  Procedure Laterality Date  . CESAREAN SECTION N/A 04/10/2018   Procedure: CESAREAN SECTION;  Surgeon: Paula Compton, MD;  Location: [facility];  Service: Obstetrics;  Laterality: N/A;  . DILATION AND CURETTAGE OF UTERUS  2009    Current Outpatient Medications on File Prior to Visit  Medication Sig Dispense Refill  . ELDERBERRY PO Take by mouth.    Marland Kitchen ibuprofen (ADVIL,MOTRIN) 800 MG tablet Take 1 tablet (800 mg total) by mouth every 8 (eight) hours. 30 tablet 0  . Multiple Vitamin (MULTIVITAMIN) LIQD Take 5 mLs by mouth daily.    . IRON PO Take 1 tablet by mouth daily.    Marland Kitchen oxyCODONE (OXY IR/ROXICODONE) 5 MG immediate release tablet Take 1-2 tablets (5-10 mg total) by mouth every 4 (four) hours as needed for moderate pain. (Patient not taking: Reported on 08/15/2019) 30  tablet 0  . Prenatal Vit-Fe Fumarate-FA (PRENATAL MULTIVITAMIN) TABS tablet Take 1 tablet by mouth daily at 12 noon.     No current facility-administered medications on file prior to visit.    Allergies  Allergen Reactions  . Chocolate Hives and Itching  . Cinnamon Other (See Comments)    Turns tongue raw.  . Latex Other (See Comments)    Reports vaginal sensitivity after latex condom use.    Social History   Socioeconomic History  . Marital status: Single    Spouse name: Not on file  . Number of children: Not on file  . Years of education: Not on file  . Highest education level: Not on file  Occupational History  . Not on file  Tobacco Use  . Smoking status: Current Some Day Smoker    Packs/day: 0.50    Types: Cigarettes    Last attempt to quit: 10/21/2010    Years since quitting: 8.8  . Smokeless tobacco: Current User  Substance and Sexual Activity  . Alcohol use: Not Currently  . Drug use: Yes    Types: Marijuana    Comment: occasionally  . Sexual activity: Yes    Birth control/protection: Injection  Other Topics Concern  . Not on  file  Social History Narrative  . Not on file   Social Determinants of Health   Financial Resource Strain:   . Difficulty of Paying Living Expenses:   Food Insecurity:   . Worried About Programme researcher, broadcasting/film/video in the Last Year:   . Barista in the Last Year:   Transportation Needs:   . Freight forwarder (Medical):   Marland Kitchen Lack of Transportation (Non-Medical):   Physical Activity:   . Days of Exercise per Week:   . Minutes of Exercise per Session:   Stress:   . Feeling of Stress :   Social Connections:   . Frequency of Communication with Friends and Family:   . Frequency of Social Gatherings with Friends and Family:   . Attends Religious Services:   . Active Member of Clubs or Organizations:   . Attends Banker Meetings:   Marland Kitchen Marital Status:   Intimate Partner Violence:   . Fear of Current or Ex-Partner:    . Emotionally Abused:   Marland Kitchen Physically Abused:   . Sexually Abused:     Family History  Problem Relation Age of Onset  . Hypertension Maternal Grandmother   . Hyperlipidemia Maternal Grandmother   . Heart disease Maternal Grandmother        enlarged heart  . Diabetes Maternal Uncle   . Hypertension Maternal Uncle   . Kidney disease Maternal Uncle   . Cancer Paternal Aunt        lung  . Cancer Paternal Uncle        lung  . Diabetes Maternal Grandfather   . Anesthesia problems Neg Hx     Diet: varied Exercise: reports  The following portions of the patient's history were reviewed and updated as appropriate: allergies, current medications, past family history, past medical history, past social history, past surgical history and problem list.  Review of Systems Pertinent items are noted in HPI.   Objective:  BP 120/84   Pulse 79   Ht 5\' 3"  (1.6 m)   Wt 147 lb 3.2 oz (66.8 kg)   BMI 26.08 kg/m  CONSTITUTIONAL: Well-developed, well-nourished female in no acute distress.  HENT:  Normocephalic, atraumatic, External right and left ear normal. Oropharynx is clear and moist EYES: Conjunctivae and EOM are normal. Pupils are equal, round, and reactive to light. No scleral icterus.  NECK: Normal range of motion, supple, no masses.  Normal thyroid.  SKIN: Skin is warm and dry. No rash noted. Not diaphoretic. No erythema. No pallor. NEUROLOGIC: Alert and oriented to person, place, and time. Normal reflexes, muscle tone coordination. No cranial nerve deficit noted. PSYCHIATRIC: Normal mood and affect. Normal behavior. Normal judgment and thought content. CARDIOVASCULAR: Normal heart rate noted, regular rhythm RESPIRATORY: Clear to auscultation bilaterally. Effort and breath sounds normal, no problems with respiration noted. BREASTS: Symmetric in size. No masses, skin changes, nipple drainage, or lymphadenopathy. Nipple peircings present bilaterally. ABDOMEN: Soft, normal bowel sounds,  no distention noted.  No tenderness, rebound or guarding.  PELVIC: Normal appearing external genitalia, clitoral piercing present; normal appearing vaginal mucosa and cervix.  No abnormal discharge noted.  Pap smear obtained.  Normal uterine size, no other palpable masses, no uterine or adnexal tenderness. MUSCULOSKELETAL: Normal range of motion. No tenderness.  No cyanosis, clubbing, or edema. 2+ distal pulses.  Exam done with chaperone present.  Assessment and Plan:   1. Well female exam with routine gynecological exam - Cytology - PAP( [place])  2. Screening  for STD (sexually transmitted disease) - HIV Antibody (routine testing w rflx) - RPR - Hepatitis C Antibody - Hepatitis B Surface AntiGEN  3. History of herpes genitalis No recent outbreaks  4. History of gonorrhea History of PID, previous treatment, no recurrence  5. Encounter for surveillance of injectable contraceptive - medroxyPROGESTERone (DEPO-PROVERA) 150 MG/ML injection; Inject 1 mL (150 mg total) into the muscle every 3 (three) months.  Dispense: 1 mL; Refill: 0 - Information about Nexplanon given  Will follow up results of pap smear/STI screen and manage accordingly. Encouraged improvement in diet and exercise.  -Mammogram n/a -Flu vaccine declined today -Gardisil- has already received  Routine preventative health maintenance measures emphasized. Please refer to After Visit Summary for other counseling recommendations.   Total face-to-face time with patient: 11 minutes. Over 50% of encounter was spent on counseling and coordination of care.  Marlowe Alt, DO OB Fellow, Faculty Practice 08/15/2019 9:15 AM

## 2019-08-16 LAB — CYTOLOGY - PAP
Chlamydia: NEGATIVE
Comment: NEGATIVE
Comment: NEGATIVE
Comment: NORMAL
Diagnosis: NEGATIVE
High risk HPV: NEGATIVE
Neisseria Gonorrhea: NEGATIVE

## 2019-08-16 LAB — HEPATITIS B SURFACE ANTIGEN: Hepatitis B Surface Ag: NEGATIVE

## 2019-08-16 LAB — HIV ANTIBODY (ROUTINE TESTING W REFLEX): HIV Screen 4th Generation wRfx: NONREACTIVE

## 2019-08-16 LAB — RPR: RPR Ser Ql: NONREACTIVE

## 2019-08-16 LAB — HEPATITIS C ANTIBODY: Hep C Virus Ab: <0.1 s/co ratio (ref 0.0–0.9)

## 2019-10-15 ENCOUNTER — Telehealth (INDEPENDENT_AMBULATORY_CARE_PROVIDER_SITE_OTHER): Payer: Self-pay | Admitting: Family Medicine

## 2019-10-15 DIAGNOSIS — Z3009 Encounter for other general counseling and advice on contraception: Secondary | ICD-10-CM

## 2019-10-15 NOTE — Telephone Encounter (Signed)
Patient called in stating that she would like to speak to a nurse about her birth control. Patient stated that she has been on the depo for years and has never experienced the symptoms that she has been now. Patient instructed that a message will be sent to the nurses and they will contact her as soon as they can. Patient verbalized understanding and message sent to clinical pool.

## 2019-10-15 NOTE — Telephone Encounter (Signed)
Patient called and left message on nurse voicemail line stating she is having some side effects from the Depo shot & has been on it for years. Patient is requesting a call back.   Called patient, no answer- left message stating we are trying to reach you to return your phone call, please call us back if you still need assistance.

## 2019-10-16 NOTE — Telephone Encounter (Signed)
Patient called and left voicemail message requesting a callback.  Called patient and she states for the past week she has had spotting and cramping but only when she lays down. Patient was concerned. States she has been on depo for years and has never had this issue. Discussed with patient that is a common side effect from the Depo and isn't anything to worry about. Reviewed next scheduled appt with patient. Patient verbalized understanding & had no questions.

## 2019-10-30 ENCOUNTER — Telehealth: Payer: Self-pay | Admitting: Family Medicine

## 2019-10-30 NOTE — Telephone Encounter (Signed)
Patient called in to cancel her depo injection appointment on 7/7. Patient stated that she will be getting off birth control because she does not like the side effects.

## 2019-10-31 ENCOUNTER — Ambulatory Visit: Payer: Medicaid Other

## 2020-09-16 ENCOUNTER — Encounter (HOSPITAL_COMMUNITY): Payer: Self-pay | Admitting: Emergency Medicine

## 2020-09-16 ENCOUNTER — Emergency Department (HOSPITAL_COMMUNITY)
Admission: EM | Admit: 2020-09-16 | Discharge: 2020-09-16 | Discharge status: Against Medical Advice | Payer: BC Managed Care – PPO | Attending: Emergency Medicine | Admitting: Emergency Medicine

## 2020-09-16 ENCOUNTER — Emergency Department (HOSPITAL_BASED_OUTPATIENT_CLINIC_OR_DEPARTMENT_OTHER)
Admission: EM | Admit: 2020-09-16 | Discharge: 2020-09-16 | Disposition: A | Discharge status: Home / Self Care | Payer: BC Managed Care – PPO | Source: Home / Self Care | Attending: Emergency Medicine | Admitting: Emergency Medicine

## 2020-09-16 ENCOUNTER — Other Ambulatory Visit: Payer: Self-pay

## 2020-09-16 ENCOUNTER — Encounter (HOSPITAL_BASED_OUTPATIENT_CLINIC_OR_DEPARTMENT_OTHER): Payer: Self-pay | Admitting: Emergency Medicine

## 2020-09-16 DIAGNOSIS — B349 Viral infection, unspecified: Secondary | ICD-10-CM

## 2020-09-16 DIAGNOSIS — Z28310 Unvaccinated for covid-19: Secondary | ICD-10-CM | POA: Insufficient documentation

## 2020-09-16 DIAGNOSIS — Z91040 Latex allergy status: Secondary | ICD-10-CM | POA: Insufficient documentation

## 2020-09-16 DIAGNOSIS — R109 Unspecified abdominal pain: Secondary | ICD-10-CM | POA: Diagnosis not present

## 2020-09-16 DIAGNOSIS — F17210 Nicotine dependence, cigarettes, uncomplicated: Secondary | ICD-10-CM | POA: Insufficient documentation

## 2020-09-16 DIAGNOSIS — U071 COVID-19: Secondary | ICD-10-CM | POA: Insufficient documentation

## 2020-09-16 DIAGNOSIS — R509 Fever, unspecified: Secondary | ICD-10-CM | POA: Diagnosis present

## 2020-09-16 DIAGNOSIS — R112 Nausea with vomiting, unspecified: Secondary | ICD-10-CM | POA: Diagnosis not present

## 2020-09-16 LAB — URINALYSIS, ROUTINE W REFLEX MICROSCOPIC
Bilirubin Urine: NEGATIVE
Glucose, UA: 50 mg/dL — AB
Ketones, ur: 20 mg/dL — AB
Leukocytes,Ua: NEGATIVE
Nitrite: NEGATIVE
Protein, ur: 30 mg/dL — AB
Specific Gravity, Urine: 1.012 (ref 1.005–1.030)
pH: 6 (ref 5.0–8.0)

## 2020-09-16 LAB — CBC WITH DIFFERENTIAL/PLATELET
Abs Immature Granulocytes: 0.03 10*3/uL (ref 0.00–0.07)
Basophils Absolute: 0 10*3/uL (ref 0.0–0.1)
Basophils Relative: 0 %
Eosinophils Absolute: 0 10*3/uL (ref 0.0–0.5)
Eosinophils Relative: 0 %
HCT: 43.6 % (ref 36.0–46.0)
Hemoglobin: 14.8 g/dL (ref 12.0–15.0)
Immature Granulocytes: 0 %
Lymphocytes Relative: 14 %
Lymphs Abs: 1 10*3/uL (ref 0.7–4.0)
MCH: 28.6 pg (ref 26.0–34.0)
MCHC: 33.9 g/dL (ref 30.0–36.0)
MCV: 84.2 fL (ref 80.0–100.0)
Monocytes Absolute: 0.7 10*3/uL (ref 0.1–1.0)
Monocytes Relative: 10 %
Neutro Abs: 5.3 10*3/uL (ref 1.7–7.7)
Neutrophils Relative %: 76 %
Platelets: 262 10*3/uL (ref 150–400)
RBC: 5.18 MIL/uL — ABNORMAL HIGH (ref 3.87–5.11)
RDW: 13.8 % (ref 11.5–15.5)
WBC: 7 10*3/uL (ref 4.0–10.5)
nRBC: 0 % (ref 0.0–0.2)

## 2020-09-16 LAB — I-STAT BETA HCG BLOOD, ED (MC, WL, AP ONLY): I-stat hCG, quantitative: <5 m[IU]/mL (ref <5)

## 2020-09-16 LAB — COMPREHENSIVE METABOLIC PANEL
ALT: 39 U/L (ref 0–44)
AST: 31 U/L (ref 15–41)
Albumin: 4.1 g/dL (ref 3.5–5.0)
Alkaline Phosphatase: 61 U/L (ref 38–126)
Anion gap: 11 (ref 5–15)
BUN: 5 mg/dL — ABNORMAL LOW (ref 6–20)
CO2: 23 mmol/L (ref 22–32)
Calcium: 9.5 mg/dL (ref 8.9–10.3)
Chloride: 102 mmol/L (ref 98–111)
Creatinine, Ser: 0.87 mg/dL (ref 0.44–1.00)
GFR, Estimated: >60 mL/min (ref >60)
Glucose, Bld: 85 mg/dL (ref 70–99)
Potassium: 3.4 mmol/L — ABNORMAL LOW (ref 3.5–5.1)
Sodium: 136 mmol/L (ref 135–145)
Total Bilirubin: 0.9 mg/dL (ref 0.3–1.2)
Total Protein: 6.9 g/dL (ref 6.5–8.1)

## 2020-09-16 LAB — LIPASE, BLOOD: Lipase: 28 U/L (ref 11–51)

## 2020-09-16 MED ORDER — POTASSIUM CHLORIDE CRYS ER 20 MEQ PO TBCR
40.0000 meq | EXTENDED_RELEASE_TABLET | Freq: Once | ORAL | Status: AC
  Administered 2020-09-16: 40 meq via ORAL
  Filled 2020-09-16: qty 2

## 2020-09-16 MED ORDER — ONDANSETRON 4 MG PO TBDP
4.0000 mg | ORAL_TABLET | Freq: Once | ORAL | Status: AC
  Administered 2020-09-16: 4 mg via ORAL
  Filled 2020-09-16: qty 1

## 2020-09-16 MED ORDER — ONDANSETRON 4 MG PO TBDP: 4.0000 mg | ORAL_TABLET | Freq: Three times a day (TID) | ORAL | 0 refills | Status: DC | PRN

## 2020-09-16 NOTE — ED Triage Notes (Addendum)
Pt presents to ED POV. Pt c/o abd pain, leg cramping that began yesterday. Pt reports unable to hold anything down. Emesis x3 today. LWBS at [facility], labs drawn

## 2020-09-16 NOTE — ED Provider Notes (Signed)
[facility] EMERGENCY DEPARTMENT Provider Note  CSN: [id number] Arrival date & time: 09/16/20 1913    History Chief Complaint  Patient presents with  . Emesis    HPI  Kristin Kemp is a 30 y.o. female with no significant PMH reports she was not feeling well yesterday with subjective fevers, body aches, chills, dry cough. She took some OTC cold medicine and began having abdominal cramping and vomiting. She was unable to keep anything down during the day today. She went to [facility] today but let after having her labs drawn and came here for evaluation instead. Has not had Covid vaccine.    Past Medical History:  Diagnosis Date  . Abnormal Pap smear   . Allergy   . Anemia   . Herpes   . Late prenatal care   . Ovarian cyst   . PID (pelvic inflammatory disease)   . Urinary tract infection     Past Surgical History:  Procedure Laterality Date  . CESAREAN SECTION N/A 04/10/2018   Procedure: CESAREAN SECTION;  Surgeon: Huel Cote, MD;  Location: [facility] BIRTHING SUITES;  Service: Obstetrics;  Laterality: N/A;  . DILATION AND CURETTAGE OF UTERUS  2009    Family History  Problem Relation Age of Onset  . Hypertension Maternal Grandmother   . Hyperlipidemia Maternal Grandmother   . Heart disease Maternal Grandmother        enlarged heart  . Diabetes Maternal Uncle   . Hypertension Maternal Uncle   . Kidney disease Maternal Uncle   . Cancer Paternal Aunt        lung  . Cancer Paternal Uncle        lung  . Diabetes Maternal Grandfather   . Anesthesia problems Neg Hx     Social History   Tobacco Use  . Smoking status: Current Some Day Smoker    Packs/day: 0.50    Types: Cigarettes    Last attempt to quit: 10/21/2010    Years since quitting: 9.9  . Smokeless tobacco: Current User  Vaping Use  . Vaping Use: Never used  Substance Use Topics  . Alcohol use: Not Currently  . Drug use: Yes    Types: Marijuana    Comment: occasionally     Home  Medications Prior to Admission medications   Medication Sig Start Date End Date Taking? Authorizing Provider  ibuprofen (ADVIL,MOTRIN) 800 MG tablet Take 1 tablet (800 mg total) by mouth every 8 (eight) hours. 04/13/18  Yes Huel Cote, MD  ondansetron (ZOFRAN ODT) 4 MG disintegrating tablet Take 1 tablet (4 mg total) by mouth every 8 (eight) hours as needed for nausea or vomiting. 09/16/20  Yes Pollyann Savoy, MD  ELDERBERRY PO Take by mouth.    [provider]  IRON PO Take 1 tablet by mouth daily.    [provider]  Multiple Vitamin (MULTIVITAMIN) LIQD Take 5 mLs by mouth daily.    [provider]  oxyCODONE (OXY IR/ROXICODONE) 5 MG immediate release tablet Take 1-2 tablets (5-10 mg total) by mouth every 4 (four) hours as needed for moderate pain. Patient not taking: Reported on 08/15/2019 04/13/18   Huel Cote, MD  Prenatal Vit-Fe Fumarate-FA (PRENATAL MULTIVITAMIN) TABS tablet Take 1 tablet by mouth daily at 12 noon.    [provider]     Allergies    Chocolate, Cinnamon, and Latex   Review of Systems   Review of Systems A comprehensive review of systems was completed and negative except  as noted in HPI.    Physical Exam BP 121/87 (BP Location: Right Arm)   Pulse 69   Temp 98.6 F (37 C) (Oral)   Resp 18   Ht 5\' 3"  (1.6 m)   Wt 63.5 kg   LMP 08/24/2020   SpO2 100%   BMI 24.80 kg/m   Physical Exam Vitals and nursing note reviewed.  Constitutional:      Appearance: Normal appearance.  HENT:     Head: Normocephalic and atraumatic.     Nose: Nose normal.     Mouth/Throat:     Mouth: Mucous membranes are moist.  Eyes:     Extraocular Movements: Extraocular movements intact.     Conjunctiva/sclera: Conjunctivae normal.  Cardiovascular:     Rate and Rhythm: Normal rate.  Pulmonary:     Effort: Pulmonary effort is normal.     Breath sounds: Normal breath sounds.  Abdominal:     General: Abdomen is flat.      Palpations: Abdomen is soft.     Tenderness: There is no abdominal tenderness.  Musculoskeletal:        General: No swelling. Normal range of motion.     Cervical back: Neck supple.  Skin:    General: Skin is warm and dry.  Neurological:     General: No focal deficit present.     Mental Status: She is alert.  Psychiatric:        Mood and Affect: Mood normal.      ED Results / Procedures / Treatments   Labs (all labs ordered are listed, but only abnormal results are displayed) Labs Reviewed  SARS CORONAVIRUS 2 (TAT 6-24 HRS)    EKG None   Radiology No results found.  Procedures Procedures  Medications Ordered in the ED Medications  potassium chloride SA (KLOR-CON) CR tablet 40 mEq (has no administration in time range)  ondansetron (ZOFRAN-ODT) disintegrating tablet 4 mg (4 mg Oral Given 09/16/20 2019)     MDM Rules/Calculators/A&P MDM Labs from [facility] reviewed and unremarkable. Given Zofran with improvement in nausea. Needs Covid swab, not collected at [facility] ED.  ED Course  I have reviewed the triage vital signs and the nursing notes.  Pertinent labs & imaging results that were available during my care of the patient were reviewed by me and considered in my medical decision making (see chart for details).  Clinical Course as of 09/16/20 2117  Tue Sep 16, 2020  2114 Patient tolerating PO well. She reports some cramping in legs. She had a borderline low K on labs from earlier. Will give one dose of K-Dur, Covid is pending, patient understands quarantine rules pending that result. RTED for any other concerns.  [CS]    Clinical Course User Index [CS] Sep 18, 2020, MD    Final Clinical Impression(s) / ED Diagnoses Final diagnoses:  Viral syndrome    Rx / DC Orders ED Discharge Orders         Ordered    ondansetron (ZOFRAN ODT) 4 MG disintegrating tablet  Every 8 hours PRN        09/16/20 2116           2117, MD 09/16/20 2117

## 2020-09-16 NOTE — ED Provider Notes (Signed)
Emergency Medicine Provider Triage Evaluation Note  Kristin Kemp , a 30 y.o. female  was evaluated in triage.  Pt complains of N/V/D, body aches, cough.  Review of Systems  Positive: Nausea, vomiting, diarrhea, cough, intermittent abdominal pain Negative: Known fever, chest pain, shortness of breath  Physical Exam  BP 120/82 (BP Location: Left Arm)   Pulse 92   Temp 98.9 F (37.2 C) (Oral)   Resp 16   SpO2 100%  Gen:   Awake, no distress   Resp:  Normal effort  MSK:   Moves extremities without difficulty  Other:  Abdomen nondistended nontender  Medical Decision Making  Medically screening exam initiated at 5:35 PM.  Appropriate orders placed.  Franne Forts was informed that the remainder of the evaluation will be completed by another provider, this initial triage assessment does not replace that evaluation, and the importance of remaining in the ED until their evaluation is complete.     Anselm Pancoast, PA-C 09/16/20 1737    Charlynne Pander, MD 09/17/20 [phone]

## 2020-09-16 NOTE — ED Triage Notes (Signed)
Pt c/o nausea/vomiting and abdominal pain that started yesterday.

## 2020-09-16 NOTE — ED Notes (Addendum)
Pt decided to leave due to wait time after being advised to continue to seek care.

## 2020-09-17 LAB — SARS CORONAVIRUS 2 (TAT 6-24 HRS): SARS Coronavirus 2: POSITIVE — AB

## 2021-01-05 ENCOUNTER — Other Ambulatory Visit: Payer: Self-pay

## 2021-01-05 ENCOUNTER — Encounter (HOSPITAL_BASED_OUTPATIENT_CLINIC_OR_DEPARTMENT_OTHER): Payer: Self-pay | Admitting: *Deleted

## 2021-01-05 ENCOUNTER — Emergency Department (HOSPITAL_BASED_OUTPATIENT_CLINIC_OR_DEPARTMENT_OTHER)
Admission: EM | Admit: 2021-01-05 | Discharge: 2021-01-05 | Disposition: A | Discharge status: Home / Self Care | Payer: Medicaid Other | Attending: Emergency Medicine | Admitting: Emergency Medicine

## 2021-01-05 DIAGNOSIS — Z91040 Latex allergy status: Secondary | ICD-10-CM | POA: Diagnosis not present

## 2021-01-05 DIAGNOSIS — J029 Acute pharyngitis, unspecified: Secondary | ICD-10-CM | POA: Insufficient documentation

## 2021-01-05 DIAGNOSIS — F17210 Nicotine dependence, cigarettes, uncomplicated: Secondary | ICD-10-CM | POA: Insufficient documentation

## 2021-01-05 LAB — GROUP A STREP BY PCR: Group A Strep by PCR: NOT DETECTED

## 2021-01-05 NOTE — ED Triage Notes (Signed)
C/o sore throat and right neck pain x 3 days

## 2021-01-05 NOTE — ED Provider Notes (Signed)
[facility] EMERGENCY DEPARTMENT Provider Note   CSN: [id number] Arrival date & time: 01/05/21  1544     History Chief Complaint  Patient presents with   Sore Throat    Kristin Kemp is a 29 y.o. female who presents to the ED today with complaint of gradual onset, constant, achy, right neck/throat pain for the past 3-4 days.  Patient complains of lymphadenopathy to the right side of her neck for the past 3 to 4 days.  She states she thought she had a cold so she has been taking over-the-counter medications without much relief.  She also has tried over-the-counter antihistamines without much relief.  She states if she turns her neck a certain way she will have more pain and aching sensation however she does feel a knot in this area.  She did not denies a specific sore throat.  She denies any fevers, chills, rhinorrhea, congestion, cough, body aches, difficulty swallowing, voice change, any other associated symptoms.  The history is provided by the patient and medical records.      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 04/09/2018   Abdominal pain, generalized 01/11/2016   BV (bacterial vaginosis) 01/11/2016   Encounter for cosmetic surgery 07/07/2015    Past Surgical History:  Procedure Laterality Date   CESAREAN SECTION N/A 04/10/2018   Procedure: CESAREAN SECTION;  Surgeon: Huel Cote, MD;  Location: [facility] BIRTHING SUITES;  Service: Obstetrics;  Laterality: N/A;   DILATION AND CURETTAGE OF UTERUS  2009     OB History     Gravida  3   Para  2   Term  2    Preterm      AB  1   Living  2      SAB  1   IAB      Ectopic      Multiple  0   Live Births  2           Family History  Problem Relation Age of Onset   Hypertension Maternal Grandmother    Hyperlipidemia Maternal Grandmother    Heart disease Maternal Grandmother        enlarged heart   Diabetes Maternal Uncle    Hypertension Maternal Uncle    Kidney disease Maternal Uncle    Cancer Paternal Aunt        lung   Cancer Paternal Uncle        lung   Diabetes Maternal Grandfather    Anesthesia problems Neg Hx     Social History   Tobacco Use   Smoking status: Some Days    Packs/day: 0.50    Types: Cigarettes    Last attempt to quit: 10/21/2010    Years since quitting: 10.2   Smokeless tobacco: Current  Vaping Use   Vaping Use: Never used  Substance Use Topics   Alcohol use: Not Currently   Drug use: Yes    Types: Marijuana    Comment: occasionally    Home Medications  Prior to Admission medications   Medication Sig Start Date End Date Taking? Authorizing Provider  ELDERBERRY PO Take by mouth.    [provider]  ibuprofen (ADVIL,MOTRIN) 800 MG tablet Take 1 tablet (800 mg total) by mouth every 8 (eight) hours. 04/13/18   Huel Cote, MD  IRON PO Take 1 tablet by mouth daily.    [provider]  Multiple Vitamin (MULTIVITAMIN) LIQD Take 5 mLs by mouth daily.    [provider]  ondansetron (ZOFRAN ODT) 4 MG disintegrating tablet Take 1 tablet (4 mg total) by mouth every 8 (eight) hours as needed for nausea or vomiting. 09/16/20   Pollyann Savoy, MD  oxyCODONE (OXY IR/ROXICODONE) 5 MG immediate release tablet Take 1-2 tablets (5-10 mg total) by mouth every 4 (four) hours as needed for moderate pain. Patient not taking: Reported on 08/15/2019 04/13/18   Huel Cote, MD  Prenatal Vit-Fe Fumarate-FA (PRENATAL MULTIVITAMIN) TABS tablet Take 1 tablet by mouth daily at 12 noon.    [provider]     Allergies    Chocolate, Cinnamon, and Latex  Review of Systems   Review of Systems  Constitutional:  Negative for chills and fever.  HENT:  Negative for ear pain, rhinorrhea, sinus pressure, trouble swallowing and voice change.        + swollen lymph node  All other systems reviewed and are negative.  Physical Exam Updated Vital Signs BP 121/87 (BP Location: Right Arm)   Pulse 67   Temp 98.6 F (37 C) (Oral)   Resp 16   Ht 5\' 3"  (1.6 m)   Wt 65.8 kg   LMP 12/15/2020   SpO2 100%   BMI 25.69 kg/m   Physical Exam Vitals and nursing note reviewed.  Constitutional:      Appearance: She is not ill-appearing.  HENT:     Head: Normocephalic and atraumatic.     Mouth/Throat:     Tonsils: No tonsillar exudate. 1+ on the right. 0 on the left.  Eyes:     Conjunctiva/sclera: Conjunctivae normal.  Neck:     Thyroid: No thyromegaly.  Cardiovascular:     Rate and Rhythm: Normal rate and regular rhythm.     Heart sounds: Normal heart sounds.  Pulmonary:     Effort: Pulmonary effort is normal.     Breath sounds: Normal breath sounds. No wheezing, rhonchi or rales.  Lymphadenopathy:     Cervical: Cervical adenopathy present.  Skin:    General: Skin is warm and dry.     Coloration: Skin is not jaundiced.  Neurological:     Mental Status: She is alert.    ED Results / Procedures / Treatments   Labs (all labs ordered are listed, but only abnormal results are displayed) Labs Reviewed  GROUP A STREP BY PCR    EKG None  Radiology No results found.  Procedures Procedures   Medications Ordered in ED Medications - No data to display  ED Course  I have reviewed the triage vital signs and the nursing notes.  Pertinent labs & imaging results that were available during my care of the patient were reviewed by me and considered in my medical decision making (see chart for details).    MDM Rules/Calculators/A&P                           30 year old female who  presents to the ED today with complaint of right-sided neck pain/knot to  her neck for the past 3 to 4 days.  No specific sore throat.  On arrival to the ED vitals are stable.  Patient appears to be no acute distress.  She did have a strep test on him on the waiting room which was negative.  On my exam she is noted to have right cervical lymphadenopathy with some mild tenderness palpation.  She also has 1+ tonsillar hypertrophy on the right side however denies sore throat.  She is tolerating her own secretions without difficulty.  No voice change.  She denies any other URI-like symptoms.  Do not feel she requires COVID testing at this time.  Suspect viral pharyngitis causing some lymphadenopathy on the right side given her swollen right tonsil.  Have instructed on ibuprofen as needed for pain and PCP follow-up.  Patient instructed to return to the ED for any new/worsening symptoms.  She is in agreement with plan and stable for discharge.   This note was prepared using Dragon voice recognition software and may include unintentional dictation errors due to the inherent limitations of voice recognition software.  Final Clinical Impression(s) / ED Diagnoses Final diagnoses:  Viral pharyngitis    Rx / DC Orders ED Discharge Orders     None        Discharge Instructions      Your strep test was negative today  Please take Ibuprofen as needed for pain. You can apply either warm compresses or cold compresses to your  neck (whichever seems more helpful to you).   Follow up with your PCP regarding ED visit. If you do not have one you can follow up with [facility] for primary care needs.   Return to the ED for any new/worsening symptoms including worsening pain, inability to swallow liquids, difficulty breathing, or any other new/concerning symptoms.        Tanda Rockers, PA-C 01/05/21 2020    Franne Forts, DO 01/06/21 1626

## 2021-01-05 NOTE — Discharge Instructions (Addendum)
Your strep test was negative today  Please take Ibuprofen as needed for pain. You can apply either warm compresses or cold compresses to your  neck (whichever seems more helpful to you).   Follow up with your PCP regarding ED visit. If you do not have one you can follow up with [facility] for primary care needs.   Return to the ED for any new/worsening symptoms including worsening pain, inability to swallow liquids, difficulty breathing, or any other new/concerning symptoms.

## 2022-03-25 DIAGNOSIS — Z124 Encounter for screening for malignant neoplasm of cervix: Secondary | ICD-10-CM | POA: Diagnosis not present

## 2022-03-25 DIAGNOSIS — Z1151 Encounter for screening for human papillomavirus (HPV): Secondary | ICD-10-CM | POA: Diagnosis not present

## 2022-03-25 DIAGNOSIS — Z30013 Encounter for initial prescription of injectable contraceptive: Secondary | ICD-10-CM | POA: Diagnosis not present

## 2022-03-26 DIAGNOSIS — Z3042 Encounter for surveillance of injectable contraceptive: Secondary | ICD-10-CM | POA: Diagnosis not present

## 2022-05-03 ENCOUNTER — Encounter (HOSPITAL_COMMUNITY): Payer: Self-pay

## 2022-05-03 ENCOUNTER — Other Ambulatory Visit: Payer: Self-pay

## 2022-05-03 ENCOUNTER — Emergency Department (HOSPITAL_COMMUNITY)
Admission: EM | Admit: 2022-05-03 | Discharge: 2022-05-03 | Disposition: A | Discharge status: Home / Self Care | Payer: Medicaid Other | Attending: Emergency Medicine | Admitting: Emergency Medicine

## 2022-05-03 DIAGNOSIS — U071 COVID-19: Secondary | ICD-10-CM | POA: Insufficient documentation

## 2022-05-03 DIAGNOSIS — Z91040 Latex allergy status: Secondary | ICD-10-CM | POA: Insufficient documentation

## 2022-05-03 DIAGNOSIS — M791 Myalgia, unspecified site: Secondary | ICD-10-CM | POA: Diagnosis present

## 2022-05-03 LAB — RESP PANEL BY RT-PCR (RSV, FLU A&B, COVID)  RVPGX2
Influenza A by PCR: NEGATIVE
Influenza B by PCR: NEGATIVE
Resp Syncytial Virus by PCR: NEGATIVE
SARS Coronavirus 2 by RT PCR: POSITIVE — AB

## 2022-05-03 LAB — URINALYSIS, ROUTINE W REFLEX MICROSCOPIC
Bilirubin Urine: NEGATIVE
Glucose, UA: NEGATIVE mg/dL
Hgb urine dipstick: NEGATIVE
Ketones, ur: 5 mg/dL — AB
Leukocytes,Ua: NEGATIVE
Nitrite: NEGATIVE
Protein, ur: NEGATIVE mg/dL
Specific Gravity, Urine: 1.011 (ref 1.005–1.030)
pH: 5 (ref 5.0–8.0)

## 2022-05-03 LAB — PREGNANCY, URINE: Preg Test, Ur: NEGATIVE

## 2022-05-03 LAB — GROUP A STREP BY PCR: Group A Strep by PCR: NOT DETECTED

## 2022-05-03 MED ORDER — MOLNUPIRAVIR 200 MG PO CAPS: 4.0000 | ORAL_CAPSULE | Freq: Two times a day (BID) | ORAL | 0 refills | Status: AC

## 2022-05-03 NOTE — ED Triage Notes (Addendum)
Patient reports when she woke this AM she had swollen glands in her throat and difficulty swallowing. Patient also c/o generalized body aches and a headache. Patient also c/o lower back pain this AM after going to class . Patient denies any dysuria or urinary frequency.

## 2022-05-03 NOTE — ED Provider Notes (Signed)
[facility]-EMERGENCY DEPT Provider Note   CSN: [id number] Arrival date & time: 05/03/22  1321     History  Chief Complaint  Patient presents with   Generalized Body Aches   Lymphadenopathy   Headache   Back Pain    Kristin Kemp is a 32 y.o. female.   Headache Associated symptoms: back pain   Back Pain Associated symptoms: headaches      Patient presents due to pharyngitis, generalized bodyaches, headache, nasal congestion, nonproductive cough for the last few days.  No chest pain, does not feel short of breath.  She has not had any medicine prior to arrival.  Denies any dysuria or hematuria with me, she does endorse some lower back cramping when she describes feeling like "spasm".  Home Medications Prior to Admission medications   Medication Sig Start Date End Date Taking? Authorizing Provider  molnupiravir EUA (LAGEVRIO) 200 MG CAPS capsule Take 4 capsules (800 mg total) by mouth 2 (two) times daily for 5 days. 05/03/22 05/08/22 Yes Crysten Kaman, Rolly Salter, PA-C  ELDERBERRY PO Take by mouth.    [provider]  ibuprofen (ADVIL,MOTRIN) 800 MG tablet Take 1 tablet (800 mg total) by mouth every 8 (eight) hours. 04/13/18   Huel Cote, MD  IRON PO Take 1 tablet by mouth daily.    [provider]  Multiple Vitamin (MULTIVITAMIN) LIQD Take 5 mLs by mouth daily.    [provider]  ondansetron (ZOFRAN ODT) 4 MG disintegrating tablet Take 1 tablet (4 mg total) by mouth every 8 (eight) hours as needed for nausea or vomiting. 09/16/20   Pollyann Savoy, MD  oxyCODONE (OXY IR/ROXICODONE) 5 MG immediate release tablet Take 1-2 tablets (5-10 mg total) by mouth every 4 (four) hours as needed for moderate pain. Patient not taking: Reported on 08/15/2019 04/13/18   Huel Cote, MD  Prenatal Vit-Fe Fumarate-FA (PRENATAL MULTIVITAMIN) TABS tablet Take 1 tablet by mouth daily at 12 noon.    [provider]      Allergies     Chocolate, Cinnamon, and Latex    Review of Systems   Review of Systems  Musculoskeletal:  Positive for back pain.  Neurological:  Positive for headaches.    Physical Exam Updated Vital Signs BP 137/73 (BP Location: Left Arm)   Pulse (!) 110   Temp 99.7 F (37.6 C) (Oral)   Resp 16   Ht 5\' 3"  (1.6 m)   Wt 61.2 kg   LMP 04/14/2022 (Exact Date)   SpO2 100%   BMI 23.91 kg/m  Physical Exam Vitals and nursing note reviewed. Exam conducted with a chaperone present.  Constitutional:      Appearance: Normal appearance.  HENT:     Head: Normocephalic and atraumatic.     Nose: Rhinorrhea present.     Comments: Injected nasal turbinates    Mouth/Throat:     Pharynx: No posterior oropharyngeal erythema.  Eyes:     General: No scleral icterus.       Right eye: No discharge.        Left eye: No discharge.     Extraocular Movements: Extraocular movements intact.     Pupils: Pupils are equal, round, and reactive to light.  Cardiovascular:     Rate and Rhythm: Regular rhythm. Tachycardia present.     Pulses: Normal pulses.     Heart sounds: Normal heart sounds.     No friction rub. No gallop.     Comments: Mildly tachycardic Pulmonary:  Effort: Pulmonary effort is normal. No respiratory distress.     Breath sounds: Normal breath sounds.  Abdominal:     General: Abdomen is flat. Bowel sounds are normal. There is no distension.     Palpations: Abdomen is soft.     Tenderness: There is no abdominal tenderness.  Skin:    General: Skin is warm and dry.     Coloration: Skin is not jaundiced.  Neurological:     Mental Status: She is alert. Mental status is at baseline.     Coordination: Coordination normal.     Comments: Cranial nerves II through XII are grossly intact.  Upper and lower extremity strength symmetric bilaterally.  Ambulatory steady gait.     ED Results / Procedures / Treatments   Labs (all labs ordered are listed, but only abnormal results are  displayed) Labs Reviewed  RESP PANEL BY RT-PCR (RSV, FLU A&B, COVID)  [id number] - Abnormal; Notable for the following components:      Result Value   SARS Coronavirus 2 by RT PCR POSITIVE (*)    All other components within normal limits  URINALYSIS, ROUTINE W REFLEX MICROSCOPIC - Abnormal; Notable for the following components:   APPearance HAZY (*)    Ketones, ur 5 (*)    All other components within normal limits  GROUP A STREP BY PCR  PREGNANCY, URINE    EKG None  Radiology No results found.  Procedures Procedures    Medications Ordered in ED Medications - No data to display  ED Course/ Medical Decision Making/ A&P                           Medical Decision Making Risk Prescription drug management.   Patient presents due to constellation of viral symptoms since yesterday.  On exam she is mildly tachycardic.  Lungs are clear to auscultation.  She is also asymptomatic. Will start with viral panel and reassess. BP 137/73 (BP Location: Left Arm)   Pulse (!) 110   Temp 99.7 F (37.6 C) (Oral)   Resp 16   Ht 5\' 3"  (1.6 m)   Wt 61.2 kg   LMP 04/14/2022 (Exact Date)   SpO2 100%   BMI 23.91 kg/m   Viral panel is notable for being COVID-positive.  UA ordered in triage is negative for signs of UTI, additionally patient is not pregnant.  Strep test was negative.  Discussed results with the patient.  Requesting antivirals which I have sent to the pharmacy.  Do not see any indication for additional workup or hospitalization.  She is not hypoxic, she is well-appearing and appropriate for outpatient follow-up at this time.        Final Clinical Impression(s) / ED Diagnoses Final diagnoses:  [id number]    Rx / DC Orders ED Discharge Orders          Ordered    molnupiravir EUA (LAGEVRIO) 200 MG CAPS capsule  2 times daily        05/03/22 1526              Sherrill Raring, PA-C 05/03/22 1701    Malvin Johns, MD 05/03/22 2149

## 2022-05-03 NOTE — ED Provider Triage Note (Signed)
Emergency Medicine Provider Triage Evaluation Note  Kristin Kemp , a 32 y.o. female  was evaluated in triage.  Pt complains of generalized bodyaches.  Patient reports that she woke up this morning began feeling sick.  She also reports some associated low back pain denies any shortness of breath, chest pain, diarrhea, vomiting.  Patient is currently a Ship broker at [employer] and has known sick contacts of the abdomen people around her is sick.  Review of Systems  Positive: As above Negative: As above  Physical Exam  BP 137/73 (BP Location: Left Arm)   Pulse (!) 110   Temp 99.7 F (37.6 C) (Oral)   Resp 16   SpO2 100%  Gen:   Awake, no distress  Resp:  Normal effort clear to auscultation bilaterally MSK:   Moves extremities without difficulty, mild tenderness to palpation in the low back bilaterally Other:    Medical Decision Making  Medically screening exam initiated at 1:39 PM.  Appropriate orders placed.  Kristin Kemp was informed that the remainder of the evaluation will be completed by another provider, this initial triage assessment does not replace that evaluation, and the importance of remaining in the ED until their evaluation is complete.     Kristin Heller, PA-C 05/03/22 1340

## 2022-05-25 ENCOUNTER — Ambulatory Visit (INDEPENDENT_AMBULATORY_CARE_PROVIDER_SITE_OTHER): Payer: Medicaid Other | Admitting: Primary Care

## 2022-05-25 ENCOUNTER — Encounter (INDEPENDENT_AMBULATORY_CARE_PROVIDER_SITE_OTHER): Payer: Self-pay | Admitting: Primary Care

## 2022-05-25 VITALS — BP 113/74 | HR 92 | Resp 16 | Ht 63.0 in | Wt 130.0 lb

## 2022-05-25 DIAGNOSIS — Z7689 Persons encountering health services in other specified circumstances: Secondary | ICD-10-CM

## 2022-05-25 DIAGNOSIS — N921 Excessive and frequent menstruation with irregular cycle: Secondary | ICD-10-CM

## 2022-05-25 DIAGNOSIS — F4323 Adjustment disorder with mixed anxiety and depressed mood: Secondary | ICD-10-CM | POA: Diagnosis not present

## 2022-05-25 NOTE — Progress Notes (Signed)
New Patient Office Visit  Subjective    Patient ID: Kristin Kemp, female    DOB: Oct 26, 1990  Age: 32 y.o. MRN: [id number]  CC:  Chief Complaint  Patient presents with   New Patient (Initial Visit)    HPI Kristin Kemp is a 32 year old wnl weight presents to establish care. She voices no problems or concerns.Patient has No headache, No chest pain, No abdominal pain - No Nausea, No new weakness tingling or numbness, No Cough - shortness of breath . After reviewing PHQ she did admit to depression and anxiety- denies suicidal ideation, no visual or auditory hallucinations.    Outpatient Encounter Medications as of 05/25/2022  Medication Sig   Multiple Vitamin (MULTIVITAMIN) LIQD Take 5 mLs by mouth daily.   ELDERBERRY PO Take by mouth. (Patient not taking: Reported on 05/25/2022)   IRON PO Take 1 tablet by mouth daily. (Patient not taking: Reported on 05/25/2022)   [DISCONTINUED] ibuprofen (ADVIL,MOTRIN) 800 MG tablet Take 1 tablet (800 mg total) by mouth every 8 (eight) hours.   [DISCONTINUED] ondansetron (ZOFRAN ODT) 4 MG disintegrating tablet Take 1 tablet (4 mg total) by mouth every 8 (eight) hours as needed for nausea or vomiting.   [DISCONTINUED] oxyCODONE (OXY IR/ROXICODONE) 5 MG immediate release tablet Take 1-2 tablets (5-10 mg total) by mouth every 4 (four) hours as needed for moderate pain. (Patient not taking: Reported on 08/15/2019)   [DISCONTINUED] Prenatal Vit-Fe Fumarate-FA (PRENATAL MULTIVITAMIN) TABS tablet Take 1 tablet by mouth daily at 12 noon.   No facility-administered encounter medications on file as of 05/25/2022.    Past Medical History:  Diagnosis Date   Abnormal Pap smear    Allergy    Anemia    Herpes    Late prenatal care    Ovarian cyst    PID (pelvic inflammatory disease)    Urinary tract infection     Past Surgical History:  Procedure Laterality Date   CESAREAN SECTION N/A 04/10/2018   Procedure: CESAREAN SECTION;  Surgeon:  Paula Compton, MD;  Location: [place];  Service: Obstetrics;  Laterality: N/A;   DILATION AND CURETTAGE OF UTERUS  2009    Family History  Problem Relation Age of Onset   Hypertension Maternal Grandmother    Hyperlipidemia Maternal Grandmother    Heart disease Maternal Grandmother        enlarged heart   Diabetes Maternal Grandfather    Diabetes Maternal Uncle    Hypertension Maternal Uncle    Kidney disease Maternal Uncle    Cancer Paternal Aunt        lung   Cancer Paternal Uncle        lung   Anesthesia problems Neg Hx     Social History   Socioeconomic History   Marital status: Single    Spouse name: Not on file   Number of children: Not on file   Years of education: Not on file   Highest education level: Not on file  Occupational History   Not on file  Tobacco Use   Smoking status: Some Days    Packs/day: 0.50    Types: Cigarettes    Last attempt to quit: 10/21/2010    Years since quitting: 11.6   Smokeless tobacco: Current  Vaping Use   Vaping Use: Never used  Substance and Sexual Activity   Alcohol use: Not Currently   Drug use: Not Currently    Types: Marijuana    Comment: occasionally  Sexual activity: Yes    Birth control/protection: None  Other Topics Concern   Not on file  Social History Narrative   Not on file   Social Determinants of Health   Financial Resource Strain: Not on file  Food Insecurity: No Food Insecurity (08/15/2019)   Hunger Vital Sign    Worried About Running Out of Food in the Last Year: Never true    Ran Out of Food in the Last Year: Never true  Transportation Needs: No Transportation Needs (08/15/2019)   PRAPARE - Hydrologist (Medical): No    Lack of Transportation (Non-Medical): No  Physical Activity: Not on file  Stress: Not on file  Social Connections: Not on file  Intimate Partner Violence: Not on file    ROS Comprehensive ROS Pertinent positive and negative noted in HPI        Objective    Blood Pressure 113/74   Pulse 92   Respiration 16   Height 5\' 3"  (1.6 m)   Weight 130 lb (59 kg)   Last Menstrual Period 04/14/2022 (Exact Date)   Oxygen Saturation 96%   Body Mass Index 23.03 kg/m   Physical Exam Vitals reviewed.  Constitutional:      Appearance: Normal appearance. She is normal weight.  HENT:     Head: Normocephalic.     Right Ear: Tympanic membrane and external ear normal.     Left Ear: Tympanic membrane and external ear normal.     Nose: Nose normal.  Eyes:     Extraocular Movements: Extraocular movements intact.     Pupils: Pupils are equal, round, and reactive to light.  Cardiovascular:     Rate and Rhythm: Normal rate and regular rhythm.  Pulmonary:     Effort: Pulmonary effort is normal.     Breath sounds: Normal breath sounds.  Abdominal:     General: Bowel sounds are normal.     Palpations: Abdomen is soft.  Musculoskeletal:        General: Normal range of motion.     Cervical back: Normal range of motion.  Skin:    General: Skin is warm and dry.  Neurological:     Mental Status: She is alert and oriented to person, place, and time.  Psychiatric:        Mood and Affect: Mood normal.        Behavior: Behavior normal.        Assessment & Plan:  [name] was seen today for new patient (initial visit).  Diagnoses and all orders for this visit:  Encounter to establish care -     CBC with Differential; Future  Adjustment disorder with mixed anxiety and depressed mood Refer to clinical social worker  Menorrhagia with irregular cycle -     CBC with Differential; Future -     CMP14+EGFR; Future    Kerin Perna, NP

## 2022-05-27 ENCOUNTER — Other Ambulatory Visit (INDEPENDENT_AMBULATORY_CARE_PROVIDER_SITE_OTHER): Payer: Medicaid Other

## 2022-05-27 ENCOUNTER — Other Ambulatory Visit (INDEPENDENT_AMBULATORY_CARE_PROVIDER_SITE_OTHER): Payer: Self-pay

## 2022-05-27 DIAGNOSIS — N921 Excessive and frequent menstruation with irregular cycle: Secondary | ICD-10-CM | POA: Diagnosis not present

## 2022-05-27 DIAGNOSIS — Z7689 Persons encountering health services in other specified circumstances: Secondary | ICD-10-CM

## 2022-05-28 LAB — CBC WITH DIFFERENTIAL/PLATELET
Basophils Absolute: 0 10*3/uL (ref 0.0–0.2)
Basos: 1 %
EOS (ABSOLUTE): 0.1 10*3/uL (ref 0.0–0.4)
Eos: 3 %
Hematocrit: 38.2 % (ref 34.0–46.6)
Hemoglobin: 12.5 g/dL (ref 11.1–15.9)
Immature Grans (Abs): 0 10*3/uL (ref 0.0–0.1)
Immature Granulocytes: 0 %
Lymphocytes Absolute: 1.4 10*3/uL (ref 0.7–3.1)
Lymphs: 34 %
MCH: 27.9 pg (ref 26.6–33.0)
MCHC: 32.7 g/dL (ref 31.5–35.7)
MCV: 85 fL (ref 79–97)
Monocytes Absolute: 0.4 10*3/uL (ref 0.1–0.9)
Monocytes: 10 %
Neutrophils Absolute: 2.3 10*3/uL (ref 1.4–7.0)
Neutrophils: 52 %
Platelets: 283 10*3/uL (ref 150–450)
RBC: 4.48 x10E6/uL (ref 3.77–5.28)
RDW: 13.2 % (ref 11.7–15.4)
WBC: 4.2 10*3/uL (ref 3.4–10.8)

## 2022-05-28 LAB — CMP14+EGFR
ALT: 44 IU/L — ABNORMAL HIGH (ref 0–32)
AST: 34 IU/L (ref 0–40)
Albumin/Globulin Ratio: 2.4 — ABNORMAL HIGH (ref 1.2–2.2)
Albumin: 4.8 g/dL (ref 3.9–4.9)
Alkaline Phosphatase: 55 IU/L (ref 44–121)
BUN/Creatinine Ratio: 11 (ref 9–23)
BUN: 8 mg/dL (ref 6–20)
Bilirubin Total: 0.4 mg/dL (ref 0.0–1.2)
CO2: 19 mmol/L — ABNORMAL LOW (ref 20–29)
Calcium: 9.6 mg/dL (ref 8.7–10.2)
Chloride: 109 mmol/L — ABNORMAL HIGH (ref 96–106)
Creatinine, Ser: 0.71 mg/dL (ref 0.57–1.00)
Globulin, Total: 2 g/dL (ref 1.5–4.5)
Glucose: 94 mg/dL (ref 70–99)
Potassium: 3.9 mmol/L (ref 3.5–5.2)
Sodium: 143 mmol/L (ref 134–144)
Total Protein: 6.8 g/dL (ref 6.0–8.5)
eGFR: 115 mL/min/{1.73_m2} (ref >59)

## 2022-05-28 LAB — SPECIMEN STATUS REPORT

## 2022-06-01 ENCOUNTER — Other Ambulatory Visit (INDEPENDENT_AMBULATORY_CARE_PROVIDER_SITE_OTHER): Payer: Self-pay | Admitting: Primary Care

## 2022-06-01 ENCOUNTER — Telehealth: Payer: Self-pay | Admitting: Emergency Medicine

## 2022-06-01 DIAGNOSIS — F4323 Adjustment disorder with mixed anxiety and depressed mood: Secondary | ICD-10-CM

## 2022-06-01 NOTE — Telephone Encounter (Signed)
Copied from [place]. Topic: General - Inquiry >> Jun 01, 2022  4:29 PM Santiya F wrote: Reason for CRM: Patient called in to follow up on referral for a psychiatrist

## 2022-06-14 DIAGNOSIS — Z3042 Encounter for surveillance of injectable contraceptive: Secondary | ICD-10-CM | POA: Diagnosis not present

## 2022-06-25 ENCOUNTER — Telehealth: Payer: Self-pay | Admitting: Primary Care

## 2022-06-25 NOTE — Telephone Encounter (Signed)
i called patient today to introduce Kristin Kemp and to assess patients' mental health needs. Patient did not answer the phone. I was able to leave a brief message with the patient asking them to return the call.

## 2022-06-25 NOTE — Telephone Encounter (Signed)
-----   Message from Shann Medal,  sent at 06/03/2022 11:00 AM EST ----- Regarding: advice Please follow up and evaluate clients needs from notes and client. Provide resources as needed.  ----- Message ----- From: Kerin Perna, NP Sent: 05/25/2022   4:33 PM EST To: Shann Medal, LCSW  Patient would benefit from LCSW intervention
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
## 2022-06-25 NOTE — Telephone Encounter (Signed)
Please call.

## 2022-07-06 ENCOUNTER — Ambulatory Visit: Payer: Medicaid Other | Admitting: Licensed Clinical Social Worker

## 2022-07-26 ENCOUNTER — Encounter (HOSPITAL_COMMUNITY): Payer: Self-pay

## 2022-07-26 ENCOUNTER — Ambulatory Visit (INDEPENDENT_AMBULATORY_CARE_PROVIDER_SITE_OTHER): Payer: Medicaid Other | Admitting: Mental Health

## 2022-07-26 DIAGNOSIS — F320 Major depressive disorder, single episode, mild: Secondary | ICD-10-CM

## 2022-07-26 DIAGNOSIS — Z720 Tobacco use: Secondary | ICD-10-CM

## 2022-07-26 DIAGNOSIS — F411 Generalized anxiety disorder: Secondary | ICD-10-CM

## 2022-07-28 DIAGNOSIS — F320 Major depressive disorder, single episode, mild: Secondary | ICD-10-CM | POA: Insufficient documentation

## 2022-07-28 DIAGNOSIS — F411 Generalized anxiety disorder: Secondary | ICD-10-CM | POA: Insufficient documentation

## 2022-07-28 DIAGNOSIS — Z720 Tobacco use: Secondary | ICD-10-CM | POA: Insufficient documentation
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
## 2022-08-30 ENCOUNTER — Ambulatory Visit (INDEPENDENT_AMBULATORY_CARE_PROVIDER_SITE_OTHER): Payer: Medicaid Other | Admitting: Mental Health

## 2022-08-30 DIAGNOSIS — F411 Generalized anxiety disorder: Secondary | ICD-10-CM

## 2022-08-30 DIAGNOSIS — F320 Major depressive disorder, single episode, mild: Secondary | ICD-10-CM

## 2022-08-30 NOTE — Progress Notes (Signed)
THERAPIST PROGRESS NOTE Virtual Visit via Video Note  I connected with Kristin Kemp on 08/30/22 at  1:00 PM EDT by a video enabled telemedicine application and verified that I am speaking with the correct person using two identifiers.  Location: Patient: Parking lot of daycare Provider: home office   I discussed the limitations of evaluation and management by telemedicine and the availability of in person appointments. The patient expressed understanding and agreed to proceed.   I discussed the assessment and treatment plan with the patient. The patient was provided an opportunity to ask questions and all were answered. The patient agreed with the plan and demonstrated an understanding of the instructions.   The patient was advised to call back or seek an in-person evaluation if the symptoms worsen or if the condition fails to improve as anticipated.  I provided 40 minutes of non-face-to-face time during this encounter.   Dorris Singh, [facility]   Session Time: 1:03 pm ( 40 minutes)  Participation Level: Active  Behavioral Response: CasualWNL  Type of Therapy: Individual Therapy  Treatment Goals addressed: STG: "Better coping skills." Kristin "Tisha" will increase management of anxiety/stressors AEB development of effective coping skills with ability to engage in self-care weekly within the next 90 days     ProgressTowards Goals: Initial  Interventions: CBT and Supportive  Summary: Kristin Kemp is a 32 y.o. female who presents with dx of Major depression mild and generalized anxiety. Presents alert and oriented; mood and affect adequate. Speech clear and coherent at normal rate and tone. Shares to be doing well and for moods to have been stable. Chief complaint of feelings of anxiousness and worry related to raising daughter and daughter getting in trouble in school and being inappropriate with her phone. Shares has taken daughters phone but worries about  daughter being bullied for not having things and shares she does not want her to have a childhood like hers. Shares for family to be supportive. Notes to have recently became engaged and almost able to move back into her home. On break from school and agrees to work on increasing ability to attend to self-care. Notes difficulty saying no and for this to contribute to increase stressors. Receptive of mindfulness walking and exploration of internal dialogue and contribution of feelings of anxiety. Denies SI/HI. Initial treatment plan. Depression reported to be stable with ongoing anxiety.   GAD: 12 (previously 10)  Suicidal/Homicidal: Nowithout intent/plan  Therapist Response: Therapist engaged Kristin Kemp in tele-therapy session. Assessed for current location and confidentiality of session. Reviewed intake assessment and provided safe space for Kristin Kemp to share current stressors. Assessed current level of stressors, sxs management and level of functioning. Explored ability to increase boundaries with self and others and ability to put self first as needed and increasing time boundaries with others. Supported in processing parenting concerns and stressors. Provided education on internal dialogue and how thoughts contribute to feelings of anxiety. Encouraged engaging in self-care practices regularly and ability to be mindful of self-talk. Reviewed session. Provided follow up. Assessed for safety.   Plan: Return again in 4 weeks.  Diagnosis: Mild major depression (HCC)  Generalized anxiety disorder  Collaboration of Care: Other None  Patient/Guardian was advised Release of Information must be obtained prior to any record release in order to collaborate their care with an outside provider. Patient/Guardian was advised if they have not already done so to contact the registration department to sign all necessary forms in order for [place] to release information  regarding their care.   Consent: Patient/Guardian  gives verbal consent for treatment and assignment of benefits for services provided during this visit. Patient/Guardian expressed understanding and agreed to proceed.   Stephan Minister [place], [facility] 08/30/2022

## 2022-08-31 DIAGNOSIS — Z3042 Encounter for surveillance of injectable contraceptive: Secondary | ICD-10-CM | POA: Diagnosis not present

## 2022-10-04 ENCOUNTER — Ambulatory Visit (INDEPENDENT_AMBULATORY_CARE_PROVIDER_SITE_OTHER): Payer: Medicaid Other | Admitting: Mental Health

## 2022-10-04 DIAGNOSIS — F320 Major depressive disorder, single episode, mild: Secondary | ICD-10-CM | POA: Diagnosis not present

## 2022-10-04 DIAGNOSIS — F411 Generalized anxiety disorder: Secondary | ICD-10-CM
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
## 2022-11-08 ENCOUNTER — Ambulatory Visit (INDEPENDENT_AMBULATORY_CARE_PROVIDER_SITE_OTHER): Payer: Medicaid Other | Admitting: Mental Health

## 2022-11-08 DIAGNOSIS — F320 Major depressive disorder, single episode, mild: Secondary | ICD-10-CM | POA: Diagnosis not present

## 2022-11-08 DIAGNOSIS — F411 Generalized anxiety disorder: Secondary | ICD-10-CM
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
## 2022-11-22 DIAGNOSIS — Z3042 Encounter for surveillance of injectable contraceptive: Secondary | ICD-10-CM | POA: Diagnosis not present

## 2022-11-29 ENCOUNTER — Ambulatory Visit (INDEPENDENT_AMBULATORY_CARE_PROVIDER_SITE_OTHER): Payer: Medicaid Other | Admitting: Primary Care

## 2022-12-08 ENCOUNTER — Encounter (INDEPENDENT_AMBULATORY_CARE_PROVIDER_SITE_OTHER): Payer: Self-pay | Admitting: Primary Care

## 2022-12-08 ENCOUNTER — Ambulatory Visit (INDEPENDENT_AMBULATORY_CARE_PROVIDER_SITE_OTHER): Payer: Medicaid Other | Admitting: Primary Care

## 2022-12-08 VITALS — BP 114/75 | HR 103 | Resp 16 | Wt 147.0 lb

## 2022-12-08 DIAGNOSIS — Z0000 Encounter for general adult medical examination without abnormal findings: Secondary | ICD-10-CM | POA: Diagnosis not present

## 2022-12-09 ENCOUNTER — Ambulatory Visit (INDEPENDENT_AMBULATORY_CARE_PROVIDER_SITE_OTHER): Payer: Medicaid Other

## 2022-12-09 DIAGNOSIS — Z020 Encounter for examination for admission to educational institution: Secondary | ICD-10-CM

## 2022-12-12 LAB — QUANTIFERON-TB GOLD PLUS
QuantiFERON Mitogen Value: >10 IU/mL
QuantiFERON Nil Value: 0.01 [IU]/mL
QuantiFERON TB1 Ag Value: 0.01 [IU]/mL
QuantiFERON TB2 Ag Value: 0.03 [IU]/mL
QuantiFERON-TB Gold Plus: NEGATIVE

## 2022-12-12 LAB — HEPATITIS B SURFACE ANTIBODY, QUANTITATIVE: Hepatitis B Surf Ab Quant: 9.9 m[IU]/mL — ABNORMAL LOW

## 2022-12-13 NOTE — Progress Notes (Unsigned)
[facility]  Kristin Kemp is a 32 y.o. female presents to office today for annual physical exam examination.    Concerns today include: 1. none Past Medical History:  Diagnosis Date   Abnormal Pap smear    Allergy    Anemia    Herpes    Late prenatal care    Ovarian cyst    PID (pelvic inflammatory disease)    Urinary tract infection     Health Maintenance  Topic Date Due   COVID-19 Vaccine (1 - 2023-24 season) Never done   PAP SMEAR-Modifier  08/15/2022   INFLUENZA VACCINE  11/25/2022   OPHTHALMOLOGY EXAM  12/23/2022   DTaP/Tdap/Td (3 - Td or Tdap) 02/11/2028   Hepatitis C Screening  Completed   HIV Screening  Completed   HPV VACCINES  Aged Out     Past Medical History:  Diagnosis Date   Abnormal Pap smear    Allergy    Anemia    Herpes    Late prenatal care    Ovarian cyst    PID (pelvic inflammatory disease)    Urinary tract infection    Social History   Socioeconomic History   Marital status: Single    Spouse name: Not on file   Number of children: Not on file   Years of education: Not on file   Highest education level: Not on file  Occupational History   Not on file  Tobacco Use   Smoking status: Some Days    Current packs/day: 0.00    Types: Cigarettes    Last attempt to quit: 10/21/2010    Years since quitting: 12.1   Smokeless tobacco: Current  Vaping Use   Vaping status: Never Used  Substance and Sexual Activity   Alcohol use: Not Currently   Drug use: Not Currently    Types: Marijuana    Comment: occasionally   Sexual activity: Yes    Birth control/protection: None  Other Topics Concern   Not on file  Social History Narrative   Not on file   Social Determinants of Health   Financial Resource Strain: Medium Risk (07/26/2022)   Overall Financial Resource Strain (CARDIA)    Difficulty of Paying Living Expenses: Somewhat hard  Food Insecurity: No Food Insecurity (08/15/2019)   Hunger Vital Sign    Worried About  Running Out of Food in the Last Year: Never true    Ran Out of Food in the Last Year: Never true  Transportation Needs: No Transportation Needs (08/15/2019)   PRAPARE - Administrator, Civil Service (Medical): No    Lack of Transportation (Non-Medical): No  Physical Activity: Inactive (07/26/2022)   Exercise Vital Sign    Days of Exercise per Week: 0 days    Minutes of Exercise per Session: 0 min  Stress: Stress Concern Present (07/26/2022)   [employer] of Occupational Health - Occupational Stress Questionnaire    Feeling of Stress : Rather much  Social Connections: Socially Integrated (07/26/2022)   Social Connection and Isolation Panel [NHANES]    Frequency of Communication with Friends and Family: More than three times a week    Frequency of Social Gatherings with Friends and Family: Once a week    Attends Religious Services: More than 4 times per year    Active Member of [employer] or Organizations: Yes    Attends Engineer, structural: More than 4 times per year    Marital Status: Living with partner  Intimate Partner  Violence: Not At Risk (07/26/2022)   Humiliation, Afraid, Rape, and Kick questionnaire    Fear of Current or Ex-Partner: No    Emotionally Abused: No    Physically Abused: No    Sexually Abused: No   Past Surgical History:  Procedure Laterality Date   CESAREAN SECTION N/A 04/10/2018   Procedure: CESAREAN SECTION;  Surgeon: Huel Cote, MD;  Location: [facility] BIRTHING SU[facility];  Service: Obstetrics;  Laterality: N/A;   DILATION AND CURETTAGE OF UTERUS  2009   Family History  Problem Relation Age of Onset   Hypertension Maternal Grandmother    Hyperlipidemia Maternal Grandmother    Heart disease Maternal Grandmother        enlarged heart   Diabetes Maternal Grandfather    Diabetes Maternal Uncle    Hypertension Maternal Uncle    Kidney disease Maternal Uncle    Cancer Paternal Aunt        lung   Cancer Paternal Uncle        lung   Anesthesia  problems Neg Hx     Current Outpatient Medications:    ELDERBERRY PO, Take by mouth. (Patient not taking: Reported on 05/25/2022), Disp: , Rfl:    IRON PO, Take 1 tablet by mouth daily. (Patient not taking: Reported on 05/25/2022), Disp: , Rfl:    Multiple Vitamin (MULTIVITAMIN) LIQD, Take 5 mLs by mouth daily., Disp: , Rfl:  Outpatient Encounter Medications as of 12/08/2022  Medication Sig   ELDERBERRY PO Take by mouth. (Patient not taking: Reported on 05/25/2022)   IRON PO Take 1 tablet by mouth daily. (Patient not taking: Reported on 05/25/2022)   Multiple Vitamin (MULTIVITAMIN) LIQD Take 5 mLs by mouth daily.   No facility-administered encounter medications on file as of 12/08/2022.    Allergies  Allergen Reactions   Chocolate Hives and Itching   Cinnamon Other (See Comments)    Turns tongue raw.   Latex Other (See Comments)    Reports vaginal sensitivity after latex condom use.     ROS: Review of Systems A comprehensive review of systems was negative.    Physical exam General: No apparent distress. Eyes: Extraocular eye movements intact, pupils equal and round. Neck: Supple, trachea midline. Thyroid: No enlargement, mobile without fixation, no tenderness. Cardiovascular: Regular rhythm and rate, no murmur, normal radial pulses. Respiratory: Normal respiratory effort, clear to auscultation. Gastrointestinal: Normal pitch active bowel sounds, nontender abdomen without distention or appreciable hepatomegaly. Musculoskeletal: Normal muscle tone, no tenderness on palpation of tibia, no excessive thoracic kyphosis. Skin: Appropriate warmth, no visible rash. Mental status: Alert, conversant, speech clear, thought logical, appropriate mood and affect, no hallucinations or delusions evident. Hematologic/lymphatic: No cervical adenopathy, no visible ecchymoses.    Assessment/ Plan: Kristin Kemp here for annual physical exam.  Kristin "Morrison Old" was seen today for annual  exam.  Diagnoses and all orders for this visit:  Annual physical exam    Counseled on healthy lifestyle choices, including diet (rich in fruits, vegetables and lean meats and low in salt and simple carbohydrates) and exercise (at least 30 minutes of moderate physical activity daily).  Patient to follow up in 1 year for annual exam or sooner if needed.  The above assessment and management plan was discussed with the patient. The patient verbalized understanding of and has agreed to the management plan. Patient is aware to call the clinic if symptoms persist or worsen. Patient is aware when to return to the clinic for a follow-up visit. Patient educated on when  it is appropriate to go to the emergency department.   This note has been created with Education officer, environmental. Any transcriptional errors are unintentional.   Grayce Sessions, NP 12/13/2022, 5:24 PM

## 2022-12-20 ENCOUNTER — Ambulatory Visit (HOSPITAL_COMMUNITY): Payer: Medicaid Other | Admitting: Mental Health

## 2022-12-23 ENCOUNTER — Encounter (HOSPITAL_BASED_OUTPATIENT_CLINIC_OR_DEPARTMENT_OTHER): Payer: Self-pay

## 2022-12-23 ENCOUNTER — Emergency Department (HOSPITAL_COMMUNITY)
Admission: EM | Admit: 2022-12-23 | Discharge: 2022-12-23 | Discharge status: Against Medical Advice | Payer: Medicaid Other | Attending: Emergency Medicine | Admitting: Emergency Medicine

## 2022-12-23 ENCOUNTER — Emergency Department (HOSPITAL_BASED_OUTPATIENT_CLINIC_OR_DEPARTMENT_OTHER)
Admission: EM | Admit: 2022-12-23 | Discharge: 2022-12-23 | Disposition: A | Discharge status: Home / Self Care | Payer: Medicaid Other | Source: Home / Self Care | Attending: Emergency Medicine | Admitting: Emergency Medicine

## 2022-12-23 ENCOUNTER — Encounter (HOSPITAL_COMMUNITY): Payer: Self-pay

## 2022-12-23 ENCOUNTER — Other Ambulatory Visit: Payer: Self-pay

## 2022-12-23 DIAGNOSIS — R42 Dizziness and giddiness: Secondary | ICD-10-CM | POA: Insufficient documentation

## 2022-12-23 DIAGNOSIS — E860 Dehydration: Secondary | ICD-10-CM | POA: Diagnosis not present

## 2022-12-23 DIAGNOSIS — Z5321 Procedure and treatment not carried out due to patient leaving prior to being seen by health care provider: Secondary | ICD-10-CM | POA: Diagnosis not present

## 2022-12-23 DIAGNOSIS — R110 Nausea: Secondary | ICD-10-CM | POA: Insufficient documentation

## 2022-12-23 LAB — HEPATIC FUNCTION PANEL
ALT: 28 U/L (ref 0–44)
AST: 20 U/L (ref 15–41)
Albumin: 4 g/dL (ref 3.5–5.0)
Alkaline Phosphatase: 40 U/L (ref 38–126)
Bilirubin, Direct: <0.1 mg/dL (ref 0.0–0.2)
Total Bilirubin: 0.5 mg/dL (ref 0.3–1.2)
Total Protein: 6.8 g/dL (ref 6.5–8.1)

## 2022-12-23 LAB — CBC
HCT: 38.4 % (ref 36.0–46.0)
Hemoglobin: 13.1 g/dL (ref 12.0–15.0)
MCH: 29 pg (ref 26.0–34.0)
MCHC: 34.1 g/dL (ref 30.0–36.0)
MCV: 85.1 fL (ref 80.0–100.0)
Platelets: 335 10*3/uL (ref 150–400)
RBC: 4.51 MIL/uL (ref 3.87–5.11)
RDW: 14.3 % (ref 11.5–15.5)
WBC: 6.4 10*3/uL (ref 4.0–10.5)
nRBC: 0 % (ref 0.0–0.2)

## 2022-12-23 LAB — BASIC METABOLIC PANEL
Anion gap: 10 (ref 5–15)
BUN: 10 mg/dL (ref 6–20)
CO2: 22 mmol/L (ref 22–32)
Calcium: 9.3 mg/dL (ref 8.9–10.3)
Chloride: 105 mmol/L (ref 98–111)
Creatinine, Ser: 0.74 mg/dL (ref 0.44–1.00)
GFR, Estimated: >60 mL/min (ref >60)
Glucose, Bld: 110 mg/dL — ABNORMAL HIGH (ref 70–99)
Potassium: 3.6 mmol/L (ref 3.5–5.1)
Sodium: 137 mmol/L (ref 135–145)

## 2022-12-23 LAB — URINALYSIS, ROUTINE W REFLEX MICROSCOPIC
Bacteria, UA: NONE SEEN
Bilirubin Urine: NEGATIVE
Glucose, UA: NEGATIVE mg/dL
Hgb urine dipstick: NEGATIVE
Ketones, ur: NEGATIVE mg/dL
Leukocytes,Ua: NEGATIVE
Nitrite: NEGATIVE
Protein, ur: NEGATIVE mg/dL
Specific Gravity, Urine: 1.012 (ref 1.005–1.030)
pH: 7 (ref 5.0–8.0)

## 2022-12-23 LAB — HCG, SERUM, QUALITATIVE: Preg, Serum: NEGATIVE

## 2022-12-23 LAB — MAGNESIUM: Magnesium: 1.9 mg/dL (ref 1.7–2.4)

## 2022-12-23 MED ORDER — SODIUM CHLORIDE 0.9 % IV BOLUS
1000.0000 mL | Freq: Once | INTRAVENOUS | Status: AC
  Administered 2022-12-23: 1000 mL via INTRAVENOUS

## 2022-12-23 MED ORDER — ONDANSETRON HCL 4 MG/2ML IJ SOLN
4.0000 mg | Freq: Once | INTRAMUSCULAR | Status: AC
  Administered 2022-12-23: 4 mg via INTRAVENOUS
  Filled 2022-12-23: qty 2

## 2022-12-23 MED ORDER — ONDANSETRON 4 MG PO TBDP: 4.0000 mg | ORAL_TABLET | Freq: Three times a day (TID) | ORAL | 0 refills | Status: DC | PRN

## 2022-12-23 NOTE — ED Notes (Signed)
Pt stated they needed to leave to pick their child up and pt was encouraged to stay. Pt left and was taken OTF.

## 2022-12-23 NOTE — Discharge Instructions (Addendum)
Please read and follow all provided instructions.  Your diagnoses today include:  1. Lightheadedness   2. Nausea     Tests performed today include: Blood cell counts and platelets Kidney and liver function tests Pancreas function test (called lipase) Urine test to look for infection A blood or urine test for pregnancy (women only) Vital signs. See below for your results today.   Medications prescribed:  Zofran (ondansetron) - for nausea and vomiting  Take any prescribed medications only as directed.  Home care instructions:  Follow any educational materials contained in this packet.  Follow-up instructions: Please follow-up with your primary care provider in the next 3 days for further evaluation of your symptoms.    Return instructions:  SEEK IMMEDIATE MEDICAL ATTENTION IF: The pain does not go away or becomes severe  A temperature above 101F develops  Repeated vomiting occurs (multiple episodes)  The pain becomes localized to portions of the abdomen. The right side could possibly be appendicitis. In an adult, the left lower portion of the abdomen could be colitis or diverticulitis.  Blood is being passed in stools or vomit (bright red or black tarry stools)  You develop chest pain, difficulty breathing, dizziness or fainting, or become confused, poorly responsive, or inconsolable (young children) If you have any other emergent concerns regarding your health  Additional Information: Abdominal (belly) pain can be caused by many things. Your caregiver performed an examination and possibly ordered blood/urine tests and imaging (CT scan, x-rays, ultrasound). Many cases can be observed and treated at home after initial evaluation in the emergency department. Even though you are being discharged home, abdominal pain can be unpredictable. Therefore, you need a repeated exam if your pain does not resolve, returns, or worsens. Most patients with abdominal pain don't have to be admitted  to the hospital or have surgery, but serious problems like appendicitis and gallbladder attacks can start out as nonspecific pain. Many abdominal conditions cannot be diagnosed in one visit, so follow-up evaluations are very important.  Your vital signs today were: BP 124/84   Pulse 84   Temp 98.4 F (36.9 C) (Oral)   Resp (!) 22   SpO2 100%  If your blood pressure (bp) was elevated above 135/85 this visit, please have this repeated by your doctor within one month. --------------

## 2022-12-23 NOTE — ED Provider Notes (Signed)
[place] [facility] Provider Note   CSN: [id number] Arrival date & time: 12/23/22  1624     History  Chief Complaint  Patient presents with   Dizziness    Kristin Kemp is a 32 y.o. female.  Patient presents to the emergency department today for evaluation of generalized lightheadedness.  Patient believes that she had a foodborne illness 2 days ago.  She states that an hour after having lunch she developed nausea, vomiting, and diarrhea.  She had some abdominal cramping which has resolved.  She has not had any vomiting or diarrhea in the past 24 hours.  She has had generalized weakness and lightheadedness with residual nausea.  No full syncope.  No weakness, numbness, or tingling in the arms of the legs.  She went to [facility] earlier today where she had labs drawn but had to leave prior to being seen.  Lab workup reviewed.  Liver function and magnesium were not sent and will be added.       Home Medications Prior to Admission medications   Medication Sig Start Date End Date Taking? Authorizing Provider  Multiple Vitamin (MULTIVITAMIN) LIQD Take 5 mLs by mouth daily.    [provider]      Allergies    Chocolate, Cinnamon, and Latex    Review of Systems   Review of Systems  Physical Exam Updated Vital Signs BP (!) 144/102 (BP Location: Right Arm)   Pulse 85   Temp 98.4 F (36.9 C) (Oral)   Resp 16   SpO2 100%  Physical Exam Vitals and nursing note reviewed.  Constitutional:      General: She is not in acute distress.    Appearance: She is well-developed.  HENT:     Head: Normocephalic and atraumatic.     Right Ear: External ear normal.     Left Ear: External ear normal.     Nose: Nose normal.     Mouth/Throat:     Mouth: Mucous membranes are moist.  Eyes:     Conjunctiva/sclera: Conjunctivae normal.  Cardiovascular:     Rate and Rhythm: Normal rate and regular rhythm.     Heart sounds: No murmur  heard. Pulmonary:     Effort: No respiratory distress.     Breath sounds: No wheezing, rhonchi or rales.  Abdominal:     Palpations: Abdomen is soft.     Tenderness: There is no abdominal tenderness. There is no guarding or rebound.  Musculoskeletal:     Cervical back: Normal range of motion and neck supple.     Right lower leg: No edema.     Left lower leg: No edema.  Skin:    General: Skin is warm and dry.     Findings: No rash.  Neurological:     General: No focal deficit present.     Mental Status: She is alert. Mental status is at baseline.     Motor: No weakness.  Psychiatric:        Mood and Affect: Mood normal.     ED Results / Procedures / Treatments   Labs (all labs ordered are listed, but only abnormal results are displayed) Labs Reviewed  HEPATIC FUNCTION PANEL  MAGNESIUM    EKG None  Radiology No results found.  Procedures Procedures    Medications Ordered in ED Medications  ondansetron (ZOFRAN) injection 4 mg (has no administration in time range)  sodium chloride 0.9 % bolus 1,000 mL (0 mLs  Intravenous Stopped 12/23/22 1826)    ED Course/ Medical Decision Making/ A&P    Patient seen and examined. History obtained directly from patient. Lab workup reviewed from earlier visit today.  Liver function and magnesium were not sent and will be added.  Labs/EKG: Ordered magnesium, hepatic function panel  Imaging: None ordered  Medications/Fluids: Ordered: IV fluid bolus  Most recent vital signs reviewed and are as follows: BP (!) 144/102 (BP Location: Right Arm)   Pulse 85   Temp 98.4 F (36.9 C) (Oral)   Resp 16   SpO2 100%   Initial impression: Mild dehydration after recent nausea, vomiting, and diarrhea episode.  Pattern has been thus far consistent with gastroenteritis or foodborne illness and seems to be resolving.  Abdominal exam reassuring.  6:52 PM Reassessment performed. Patient appears well.  Exam unchanged, abdomen soft and nontender.   She is feeling better after IV fluids.  Still with some mild nausea.  IV Zofran ordered.  Labs personally reviewed and interpreted including: CBC, BMP, UA, pregnancy ordered previously or normal other than minimally elevated blood sugar.  Hepatic function panel and magnesium here were normal.  Reviewed pertinent lab work and imaging with patient at bedside. Questions answered.   Most current vital signs reviewed and are as follows: BP 124/84   Pulse 84   Temp 98.4 F (36.9 C) (Oral)   Resp (!) 22   SpO2 100%   Plan: Discharge to home.   Prescriptions written for: ODT Zofran  Other home care instructions discussed: [place]land diet, rest, hydration  ED return instructions discussed: The patient was urged to return to the Emergency Department immediately with worsening of current symptoms, worsening abdominal pain, persistent vomiting, blood noted in stools, fever, or any other concerns. The patient verbalized understanding.   Follow-up instructions discussed: Patient encouraged to follow-up with their PCP in 3 days if symptoms not resolved.                                 Medical Decision Making Amount and/or Complexity of Data Reviewed Labs: ordered.  Risk Prescription drug management.   For this patient's complaint of abdominal pain, the following conditions were considered on the differential diagnosis: gastritis/PUD, enteritis/duodenitis, appendicitis, cholelithiasis/cholecystitis, cholangitis, pancreatitis, ruptured viscus, colitis, diverticulitis, small/large bowel obstruction, proctitis, cystitis, pyelonephritis, ureteral colic, aortic dissection, aortic aneurysm. In women, ectopic pregnancy, pelvic inflammatory disease, ovarian cysts, and tubo-ovarian abscess were also considered. Atypical chest etiologies were also considered including ACS, PE, and pneumonia.  The patient's vital signs, pertinent lab work and imaging were reviewed and interpreted as discussed in the ED course.  Hospitalization was considered for further testing, treatments, or serial exams/observation. However as patient is well-appearing, has a stable exam, and reassuring studies today, I do not feel that they warrant admission at this time. This plan was discussed with the patient who verbalizes agreement and comfort with this plan and seems reliable and able to return to the Emergency Department with worsening or changing symptoms.          Final Clinical Impression(s) / ED Diagnoses Final diagnoses:  Lightheadedness  Nausea    Rx / DC Orders ED Discharge Orders          Ordered    ondansetron (ZOFRAN-ODT) 4 MG disintegrating tablet  Every 8 hours PRN        12/23/22 1851  Renne Crigler, PA-C 12/23/22 Lynelle Smoke    Arby Barrette, MD 01/06/23 [phone]

## 2022-12-23 NOTE — ED Triage Notes (Addendum)
Pt reports to ED with complaints of feeling sick for a few days. Today has been worse and her vision is blurry and she is dizzy. Pt also reports nausea

## 2022-12-23 NOTE — ED Triage Notes (Signed)
Reports was sick a few days ago with food poison and is still having dizziness and lightheadedness. Denies other symptoms.

## 2023-01-27 ENCOUNTER — Ambulatory Visit (HOSPITAL_COMMUNITY): Payer: Medicaid Other | Admitting: Mental Health

## 2023-01-27 DIAGNOSIS — F320 Major depressive disorder, single episode, mild: Secondary | ICD-10-CM

## 2023-01-27 DIAGNOSIS — F411 Generalized anxiety disorder: Secondary | ICD-10-CM

## 2023-01-27 NOTE — Progress Notes (Signed)
THERAPIST PROGRESS NOTE Virtual Visit via Video Note  I connected with Franne Forts on 01/30/23 at 11:00 AM EDT by a video enabled telemedicine application and verified that I am speaking with the correct person using two identifiers.  Location: Patient: [facility].  Provider: Office   I discussed the limitations of evaluation and management by telemedicine and the availability of in person appointments. The patient expressed understanding and agreed to proceed.  I discussed the assessment and treatment plan with the patient. The patient was provided an opportunity to ask questions and all were answered. The patient agreed with the plan and demonstrated an understanding of the instructions.   The patient was advised to call back or seek an in-person evaluation if the symptoms worsen or if the condition fails to improve as anticipated.  I provided 54 minutes of non-face-to-face time during this encounter.   Dorris Singh, [facility]   Session Time: 11:02 am ( 54 minutes)  Participation Level: Active  Behavioral Response: CasualAlertdysphoric  Type of Therapy: Individual Therapy  Treatment Goals addressed: STG: "Better coping skills." [place] "Tisha" will increase management of anxiety/stressors AEB development of effective coping skills with ability to engage in self-care weekly within the next 90 days    ProgressTowards Goals: Progressing  Interventions: CBT and Supportive  Summary: VEYDA KAUFMAN is a 32 y.o. female who presents with dx of Major depression mild and generalized anxiety. Presents alert and oriented; mood and affect low; dysphoric. Speech clear and coherent at normal rate and tone. Engaged and receptive to interventions. Notes ok with moods adequate. Shares chief compliant of increased anxiety and concern for lack of friendships. Shares episodes of feeling "panicky" noting this occurs while she is in class. Shares presence of negative  self talk. Difficulty with trusting self. Notes thoughts of current program to be strict with grades, although reports to be doing well. Shares to feel as if she has no friends and reports hx of being more engaging with others and more social. Shares previous natural supports to be been supports of negative influence and shares thoughts on current friendship and events of her wedding. Shares for current cohort to be clickly in the classroom and holds thoughts of "what is wrong with me. I am an outcast." Explores with therapist factors currently involved with current classmates and working to explore alternative thoughts and reframing. Agrees to work to incorporate positive affirmations and increasing awareness of negative self talk. Denies SI/HI. Ongoing progress with goals.  Suicidal/Homicidal: Nowithout intent/plan  Therapist Response: Therapist engaged [name] in tele-therapy session. Assessed for current location and confidentiality of session.  Provided safe space for Consandra to share current stressors. Assessed current level of stressors, sxs management and level of stressors. Active empathic listening; providing support and encouragement. Engaged Tisha in identifying distorted thoughts and ability to reframe. Guided discovery for exploration of factors that contribute to presence of friendships and natural supports. Explored various opportunities for increasing presence of natural supports. Reviewed attendance to self-care and encouraged being mindful of internal dialogue. Provided homework to practice positive affirmations x 2 daily. Reviewed session and provided follow up.   Plan: Return again in  x5 weeks.  Diagnosis: Mild major depression (HCC)  Generalized anxiety disorder  Collaboration of Care: Other None  Patient/Guardian was advised Release of Information must be obtained prior to any record release in order to collaborate their care with an outside provider. Patient/Guardian was advised  if they have not already done  so to contact the registration department to sign all necessary forms in order for [place] to release information regarding their care.   Consent: Patient/Guardian gives verbal consent for treatment and assignment of benefits for services provided during this visit. Patient/Guardian expressed understanding and agreed to proceed.   Stephan Minister [place], [facility] 01/27/2023

## 2023-02-01 ENCOUNTER — Encounter (HOSPITAL_COMMUNITY): Payer: Self-pay

## 2023-02-01 ENCOUNTER — Emergency Department (HOSPITAL_COMMUNITY)
Admission: EM | Admit: 2023-02-01 | Discharge: 2023-02-01 | Disposition: A | Discharge status: Home / Self Care | Payer: Medicaid Other | Attending: Emergency Medicine | Admitting: Emergency Medicine

## 2023-02-01 ENCOUNTER — Other Ambulatory Visit: Payer: Self-pay

## 2023-02-01 DIAGNOSIS — B9789 Other viral agents as the cause of diseases classified elsewhere: Secondary | ICD-10-CM | POA: Diagnosis not present

## 2023-02-01 DIAGNOSIS — Z91040 Latex allergy status: Secondary | ICD-10-CM | POA: Diagnosis not present

## 2023-02-01 DIAGNOSIS — J069 Acute upper respiratory infection, unspecified: Secondary | ICD-10-CM | POA: Diagnosis not present

## 2023-02-01 DIAGNOSIS — R0981 Nasal congestion: Secondary | ICD-10-CM | POA: Diagnosis present

## 2023-02-01 DIAGNOSIS — Z1152 Encounter for screening for COVID-19: Secondary | ICD-10-CM | POA: Diagnosis not present

## 2023-02-01 LAB — RESP PANEL BY RT-PCR (RSV, FLU A&B, COVID)  RVPGX2
Influenza A by PCR: NEGATIVE
Influenza B by PCR: NEGATIVE
Resp Syncytial Virus by PCR: NEGATIVE
SARS Coronavirus 2 by RT PCR: NEGATIVE

## 2023-02-01 NOTE — ED Provider Notes (Signed)
  [place] EMERGENCY DEPARTMENT AT [facility] Provider Note   CSN: [id number] Arrival date & time: 02/01/23  1126     History  Chief Complaint  Patient presents with   Nasal Congestion    Kristin Kemp is a 32 y.o. female.  Otherwise healthy 32 year old female here today for nasal congestion that began a couple of days ago.        Home Medications Prior to Admission medications   Medication Sig Start Date End Date Taking? Authorizing Provider  Multiple Vitamin (MULTIVITAMIN) LIQD Take 5 mLs by mouth daily.    [provider]  ondansetron (ZOFRAN-ODT) 4 MG disintegrating tablet Take 1 tablet (4 mg total) by mouth every 8 (eight) hours as needed for nausea or vomiting. 12/23/22   Renne Crigler, PA-C      Allergies    Chocolate, Cinnamon, and Latex    Review of Systems   Review of Systems  Physical Exam Updated Vital Signs BP (!) 144/94 (BP Location: Left Arm)   Pulse 95   Temp 98.6 F (37 C) (Oral)   Resp 18   Ht 5\' 3"  (1.6 m)   Wt 66.7 kg   SpO2 100%   BMI 26.05 kg/m  Physical Exam Vitals reviewed.  HENT:     Head: Normocephalic and atraumatic.     Nose: Congestion present.  Eyes:     Extraocular Movements: Extraocular movements intact.     Pupils: Pupils are equal, round, and reactive to light.  Cardiovascular:     Rate and Rhythm: Normal rate.  Pulmonary:     Effort: Pulmonary effort is normal.  Musculoskeletal:     Cervical back: Neck supple.  Neurological:     Mental Status: She is alert.     ED Results / Procedures / Treatments   Labs (all labs ordered are listed, but only abnormal results are displayed) Labs Reviewed  RESP PANEL BY RT-PCR (RSV, FLU A&B, COVID)  RVPGX2    EKG None  Radiology No results found.  Procedures Procedures    Medications Ordered in ED Medications - No data to display  ED Course/ Medical Decision Making/ A&P                                 Medical Decision  Making 32 year old female here today for nasal congestion.  Plan-patient's symptoms consistent with viral syndrome.  Overall looks well.  Normal oropharynx.  Nontoxic.  Viral swabs reviewed, negative.  Will discharge home.           Final Clinical Impression(s) / ED Diagnoses Final diagnoses:  Viral upper respiratory tract infection    Rx / DC Orders ED Discharge Orders     None         Anders Simmonds T, DO 02/01/23 1351

## 2023-02-01 NOTE — Discharge Instructions (Addendum)
You can you can continue taking medications at home.  Your symptoms should begin to improve over the next few days.

## 2023-02-01 NOTE — ED Triage Notes (Signed)
Nasal congestion with yellow mucus. Pt denies cough. States she has had this since Saturday. Pt woke up and felt dizzy and nauseous. Denies any medical hx

## 2023-02-03 DIAGNOSIS — Z23 Encounter for immunization: Secondary | ICD-10-CM | POA: Diagnosis not present

## 2023-02-10 DIAGNOSIS — Z3042 Encounter for surveillance of injectable contraceptive: Secondary | ICD-10-CM | POA: Diagnosis not present

## 2023-02-24 ENCOUNTER — Telehealth: Payer: Self-pay | Admitting: Primary Care

## 2023-02-24 NOTE — Telephone Encounter (Signed)
Copied from CRM [id number]. Topic: General - Other >> Feb 24, 2023  9:29 AM Macon Large wrote: Reason for CRM: Pt requests to have a second varicella shot. Cb# [phone]

## 2023-02-24 NOTE — Telephone Encounter (Signed)
Returned pt call. Made pt aware that we do not have the varicella vaccine here in the office. Pt states she understands pt states she reached out to [facility] and they made her aware that she has to wait some weeks to get the second dose and they have made her an appt for the second dose

## 2023-03-03 DIAGNOSIS — Z23 Encounter for immunization: Secondary | ICD-10-CM | POA: Diagnosis not present

## 2023-03-04 ENCOUNTER — Ambulatory Visit (INDEPENDENT_AMBULATORY_CARE_PROVIDER_SITE_OTHER): Payer: Medicaid Other | Admitting: Mental Health

## 2023-03-04 DIAGNOSIS — F411 Generalized anxiety disorder: Secondary | ICD-10-CM

## 2023-03-04 DIAGNOSIS — F320 Major depressive disorder, single episode, mild: Secondary | ICD-10-CM

## 2023-03-04 NOTE — Progress Notes (Signed)
THERAPIST PROGRESS NOTE Virtual Visit via Video Note  I connected with Kristin Kemp on 03/04/23 at  8:00 AM EST by a video enabled telemedicine application and verified that I am speaking with the correct person using two identifiers.  Location: Patient: home address on file Provider: home office   I discussed the limitations of evaluation and management by telemedicine and the availability of in person appointments. The patient expressed understanding and agreed to proceed.  I discussed the assessment and treatment plan with the patient. The patient was provided an opportunity to ask questions and all were answered. The patient agreed with the plan and demonstrated an understanding of the instructions.   The patient was advised to call back or seek an in-person evaluation if the symptoms worsen or if the condition fails to improve as anticipated.  I provided 43 minutes of non-face-to-face time during this encounter.   Dorris Singh, [facility]   Session Time: 8: 15am ( 43 minutes)  Participation Level: Active  Behavioral Response: CasualAlertAnxious and Dysphoric  Type of Therapy: Individual Therapy  Treatment Goals addressed: STG: "Better coping skills." Kristin "Tisha" will increase management of anxiety/stressors AEB development of effective coping skills with ability to engage in self-care weekly within the next 90 days    ProgressTowards Goals: Progressing  Interventions: CBT and Supportive  Summary:  Kristin Kemp is a 32 y.o. female who presents with dx of Major depression mild and generalized anxiety. Presents alert and oriented; mood and affect low; tearful at times. Speech clear and coherent at normal rate and tone. Engaged and receptive to interventions. Notes recent increase in stress noting conflict between her sister and husband. Shares events in which sister wanted her husband to buy her out of the house and shares thoughts on wanting husband to be  added to deed of the house. Shares to felt as if she was in the middle. Notes not wanting to take sides. Shares for that to have calmed down some now that sister has been able to secure finances she needed. Shares things to have improved with school and shares to feel more confident. Notes concerns with son and shares for him to have low vision. Shares difficulty with conflict and feels uncomfortable and high anxious in those situations. Able to process with therapist working to reframe manner in which she conflict and thoughts on advocating for self. Explores behavioral changes to support in increase management of stress and notes working to increase self-care. Denies SI/HI/AVH.  Ongoing progress with goals.   Suicidal/Homicidal: Nowithout intent/plan  Therapist Response: Therapist engaged Kristin Kemp in tele-therapy session. Assessed for current location and confidentiality of session.  Provided safe space for Kristin Kemp to share current stressors. Assessed current level of stressors, sxs management and level of stressors. Active empathic listening. Providing supportive feedback. Supported in processing concerns for husband vs. Sister and interactions with them. Explored ability to explore her thoughts on what she feels is right. Explored working through conflict and supported in reframing manner in which she views conflict and discussed conflict and ability to view as ability to advocate for self. Encouraged ability to set boundaries with others of when she needs time and communicating so infirm manner as needed. Provided support in working to advocate for son. Encouraged ongoing engagement in self-care and explored relationship of self-care and high levels of anxiety and ability to tolerate distress. Reviewed session and provided follow up.   Plan: Return again in  x 2 weeks.  Diagnosis: Generalized anxiety  disorder  Mild major depression (HCC)  Collaboration of Care: Other None  Patient/Guardian was  advised Release of Information must be obtained prior to any record release in order to collaborate their care with an outside provider. Patient/Guardian was advised if they have not already done so to contact the registration department to sign all necessary forms in order for [place] to release information regarding their care.   Consent: Patient/Guardian gives verbal consent for treatment and assignment of benefits for services provided during this visit. Patient/Guardian expressed understanding and agreed to proceed.   Stephan Minister [place], [facility] 03/04/2023

## 2023-03-18 ENCOUNTER — Ambulatory Visit (HOSPITAL_COMMUNITY): Payer: Medicaid Other | Admitting: Mental Health

## 2023-03-18 DIAGNOSIS — F411 Generalized anxiety disorder: Secondary | ICD-10-CM | POA: Diagnosis not present

## 2023-03-18 DIAGNOSIS — F320 Major depressive disorder, single episode, mild: Secondary | ICD-10-CM

## 2023-03-18 NOTE — Progress Notes (Unsigned)
THERAPIST PROGRESS NOTE Virtual Visit via Video Note  I connected with Kristin Kemp on 03/18/23 at 11:00 AM EST by a video enabled telemedicine application and verified that I am speaking with the correct person using two identifiers.  Location: Patient: home address on file Provider: home office   I discussed the limitations of evaluation and management by telemedicine and the availability of in person appointments. The patient expressed understanding and agreed to proceed.  I discussed the assessment and treatment plan with the patient. The patient was provided an opportunity to ask questions and all were answered. The patient agreed with the plan and demonstrated an understanding of the instructions.   The patient was advised to call back or seek an in-person evaluation if the symptoms worsen or if the condition fails to improve as anticipated.  I provided 48 minutes of non-face-to-face time during this encounter.   Stephan Minister [place], [facility]   Session Time: 11: 04 am ( 48 minutes)  Participation Level: Active  Behavioral Response: CasualAlertAdequate  Type of Therapy: Individual Therapy  Treatment Goals addressed: STG: "Better coping skills." Kristin Kemp "Tisha" will increase management of anxiety/stressors AEB development of effective coping skills with ability to engage in self-care weekly within the next 90 days    ProgressTowards Goals: Progressing  Interventions: CBT and Supportive  Summary: Kristin Kemp is a 32 y.o. female who presents with dx of Major depression mild and generalized anxiety. Presents alert and oriented; mood and affect adequate; stable. Speech clear and coherent at normal rate and tone. Engaged and receptive to interventions. Notes for feelings of stress and anxiety to have decreased and shares for mood to have been better. Notes to have joined gym and has been attending daily and feels as if this has been helpful for her. Shares feelings of  overwhelm at times and shares can be come overstimulated with children wanting to touch and hug on her and then her husband, attending to home, cooking and cleaning and at times can loose patience and have irritable outburst and then feels bad. Shares to not a physical touch individual at times and shares thoughts on wanting to be able to attend to children and husband. Explored with therapist working to advocate for self and ability to request support from husband at times in which feeling overwhelmed. Open to behavioral changes to support in increase self-care daily and removing self for a break in times when needed. Progress with goals; denies safety concerns.   Suicidal/Homicidal: Nowithout intent/plan  Therapist Response: Therapist engaged [name] in tele-therapy session. Assessed for current location and confidentiality of session.  Provided safe space for Kristin Kemp to share current stressors. Assessed current level of stressors, sxs management and level of stressors. Active empathic listening. Providing supportive feedback. Supported in processing concerns of overwhelmed and explored ability to put in place appropriate boundaries with family as needed to support in mood management. Encouraged development of routine to spend time with children and ability to communicate needs to family members when needed. Discussed ability to listen to body and step away and engage in coping skill before returning to interact with family. Encouraged ongoing attendance to gym and finding time to self daily. Reviewed session and provided follow up.   Plan: Return again in  x8 weeks. Due to availability and holidays  Diagnosis: Generalized anxiety disorder  Mild major depression (HCC)  Collaboration of Care: Other None  Patient/Guardian was advised Release of Information must be obtained prior to any record release in  order to collaborate their care with an outside provider. Patient/Guardian was advised if they have  not already done so to contact the registration department to sign all necessary forms in order for [place] to release information regarding their care.   Consent: Patient/Guardian gives verbal consent for treatment and assignment of benefits for services provided during this visit. Patient/Guardian expressed understanding and agreed to proceed.   Stephan Minister [place], [facility] 03/18/2023

## 2023-04-15 ENCOUNTER — Encounter (INDEPENDENT_AMBULATORY_CARE_PROVIDER_SITE_OTHER): Payer: Self-pay | Admitting: Primary Care

## 2023-04-15 NOTE — Telephone Encounter (Signed)
 Care team updated and letter sent for eye exam notes.

## 2023-05-02 ENCOUNTER — Other Ambulatory Visit: Payer: Self-pay

## 2023-05-02 ENCOUNTER — Encounter (HOSPITAL_COMMUNITY): Payer: Self-pay

## 2023-05-02 ENCOUNTER — Emergency Department (HOSPITAL_COMMUNITY)
Admission: EM | Admit: 2023-05-02 | Discharge: 2023-05-02 | Disposition: A | Discharge status: Home / Self Care | Payer: Medicaid Other | Attending: Student | Admitting: Student

## 2023-05-02 DIAGNOSIS — Z91040 Latex allergy status: Secondary | ICD-10-CM | POA: Insufficient documentation

## 2023-05-02 DIAGNOSIS — K047 Periapical abscess without sinus: Secondary | ICD-10-CM | POA: Insufficient documentation

## 2023-05-02 DIAGNOSIS — Z20822 Contact with and (suspected) exposure to covid-19: Secondary | ICD-10-CM | POA: Insufficient documentation

## 2023-05-02 DIAGNOSIS — R220 Localized swelling, mass and lump, head: Secondary | ICD-10-CM | POA: Diagnosis present

## 2023-05-02 LAB — CBC WITH DIFFERENTIAL/PLATELET
Abs Immature Granulocytes: 0.04 10*3/uL (ref 0.00–0.07)
Basophils Absolute: 0.1 10*3/uL (ref 0.0–0.1)
Basophils Relative: 1 %
Eosinophils Absolute: 0.2 10*3/uL (ref 0.0–0.5)
Eosinophils Relative: 2 %
HCT: 41.5 % (ref 36.0–46.0)
Hemoglobin: 13.9 g/dL (ref 12.0–15.0)
Immature Granulocytes: 0 %
Lymphocytes Relative: 24 %
Lymphs Abs: 2.4 10*3/uL (ref 0.7–4.0)
MCH: 28.1 pg (ref 26.0–34.0)
MCHC: 33.5 g/dL (ref 30.0–36.0)
MCV: 84 fL (ref 80.0–100.0)
Monocytes Absolute: 0.9 10*3/uL (ref 0.1–1.0)
Monocytes Relative: 8 %
Neutro Abs: 6.7 10*3/uL (ref 1.7–7.7)
Neutrophils Relative %: 65 %
Platelets: 328 10*3/uL (ref 150–400)
RBC: 4.94 MIL/uL (ref 3.87–5.11)
RDW: 14 % (ref 11.5–15.5)
WBC: 10.3 10*3/uL (ref 4.0–10.5)
nRBC: 0 % (ref 0.0–0.2)

## 2023-05-02 LAB — BASIC METABOLIC PANEL
Anion gap: 10 (ref 5–15)
BUN: 10 mg/dL (ref 6–20)
CO2: 22 mmol/L (ref 22–32)
Calcium: 9.9 mg/dL (ref 8.9–10.3)
Chloride: 107 mmol/L (ref 98–111)
Creatinine, Ser: 0.89 mg/dL (ref 0.44–1.00)
GFR, Estimated: >60 mL/min (ref >60)
Glucose, Bld: 98 mg/dL (ref 70–99)
Potassium: 4.2 mmol/L (ref 3.5–5.1)
Sodium: 139 mmol/L (ref 135–145)

## 2023-05-02 LAB — RESP PANEL BY RT-PCR (RSV, FLU A&B, COVID)  RVPGX2
Influenza A by PCR: NEGATIVE
Influenza B by PCR: NEGATIVE
Resp Syncytial Virus by PCR: NEGATIVE
SARS Coronavirus 2 by RT PCR: NEGATIVE

## 2023-05-02 LAB — GROUP A STREP BY PCR: Group A Strep by PCR: NOT DETECTED

## 2023-05-02 MED ORDER — AMOXICILLIN 500 MG PO CAPS: 500.0000 mg | ORAL_CAPSULE | Freq: Three times a day (TID) | ORAL | 0 refills | Status: DC

## 2023-05-02 MED ORDER — NAPROXEN 500 MG PO TABS: 500.0000 mg | ORAL_TABLET | Freq: Two times a day (BID) | ORAL | 0 refills | Status: DC

## 2023-05-02 NOTE — ED Triage Notes (Signed)
 Pt arrived from home via POV c/o swollen sore throat. Pt states that it woke her up out of her sleep. Pt states that it is hard for her to swallow. Pt is worried that it maybe an allergic reaction

## 2023-05-02 NOTE — ED Provider Triage Note (Signed)
 Emergency Medicine Provider Triage Evaluation Note  Kristin Kemp , a 33 y.o. female  was evaluated in triage.  Pt complains of sore throat on R side, dental pain, pain with swallowing.  Review of Systems  Positive: As above Negative: Rash, SOB, difficulty swallowing  Physical Exam  BP 121/77 (BP Location: Right Arm)   Pulse 94   Temp 98 F (36.7 C)   Resp 18   Ht 5' 3 (1.6 m)   Wt 73 kg   SpO2 99%   BMI 28.52 kg/m  Gen:   Awake, no distress   Resp:  Normal effort  MSK:   Moves extremities without difficulty  Othr:  Gingival and buccal mucosal edema on the right posterior mandibular. No sublingual or submental TTP. Trachea midline  Medical Decision Making  Medically screening exam initiated at 5:58 AM.  Appropriate orders placed.  Kristin Kemp was informed that the remainder of the evaluation will be completed by another provider, this initial triage assessment does not replace that evaluation, and the importance of remaining in the ED until their evaluation is complete.  No clinical concern for anaphylaxis.   This chart was dictated using voice recognition software, Dragon. Despite the best efforts of this provider to proofread and correct errors, errors may still occur which can change documentation meaning.    Kristin Pleasant SAUNDERS, PA-C 05/02/23 0559

## 2023-05-02 NOTE — ED Provider Notes (Signed)
 [place] EMERGENCY DEPARTMENT AT [facility] Provider Note   CSN: [id number] Arrival date & time: 05/02/23  0453     History  Chief Complaint  Patient presents with   Sore Throat   Oral Swelling    Kristin Kemp is a 33 y.o. female.   Sore Throat   This patient is a 33 year old female presenting with swelling on the right side of her face with some gingival swelling and tenderness, has no fevers no chills and has had no significant sore throat, she woke up this morning with this and was concerned because she might of had a lymph node swollen in her neck.  No recent trauma no injuries no fevers no sick exposures.    Home Medications Prior to Admission medications   Medication Sig Start Date End Date Taking? Authorizing Provider  amoxicillin  (AMOXIL ) 500 MG capsule Take 1 capsule (500 mg total) by mouth 3 (three) times daily. 05/02/23  Yes Cleotilde Rogue, MD  naproxen  (NAPROSYN ) 500 MG tablet Take 1 tablet (500 mg total) by mouth 2 (two) times daily with a meal. 05/02/23  Yes Cleotilde Rogue, MD  Multiple Vitamin (MULTIVITAMIN) LIQD Take 5 mLs by mouth daily.    [provider]  ondansetron  (ZOFRAN -ODT) 4 MG disintegrating tablet Take 1 tablet (4 mg total) by mouth every 8 (eight) hours as needed for nausea or vomiting. 12/23/22   Desiderio Chew, PA-C      Allergies    Chocolate, Cinnamon, and Latex    Review of Systems   Review of Systems  All other systems reviewed and are negative.   Physical Exam Updated Vital Signs BP 121/77 (BP Location: Right Arm)   Pulse 94   Temp 98 F (36.7 C)   Resp 18   Ht 1.6 m (5' 3)   Wt 73 kg   SpO2 99%   BMI 28.52 kg/m  Physical Exam Vitals and nursing note reviewed.  Constitutional:      Appearance: She is well-developed. She is not diaphoretic.  HENT:     Head: Normocephalic and atraumatic.     Mouth/Throat:     Comments: No trismus or torticollis, no lymphadenopathy of the neck, there is mild tenderness  along the gingiva of the right upper and lower gums, dentition appears intact, no abscesses, no deep caries, normal phonation, normal posterior pharynx Eyes:     General:        Right eye: No discharge.        Left eye: No discharge.     Conjunctiva/sclera: Conjunctivae normal.  Cardiovascular:     Rate and Rhythm: Normal rate and regular rhythm.  Pulmonary:     Effort: Pulmonary effort is normal. No respiratory distress.  Skin:    General: Skin is warm and dry.     Findings: No erythema or rash.  Neurological:     Mental Status: She is alert.     Coordination: Coordination normal.     ED Results / Procedures / Treatments   Labs (all labs ordered are listed, but only abnormal results are displayed) Labs Reviewed  GROUP A STREP BY PCR  RESP PANEL BY RT-PCR (RSV, FLU A&B, COVID)  RVPGX2  CBC WITH DIFFERENTIAL/PLATELET  BASIC METABOLIC PANEL    EKG None  Radiology No results found.  Procedures Procedures    Medications Ordered in ED Medications - No data to display  ED Course/ Medical Decision Making/ A&P  Medical Decision Making Risk Prescription drug management.   Well-appearing, dentition intact, gingivitis versus early dental infection, stable for discharge, amoxicillin  and Naprosyn , patient agreeable        Final Clinical Impression(s) / ED Diagnoses Final diagnoses:  Dental infection    Rx / DC Orders ED Discharge Orders          Ordered    amoxicillin  (AMOXIL ) 500 MG capsule  3 times daily        05/02/23 1010    naproxen  (NAPROSYN ) 500 MG tablet  2 times daily with meals        05/02/23 1010              Cleotilde Rogue, MD 05/02/23 1011

## 2023-05-02 NOTE — Discharge Instructions (Signed)
 Please take Amoxicillin  exactly as prescribed, this is used to treat bacterial infections, it treats a variety of infections including strep throat, ear infections, and a few other bacterial infections.   Do not take this medication if you are allergic to amoxicillin  or penicillin .  effects of medications such as antibiotics include diarrhea which may occur as well as potentially inactivating birth control so if you are using a birth control pill please use an alternative form of birth control for the next 2 weeks.  There is occasions where this antibiotic does not work so if you are not improving within 48 hours you will need to be reevaluated immediately by your doctor or in the emergency department if your symptoms are worsening  Please take Naprosyn , 500mg  by mouth twice daily as needed for pain - this in an antiinflammatory medicine (NSAID) and is similar to ibuprofen  - many people feel that it is stronger than ibuprofen  and it is easier to take since it is a smaller pill.  Please use this only for 1 week - if your pain persists, you will need to follow up with your doctor in the office for ongoing guidance and pain control.  Thank you for allowing us  to treat you in the emergency department today.  After reviewing your examination and potential testing that was done it appears that you are safe to go home.  I would like for you to follow-up with your doctor within the next several days, have them obtain your records and follow-up with them to review all potential tests and results from your visit.  If you should develop severe or worsening symptoms return to the emergency department immediately

## 2023-06-29 DIAGNOSIS — Z1389 Encounter for screening for other disorder: Secondary | ICD-10-CM | POA: Diagnosis not present

## 2023-06-29 DIAGNOSIS — Z130 Encounter for screening for diseases of the blood and blood-forming organs and certain disorders involving the immune mechanism: Secondary | ICD-10-CM | POA: Diagnosis not present

## 2023-06-29 DIAGNOSIS — Z30011 Encounter for initial prescription of contraceptive pills: Secondary | ICD-10-CM | POA: Diagnosis not present

## 2023-06-29 DIAGNOSIS — Z01411 Encounter for gynecological examination (general) (routine) with abnormal findings: Secondary | ICD-10-CM | POA: Diagnosis not present

## 2023-06-29 DIAGNOSIS — Z30013 Encounter for initial prescription of injectable contraceptive: Secondary | ICD-10-CM | POA: Diagnosis not present

## 2023-06-29 DIAGNOSIS — R6882 Decreased libido: Secondary | ICD-10-CM | POA: Diagnosis not present

## 2023-09-07 ENCOUNTER — Ambulatory Visit (HOSPITAL_COMMUNITY): Admitting: Mental Health

## 2023-09-07 ENCOUNTER — Encounter (HOSPITAL_COMMUNITY): Payer: Self-pay

## 2023-09-13 ENCOUNTER — Ambulatory Visit (INDEPENDENT_AMBULATORY_CARE_PROVIDER_SITE_OTHER): Admitting: Primary Care

## 2023-10-10 ENCOUNTER — Ambulatory Visit: Payer: Self-pay

## 2023-10-10 NOTE — Telephone Encounter (Signed)
 FYI Only or Action Required?: FYI only for provider  Patient was last seen in primary care on 12/08/2022 by Kristin Siemens, NP. Called Nurse Triage reporting Poison Ivy. Symptoms began several weeks ago. Interventions attempted: OTC medications: cortisone cream, [facility] brand  poison ivy body wash and Other: diluted Clorox bleach wash. Symptoms are: bilateral arms with severe itching and rash unchanged.  Triage Disposition: See Physician Within 24 Hours  Patient/caregiver understands and will follow disposition?: Unsure                     Copied from CRM [id number]. Topic: Clinical - Red Word Triage >> Oct 10, 2023 10:29 AM Kristin Kemp T wrote: Red Word that prompted transfer to Nurse Triage: patient said she ran into some poison ivy and she has an outbreak on both her arms   ----------------------------------------------------------------------- From previous Reason for Contact - Medical Advice: Reason for CRM: Reason for Disposition  MODERATE to SEVERE itching (e.g., interferes with work, school, sleep, or other activities)  Answer Assessment - Initial Assessment Questions 1. APPEARANCE of RASH: Describe the rash.      Scaly looking, will flare up and cause raised whelps. Really bumpy, small bumps.  2. LOCATION: Where is the rash located?  (e.g., face, genitals, hands, legs)     Both arms.  3. SIZE: How large is the rash?      She states it covers from wrist to the bend of her elbow on both arms.  4. ONSET: When did the rash begin?      She states it has been about 3-4 weeks.  5. ITCHING: Does the rash itch? If Yes, ask: How bad is it?   - MILD - doesn't interfere with normal activities   - MODERATE-SEVERE: interferes with work, school, sleep, or other activities      Severe. She states she has been scratching and caused bleeding.  6. EXPOSURE:  How were you exposed to the plant (poison ivy, poison oak, sumac)  When were you exposed?        About 4 weeks ago exposed to poison ivy.  7. PAST HISTORY: Have you had a poison ivy rash before? If Yes, ask: How bad was it?     No.  8. PREGNANCY: Is there any chance you are pregnant? When was your last menstrual period?     LMP: 09/30/23.  Protocols used: Poison Ivy - Oak - Sumac-A-AH

## 2023-10-20 ENCOUNTER — Ambulatory Visit (INDEPENDENT_AMBULATORY_CARE_PROVIDER_SITE_OTHER): Admitting: Primary Care

## 2023-10-31 ENCOUNTER — Encounter (HOSPITAL_COMMUNITY): Payer: Self-pay

## 2023-10-31 ENCOUNTER — Ambulatory Visit (HOSPITAL_COMMUNITY): Admitting: Mental Health

## 2023-10-31 ENCOUNTER — Telehealth (HOSPITAL_COMMUNITY): Payer: Self-pay | Admitting: Mental Health

## 2023-10-31 NOTE — Progress Notes (Deleted)
T

## 2023-10-31 NOTE — Telephone Encounter (Signed)
 Therapist sent link for tele-therapy session. No response after x 10 minutes. Contacted pt with number on file; no answer. Left HIPAA complaint voicemail. NS

## 2023-11-17 ENCOUNTER — Encounter (INDEPENDENT_AMBULATORY_CARE_PROVIDER_SITE_OTHER): Payer: Self-pay | Admitting: Primary Care

## 2023-11-21 ENCOUNTER — Telehealth (INDEPENDENT_AMBULATORY_CARE_PROVIDER_SITE_OTHER): Payer: Self-pay | Admitting: Primary Care

## 2023-11-21 NOTE — Telephone Encounter (Signed)
 Called pt to confirm appt. Pt did not answer and LVM

## 2023-11-24 ENCOUNTER — Encounter (HOSPITAL_BASED_OUTPATIENT_CLINIC_OR_DEPARTMENT_OTHER): Payer: Self-pay | Admitting: Emergency Medicine

## 2023-11-24 ENCOUNTER — Emergency Department (HOSPITAL_BASED_OUTPATIENT_CLINIC_OR_DEPARTMENT_OTHER)
Admission: EM | Admit: 2023-11-24 | Discharge: 2023-11-24 | Disposition: A | Discharge status: Home / Self Care | Attending: Emergency Medicine | Admitting: Emergency Medicine

## 2023-11-24 ENCOUNTER — Emergency Department (HOSPITAL_BASED_OUTPATIENT_CLINIC_OR_DEPARTMENT_OTHER)

## 2023-11-24 ENCOUNTER — Other Ambulatory Visit: Payer: Self-pay

## 2023-11-24 DIAGNOSIS — O3680X0 Pregnancy with inconclusive fetal viability, not applicable or unspecified: Secondary | ICD-10-CM | POA: Diagnosis not present

## 2023-11-24 DIAGNOSIS — K5900 Constipation, unspecified: Secondary | ICD-10-CM | POA: Diagnosis not present

## 2023-11-24 DIAGNOSIS — Z3A00 Weeks of gestation of pregnancy not specified: Secondary | ICD-10-CM | POA: Diagnosis not present

## 2023-11-24 DIAGNOSIS — R1030 Lower abdominal pain, unspecified: Secondary | ICD-10-CM | POA: Insufficient documentation

## 2023-11-24 DIAGNOSIS — Z3A01 Less than 8 weeks gestation of pregnancy: Secondary | ICD-10-CM | POA: Diagnosis not present

## 2023-11-24 DIAGNOSIS — Z91040 Latex allergy status: Secondary | ICD-10-CM | POA: Insufficient documentation

## 2023-11-24 DIAGNOSIS — O209 Hemorrhage in early pregnancy, unspecified: Secondary | ICD-10-CM | POA: Diagnosis not present

## 2023-11-24 DIAGNOSIS — O26899 Other specified pregnancy related conditions, unspecified trimester: Secondary | ICD-10-CM | POA: Insufficient documentation

## 2023-11-24 DIAGNOSIS — O26891 Other specified pregnancy related conditions, first trimester: Secondary | ICD-10-CM | POA: Diagnosis not present

## 2023-11-24 DIAGNOSIS — O34219 Maternal care for unspecified type scar from previous cesarean delivery: Secondary | ICD-10-CM | POA: Diagnosis not present

## 2023-11-24 LAB — HCG, QUANTITATIVE, PREGNANCY: hCG, Beta Chain, Quant, S: 1322 m[IU]/mL — ABNORMAL HIGH (ref <5)

## 2023-11-24 LAB — URINALYSIS, ROUTINE W REFLEX MICROSCOPIC
Bilirubin Urine: NEGATIVE
Glucose, UA: NEGATIVE mg/dL
Hgb urine dipstick: NEGATIVE
Ketones, ur: NEGATIVE mg/dL
Leukocytes,Ua: NEGATIVE
Nitrite: NEGATIVE
Protein, ur: NEGATIVE mg/dL
Specific Gravity, Urine: 1.015 (ref 1.005–1.030)
pH: 5.5 (ref 5.0–8.0)

## 2023-11-24 LAB — CBC
HCT: 39.4 % (ref 36.0–46.0)
Hemoglobin: 13.4 g/dL (ref 12.0–15.0)
MCH: 28.2 pg (ref 26.0–34.0)
MCHC: 34 g/dL (ref 30.0–36.0)
MCV: 82.9 fL (ref 80.0–100.0)
Platelets: 317 K/uL (ref 150–400)
RBC: 4.75 MIL/uL (ref 3.87–5.11)
RDW: 14.8 % (ref 11.5–15.5)
WBC: 8.4 K/uL (ref 4.0–10.5)
nRBC: 0 % (ref 0.0–0.2)

## 2023-11-24 LAB — COMPREHENSIVE METABOLIC PANEL WITH GFR
ALT: 42 U/L (ref 0–44)
AST: 41 U/L (ref 15–41)
Albumin: 4.1 g/dL (ref 3.5–5.0)
Alkaline Phosphatase: 57 U/L (ref 38–126)
Anion gap: 11 (ref 5–15)
BUN: 7 mg/dL (ref 6–20)
CO2: 22 mmol/L (ref 22–32)
Calcium: 9.5 mg/dL (ref 8.9–10.3)
Chloride: 107 mmol/L (ref 98–111)
Creatinine, Ser: 0.75 mg/dL (ref 0.44–1.00)
GFR, Estimated: >60 mL/min (ref >60)
Glucose, Bld: 117 mg/dL — ABNORMAL HIGH (ref 70–99)
Potassium: 4.2 mmol/L (ref 3.5–5.1)
Sodium: 140 mmol/L (ref 135–145)
Total Bilirubin: 0.2 mg/dL (ref 0.0–1.2)
Total Protein: 6.5 g/dL (ref 6.5–8.1)

## 2023-11-24 LAB — PREGNANCY, URINE: Preg Test, Ur: POSITIVE — AB

## 2023-11-24 LAB — LIPASE, BLOOD: Lipase: 32 U/L (ref 11–51)

## 2023-11-24 NOTE — Discharge Instructions (Signed)
 Please rest and stay well-hydrated.  Tylenol  for pain.  Follow-up with MAU at [facility] on Sunday.  Return if increased pain fever or other concerns.

## 2023-11-24 NOTE — ED Notes (Signed)
 Pt denies abd pain at moment. Denies vaginal discharge or odor. States she has been spotting since July 3rd

## 2023-11-24 NOTE — ED Triage Notes (Signed)
 Pt POV c/o RLQ abd pain, intermittent. Denies n/v/d/fever.  Decreased appetite. LBM today. Took laxative yesterday.

## 2023-11-24 NOTE — ED Provider Notes (Signed)
 [place] EMERGENCY DEPARTMENT AT [facility] Provider Note   CSN: [id number] Arrival date & time: 11/24/23  1311     Patient presents with: Abdominal Pain   Kristin Kemp is a 33 y.o. female.  She is here with a complaint of some lower abdominal pain that is been going on for a few days associated with some spotting vaginally.  She said she had her regular period at the beginning of the month.  Has been spotting for a few days.  Had this intermittent stabbing lower abdominal pain radiating to her rectum.  Thought she was constipated so tried some medication with a bowel movement.  Did not seem to help the pain though.  No fevers chills nausea vomiting.  No vaginal discharge.   {Add pertinent medical, surgical, social history, OB history to YEP:67052} The history is provided by the patient.  Abdominal Pain Pain location: lower. Pain quality: pressure and stabbing   Pain radiates to:  Anus Pain severity:  Moderate Onset quality:  Gradual Duration:  3 days Timing:  Intermittent Progression:  Unchanged Chronicity:  New Worsened by:  Nothing Ineffective treatments:  Bowel activity Associated symptoms: constipation and vaginal bleeding (spotting)   Associated symptoms: no chest pain, no cough, no diarrhea, no dysuria, no fever, no hematemesis, no nausea, no shortness of breath, no vaginal discharge and no vomiting        Prior to Admission medications   Medication Sig Start Date End Date Taking? Authorizing Provider  amoxicillin  (AMOXIL ) 500 MG capsule Take 1 capsule (500 mg total) by mouth 3 (three) times daily. 05/02/23   Cleotilde Rogue, MD  Multiple Vitamin (MULTIVITAMIN) LIQD Take 5 mLs by mouth daily.    [provider]  naproxen  (NAPROSYN ) 500 MG tablet Take 1 tablet (500 mg total) by mouth 2 (two) times daily with a meal. 05/02/23   Cleotilde Rogue, MD  ondansetron  (ZOFRAN -ODT) 4 MG disintegrating tablet Take 1 tablet (4 mg total) by mouth every 8 (eight)  hours as needed for nausea or vomiting. 12/23/22   Desiderio Chew, PA-C    Allergies: Chocolate, Cinnamon, and Latex    Review of Systems  Constitutional:  Negative for fever.  Respiratory:  Negative for cough and shortness of breath.   Cardiovascular:  Negative for chest pain.  Gastrointestinal:  Positive for abdominal pain and constipation. Negative for diarrhea, hematemesis, nausea and vomiting.  Genitourinary:  Positive for vaginal bleeding (spotting). Negative for dysuria and vaginal discharge.    Updated Vital Signs BP (!) 145/91   Pulse 93   Temp 98.4 F (36.9 C)   Resp 15   Ht 5' 3 (1.6 m)   LMP 10/25/2023 (Approximate)   SpO2 100%   Breastfeeding No   BMI 28.52 kg/m   Physical Exam Vitals and nursing note reviewed.  Constitutional:      General: She is not in acute distress.    Appearance: Normal appearance. She is well-developed.  HENT:     Head: Normocephalic and atraumatic.  Eyes:     Conjunctiva/sclera: Conjunctivae normal.  Cardiovascular:     Rate and Rhythm: Normal rate and regular rhythm.     Heart sounds: No murmur heard. Pulmonary:     Effort: Pulmonary effort is normal. No respiratory distress.     Breath sounds: Normal breath sounds. No stridor. No wheezing.  Abdominal:     Palpations: Abdomen is soft.     Tenderness: There is no abdominal tenderness. There is no guarding or rebound.  Musculoskeletal:        General: No tenderness or deformity. Normal range of motion.     Cervical back: Neck supple.  Skin:    General: Skin is warm and dry.     Capillary Refill: Capillary refill takes less than 2 seconds.  Neurological:     General: No focal deficit present.     Mental Status: She is alert.     GCS: GCS eye subscore is 4. GCS verbal subscore is 5. GCS motor subscore is 6.     (all labs ordered are listed, but only abnormal results are displayed) Labs Reviewed  COMPREHENSIVE METABOLIC PANEL WITH GFR - Abnormal; Notable for the following  components:      Result Value   Glucose, Bld 117 (*)    All other components within normal limits  PREGNANCY, URINE - Abnormal; Notable for the following components:   Preg Test, Ur POSITIVE (*)    All other components within normal limits  LIPASE, BLOOD  CBC  URINALYSIS, ROUTINE W REFLEX MICROSCOPIC  HCG, QUANTITATIVE, PREGNANCY    EKG: None  Radiology: No results found.  {Document cardiac monitor, telemetry assessment procedure when appropriate:32947} Procedures   Medications Ordered in the ED - No data to display    {Click here for ABCD2, HEART and other calculators REFRESH Note before signing:1}                              Medical Decision Making Amount and/or Complexity of Data Reviewed Labs: ordered. Radiology: ordered.   This patient complains of ***; this involves an extensive number of treatment Options and is a complaint that carries with it a high risk of complications and morbidity. The differential includes ***  I ordered, reviewed and interpreted labs, which included *** I ordered medication *** and reviewed PMP when indicated. I ordered imaging studies which included *** and I independently    visualized and interpreted imaging which showed *** Additional history obtained from *** Previous records obtained and reviewed *** I consulted *** and discussed lab and imaging findings and discussed disposition.  Cardiac monitoring reviewed, *** Social determinants considered, *** Critical Interventions: ***  After the interventions stated above, I reevaluated the patient and found *** Admission and further testing considered, ***   {Document critical care time when appropriate  Document review of labs and clinical decision tools ie CHADS2VASC2, etc  Document your independent review of radiology images and any outside records  Document your discussion with family members, caretakers and with consultants  Document social determinants of health affecting  pt's care  Document your decision making why or why not admission, treatments were needed:32947:::1}   Final diagnoses:  None    ED Discharge Orders     None

## 2023-11-25 ENCOUNTER — Inpatient Hospital Stay (HOSPITAL_COMMUNITY)
Admission: AD | Admit: 2023-11-25 | Discharge: 2023-11-25 | Disposition: A | Discharge status: Home / Self Care | Attending: Obstetrics and Gynecology | Admitting: Obstetrics and Gynecology

## 2023-11-25 ENCOUNTER — Encounter (HOSPITAL_COMMUNITY): Payer: Self-pay | Admitting: Obstetrics and Gynecology

## 2023-11-25 ENCOUNTER — Other Ambulatory Visit: Payer: Self-pay

## 2023-11-25 DIAGNOSIS — N939 Abnormal uterine and vaginal bleeding, unspecified: Secondary | ICD-10-CM

## 2023-11-25 DIAGNOSIS — R1031 Right lower quadrant pain: Secondary | ICD-10-CM | POA: Diagnosis present

## 2023-11-25 DIAGNOSIS — O3680X0 Pregnancy with inconclusive fetal viability, not applicable or unspecified: Secondary | ICD-10-CM | POA: Diagnosis not present

## 2023-11-25 DIAGNOSIS — O26851 Spotting complicating pregnancy, first trimester: Secondary | ICD-10-CM | POA: Insufficient documentation

## 2023-11-25 DIAGNOSIS — Z3A00 Weeks of gestation of pregnancy not specified: Secondary | ICD-10-CM | POA: Diagnosis not present

## 2023-11-25 DIAGNOSIS — Z3A01 Less than 8 weeks gestation of pregnancy: Secondary | ICD-10-CM | POA: Insufficient documentation

## 2023-11-25 LAB — WET PREP, GENITAL
Sperm: NONE SEEN
Trich, Wet Prep: NONE SEEN
WBC, Wet Prep HPF POC: <10 (ref <10)
Yeast Wet Prep HPF POC: NONE SEEN

## 2023-11-25 NOTE — MAU Note (Signed)
 Kristin Kemp is a 33 y.o. at Unknown here in MAU reporting: she's having RLQ pain since Monday.  States the pain is an  intermittent sharp pain.  Reports has been spotting since last month, had +UPT  & HCG yesterday.    Seen [facility] [place] ED yesterday  LMP: 10/27/2023 Onset of complaint: Monday Pain score: 5 There were no vitals filed for this visit.   FHT: NA  Lab orders placed from triage: None

## 2023-11-25 NOTE — MAU Provider Note (Signed)
 Event Date/Time   First Provider Initiated Contact with Patient 11/25/23 1456      S Ms. Kristin Kemp is a 33 y.o. [phone] patient who presents to MAU today with complaint of right lower quadrant pain since Monday 11/21/23  and vaginal spotting.  Patient was seen at [facility] ED yesterday, 11/24/23.  Patient states she called her OB/GYN this morning and since they were unable to see her in the office they advised her to come here to the MAU for follow-up.  Patient states she was unaware that the plan was for her to return to the MAU on Sunday, 11/27/2023 for a repeat hCG level.  Patient denies any significant difference in right lower quadrant pain and denies any increase in vaginal bleeding at this time.     Copied from ED Note 11/24/23     Clinical Course as of 11/25/23 0914  Thu Nov 24, 2023  1627 Patient's blood type is A+. [MB]  1627 She is a G4, P2 with 1 prior miscarriage.  She does have a GYN to follow-up with [MB]  1720 Received a call from radiology.  They do not see an IUP on her ultrasound but she does have something adjacent to her right ovary and they cannot exclude that this is an ectopic. [MB]  1732 Reviewed ultrasound with gynecology on-call Dr. Jayne.  He reviewed the images and does not see anything definitive for an ectopic at this point.  He thought with her stable vitals and exam she was appropriate to follow-up with the MAU on Sunday for repeat blood work and evaluation.  Discharge with precautions. [MB]  1733 I reviewed results with patient and she is comfortable plan for outpatient follow-up.  She currently has stable vitals and benign exam so I think this is reasonable.  Gave her strict return precautions [MB]        O BP 133/85 (BP Location: Right Arm)   Pulse 81   Temp 98.5 F (36.9 C) (Oral)   Ht 5' 3 (1.6 m)   Wt 74.3 kg   LMP 10/27/2023 (Approximate)   SpO2 100%   BMI 29.03 kg/m  Physical Exam Vitals and nursing note reviewed.  Constitutional:       General: She is not in acute distress.    Appearance: Normal appearance. She is well-developed. She is not ill-appearing.  HENT:     Head: Normocephalic.     Nose: Nose normal.     Mouth/Throat:     Mouth: Mucous membranes are moist.  Cardiovascular:     Rate and Rhythm: Normal rate.  Pulmonary:     Effort: Pulmonary effort is normal.  Abdominal:     Palpations: Abdomen is soft.     Tenderness: There is no abdominal tenderness.  Musculoskeletal:        General: Normal range of motion.     Cervical back: Normal range of motion.  Skin:    General: Skin is warm.  Neurological:     Mental Status: She is alert and oriented to person, place, and time.  Psychiatric:        Mood and Affect: Mood normal.        Behavior: Behavior normal.     Copied from 11/24/23 Imaging  Narrative & Impression  CLINICAL DATA:  Right lower quadrant pain.  Beta hCG = 1,322   EXAM: OBSTETRIC <14 WK US  AND TRANSVAGINAL OB US    TECHNIQUE: Both transabdominal and transvaginal ultrasound examinations were performed for complete  evaluation of the gestation as well as the maternal uterus, adnexal regions, and pelvic cul-de-sac. Transvaginal technique was performed to assess early pregnancy.   COMPARISON:  None Available.   FINDINGS: Intrauterine gestational sac: None   Yolk sac:  Not Visualized.   Embryo:  Not Visualized.   Subchorionic hemorrhage:  None visualized.   Maternal uterus/adnexae: Endometrium measures 6 mm. Postsurgical changes of prior cesarean section. Small volume pelvic free fluid. Right ovary measures 4.1 x 2.4 x 2.0 cm. Within the right adnexa, there is a heterogeneously echogenic structure measuring 3.6 x 2.7 x 2.6 cm, inseparable from the medial right ovary. There is some internal vascularity by color Doppler evaluation. Left ovary measures 3.3 x 2.5 x 1.3 cm. No left adnexal mass.   IMPRESSION: Pregnancy of unknown location. Heterogeneously echogenic  structure inseparable from the right ovary may represent a possible ectopic pregnancy. Additional differentials may include early intrauterine pregnancy or completed spontaneous abortion.   These results were discussed by telephone on 11/24/2023 at 5:21 pm with provider MICHAEL BUTLER .     Electronically Signed   By: Limin  Xu M.D.   On: 11/24/2023 17:21      MDM  LOW  Chart reviewed including ED notes from 11/24/2023 VSS Physical exam performed Patient stable for discharge at this time  and will follow up on Sunday 11/27/23 as scheduled for stat hCG Ectopic precautions reviewed   Orders Placed This Encounter  Procedures   Wet prep, genital    Standing Status:   Standing    Number of Occurrences:   1      Results for orders placed or performed during the hospital encounter of 11/24/23 (from the past 24 hours)  hCG, quantitative, pregnancy     Status: Abnormal   Collection Time: 11/24/23  3:38 PM  Result Value Ref Range   hCG, Beta Chain, Quant, S 1,322 (H) <5 mIU/mL       A/P as described above .  Counseling and education provided and patient agreeable  with plan as described below. Verbalized understanding.    ASSESSMENT Medical screening exam complete  1. Vaginal spotting (Primary)  2. Pregnancy of unknown anatomic location     PLAN  F/U Sunday (11/27/23) in MAU for STAT HCG   Discharge from MAU in stable condition  See AVS for full description of educational information and instructions provided to the patient at time of discharge  Warning signs for worsening condition that would warrant emergency follow-up discussed Patient may return to MAU as needed   Littie Olam LABOR, NP 11/25/2023 2:56 PM

## 2023-11-27 ENCOUNTER — Inpatient Hospital Stay (HOSPITAL_COMMUNITY)

## 2023-11-27 ENCOUNTER — Encounter (HOSPITAL_COMMUNITY)
Admission: AD | Disposition: A | Discharge status: Home / Self Care | Payer: Self-pay | Source: Home / Self Care | Attending: Obstetrics and Gynecology

## 2023-11-27 ENCOUNTER — Encounter (HOSPITAL_COMMUNITY): Payer: Self-pay | Admitting: Obstetrics and Gynecology

## 2023-11-27 ENCOUNTER — Inpatient Hospital Stay (HOSPITAL_COMMUNITY): Admitting: Anesthesiology

## 2023-11-27 ENCOUNTER — Inpatient Hospital Stay (HOSPITAL_COMMUNITY)
Admission: AD | Admit: 2023-11-27 | Discharge: 2023-11-27 | Disposition: A | Discharge status: Home / Self Care | Attending: Obstetrics and Gynecology | Admitting: Obstetrics and Gynecology

## 2023-11-27 ENCOUNTER — Inpatient Hospital Stay (EMERGENCY_DEPARTMENT_HOSPITAL): Admitting: Anesthesiology

## 2023-11-27 DIAGNOSIS — O99330 Smoking (tobacco) complicating pregnancy, unspecified trimester: Secondary | ICD-10-CM | POA: Diagnosis not present

## 2023-11-27 DIAGNOSIS — O00101 Right tubal pregnancy without intrauterine pregnancy: Secondary | ICD-10-CM | POA: Insufficient documentation

## 2023-11-27 DIAGNOSIS — F17210 Nicotine dependence, cigarettes, uncomplicated: Secondary | ICD-10-CM | POA: Insufficient documentation

## 2023-11-27 DIAGNOSIS — O26891 Other specified pregnancy related conditions, first trimester: Secondary | ICD-10-CM | POA: Diagnosis not present

## 2023-11-27 DIAGNOSIS — Z9889 Other specified postprocedural states: Secondary | ICD-10-CM

## 2023-11-27 DIAGNOSIS — Z3A01 Less than 8 weeks gestation of pregnancy: Secondary | ICD-10-CM

## 2023-11-27 DIAGNOSIS — R109 Unspecified abdominal pain: Secondary | ICD-10-CM | POA: Diagnosis not present

## 2023-11-27 DIAGNOSIS — Z3A11 11 weeks gestation of pregnancy: Secondary | ICD-10-CM | POA: Diagnosis not present

## 2023-11-27 HISTORY — DX: Depression, unspecified: F32.A

## 2023-11-27 LAB — COMPREHENSIVE METABOLIC PANEL WITH GFR
ALT: 33 U/L (ref 0–44)
AST: 28 U/L (ref 15–41)
Albumin: 3.3 g/dL — ABNORMAL LOW (ref 3.5–5.0)
Alkaline Phosphatase: 48 U/L (ref 38–126)
Anion gap: 7 (ref 5–15)
BUN: 9 mg/dL (ref 6–20)
CO2: 20 mmol/L — ABNORMAL LOW (ref 22–32)
Calcium: 8.8 mg/dL — ABNORMAL LOW (ref 8.9–10.3)
Chloride: 110 mmol/L (ref 98–111)
Creatinine, Ser: 0.68 mg/dL (ref 0.44–1.00)
GFR, Estimated: >60 mL/min (ref >60)
Glucose, Bld: 109 mg/dL — ABNORMAL HIGH (ref 70–99)
Potassium: 4 mmol/L (ref 3.5–5.1)
Sodium: 137 mmol/L (ref 135–145)
Total Bilirubin: 0.6 mg/dL (ref 0.0–1.2)
Total Protein: 5.9 g/dL — ABNORMAL LOW (ref 6.5–8.1)

## 2023-11-27 LAB — CBC
HCT: 36.7 % (ref 36.0–46.0)
Hemoglobin: 12.8 g/dL (ref 12.0–15.0)
MCH: 28.6 pg (ref 26.0–34.0)
MCHC: 34.9 g/dL (ref 30.0–36.0)
MCV: 82.1 fL (ref 80.0–100.0)
Platelets: 306 K/uL (ref 150–400)
RBC: 4.47 MIL/uL (ref 3.87–5.11)
RDW: 14.6 % (ref 11.5–15.5)
WBC: 8 K/uL (ref 4.0–10.5)
nRBC: 0 % (ref 0.0–0.2)

## 2023-11-27 LAB — HCG, QUANTITATIVE, PREGNANCY: hCG, Beta Chain, Quant, S: 1579 m[IU]/mL — ABNORMAL HIGH (ref <5)

## 2023-11-27 LAB — TYPE AND SCREEN
ABO/RH(D): A POS
Antibody Screen: NEGATIVE

## 2023-11-27 SURGERY — LAPAROSCOPY, WITH ECTOPIC PREGNANCY SURGICAL TREATMENT
Anesthesia: General

## 2023-11-27 MED ORDER — OXYCODONE HCL 5 MG PO TABS
5.0000 mg | ORAL_TABLET | Freq: Once | ORAL | Status: AC | PRN
  Administered 2023-11-27: 5 mg via ORAL

## 2023-11-27 MED ORDER — OXYCODONE-ACETAMINOPHEN 5-325 MG PO TABS: 1.0000 | ORAL_TABLET | ORAL | 0 refills | Status: AC | PRN

## 2023-11-27 MED ORDER — ONDANSETRON HCL 4 MG/2ML IJ SOLN
INTRAMUSCULAR | Status: DC | PRN
  Administered 2023-11-27: 4 mg via INTRAVENOUS

## 2023-11-27 MED ORDER — OXYCODONE HCL 5 MG PO TABS
ORAL_TABLET | ORAL | Status: AC
Start: 2023-11-27 — End: 2023-11-27
  Filled 2023-11-27: qty 1

## 2023-11-27 MED ORDER — SUGAMMADEX SODIUM 200 MG/2ML IV SOLN
INTRAVENOUS | Status: DC | PRN
  Administered 2023-11-27: 147.2 mg via INTRAVENOUS

## 2023-11-27 MED ORDER — CEFAZOLIN SODIUM-DEXTROSE 2-3 GM-%(50ML) IV SOLR
INTRAVENOUS | Status: DC | PRN
  Administered 2023-11-27: 2 g via INTRAVENOUS

## 2023-11-27 MED ORDER — ACETAMINOPHEN 500 MG PO TABS
1000.0000 mg | ORAL_TABLET | ORAL | Status: AC
  Administered 2023-11-27: 1000 mg via ORAL

## 2023-11-27 MED ORDER — POVIDONE-IODINE 10 % EX SWAB: 2.0000 | Freq: Once | CUTANEOUS | Status: DC

## 2023-11-27 MED ORDER — ROCURONIUM BROMIDE 10 MG/ML (PF) SYRINGE
PREFILLED_SYRINGE | INTRAVENOUS | Status: DC | PRN
  Administered 2023-11-27: 30 mg via INTRAVENOUS

## 2023-11-27 MED ORDER — KETOROLAC TROMETHAMINE 30 MG/ML IJ SOLN
INTRAMUSCULAR | Status: DC | PRN
  Administered 2023-11-27: 30 mg via INTRAVENOUS

## 2023-11-27 MED ORDER — LACTATED RINGERS IV SOLN: INTRAVENOUS | Status: DC

## 2023-11-27 MED ORDER — SODIUM CHLORIDE 0.9 % IR SOLN
Status: DC | PRN
  Administered 2023-11-27 (×2): 1000 mL

## 2023-11-27 MED ORDER — HYDROMORPHONE HCL 1 MG/ML IJ SOLN
0.2500 mg | INTRAMUSCULAR | Status: DC | PRN
  Administered 2023-11-27: 0.5 mg via INTRAVENOUS

## 2023-11-27 MED ORDER — ACETAMINOPHEN 500 MG PO TABS
ORAL_TABLET | ORAL | Status: AC
  Filled 2023-11-27: qty 2

## 2023-11-27 MED ORDER — MORPHINE SULFATE (PF) 4 MG/ML IV SOLN
4.0000 mg | Freq: Once | INTRAVENOUS | Status: AC
  Administered 2023-11-27: 4 mg via INTRAVENOUS
  Filled 2023-11-27: qty 1

## 2023-11-27 MED ORDER — DROPERIDOL 2.5 MG/ML IJ SOLN: 0.6250 mg | Freq: Once | INTRAMUSCULAR | Status: DC | PRN

## 2023-11-27 MED ORDER — DEXAMETHASONE SODIUM PHOSPHATE 10 MG/ML IJ SOLN
INTRAMUSCULAR | Status: AC
Start: 2023-11-27 — End: 2023-11-27
  Filled 2023-11-27: qty 1

## 2023-11-27 MED ORDER — CEFAZOLIN SODIUM-DEXTROSE 2-4 GM/100ML-% IV SOLN
INTRAVENOUS | Status: AC
Start: 2023-11-27 — End: 2023-11-27
  Filled 2023-11-27: qty 100

## 2023-11-27 MED ORDER — BUPIVACAINE HCL (PF) 0.25 % IJ SOLN
INTRAMUSCULAR | Status: DC | PRN
  Administered 2023-11-27: 30 mL

## 2023-11-27 MED ORDER — LIDOCAINE 2% (20 MG/ML) 5 ML SYRINGE
INTRAMUSCULAR | Status: DC | PRN
  Administered 2023-11-27: 40 mg via INTRAVENOUS

## 2023-11-27 MED ORDER — FENTANYL CITRATE (PF) 250 MCG/5ML IJ SOLN
INTRAMUSCULAR | Status: AC
  Filled 2023-11-27: qty 5

## 2023-11-27 MED ORDER — ACETAMINOPHEN 10 MG/ML IV SOLN: 1000.0000 mg | Freq: Once | INTRAVENOUS | Status: DC | PRN

## 2023-11-27 MED ORDER — FENTANYL CITRATE (PF) 100 MCG/2ML IJ SOLN: 25.0000 ug | INTRAMUSCULAR | Status: DC | PRN

## 2023-11-27 MED ORDER — OXYCODONE HCL 5 MG/5ML PO SOLN: 5.0000 mg | Freq: Once | ORAL | Status: AC | PRN

## 2023-11-27 MED ORDER — LACTATED RINGERS IV BOLUS
1000.0000 mL | Freq: Once | INTRAVENOUS | Status: AC
  Administered 2023-11-27: 1000 mL via INTRAVENOUS

## 2023-11-27 MED ORDER — MIDAZOLAM HCL 2 MG/2ML IJ SOLN
INTRAMUSCULAR | Status: DC | PRN
  Administered 2023-11-27: 2 mg via INTRAVENOUS

## 2023-11-27 MED ORDER — MIDAZOLAM HCL 2 MG/2ML IJ SOLN
INTRAMUSCULAR | Status: AC
  Filled 2023-11-27: qty 2

## 2023-11-27 MED ORDER — SUCCINYLCHOLINE CHLORIDE 200 MG/10ML IV SOSY
PREFILLED_SYRINGE | INTRAVENOUS | Status: DC | PRN
  Administered 2023-11-27: 120 mg via INTRAVENOUS

## 2023-11-27 MED ORDER — HYDROMORPHONE HCL 1 MG/ML IJ SOLN
INTRAMUSCULAR | Status: AC
  Filled 2023-11-27: qty 1

## 2023-11-27 MED ORDER — SUCCINYLCHOLINE CHLORIDE 200 MG/10ML IV SOSY
PREFILLED_SYRINGE | INTRAVENOUS | Status: AC
  Filled 2023-11-27: qty 10

## 2023-11-27 MED ORDER — CHLORHEXIDINE GLUCONATE 0.12 % MT SOLN
OROMUCOSAL | Status: AC
Start: 2023-11-27 — End: 2023-11-27
  Filled 2023-11-27: qty 15

## 2023-11-27 MED ORDER — BUPIVACAINE HCL (PF) 0.25 % IJ SOLN
INTRAMUSCULAR | Status: AC
  Filled 2023-11-27: qty 30

## 2023-11-27 MED ORDER — PROPOFOL 10 MG/ML IV BOLUS
INTRAVENOUS | Status: DC | PRN
  Administered 2023-11-27: 140 mg via INTRAVENOUS

## 2023-11-27 MED ORDER — 0.9 % SODIUM CHLORIDE (POUR BTL) OPTIME
TOPICAL | Status: DC | PRN
  Administered 2023-11-27: 1000 mL

## 2023-11-27 MED ORDER — ONDANSETRON HCL 4 MG/2ML IJ SOLN
INTRAMUSCULAR | Status: AC
  Filled 2023-11-27: qty 2

## 2023-11-27 MED ORDER — FENTANYL CITRATE (PF) 250 MCG/5ML IJ SOLN
INTRAMUSCULAR | Status: DC | PRN
  Administered 2023-11-27 (×3): 50 ug via INTRAVENOUS

## 2023-11-27 MED ORDER — TRANEXAMIC ACID-NACL 1000-0.7 MG/100ML-% IV SOLN
1000.0000 mg | Freq: Once | INTRAVENOUS | Status: AC
  Administered 2023-11-27: 1000 mg via INTRAVENOUS

## 2023-11-27 MED ORDER — ROCURONIUM BROMIDE 10 MG/ML (PF) SYRINGE
PREFILLED_SYRINGE | INTRAVENOUS | Status: AC
  Filled 2023-11-27: qty 10

## 2023-11-27 MED ORDER — TRANEXAMIC ACID-NACL 1000-0.7 MG/100ML-% IV SOLN
INTRAVENOUS | Status: AC
  Filled 2023-11-27: qty 100

## 2023-11-27 MED ORDER — LIDOCAINE 2% (20 MG/ML) 5 ML SYRINGE
INTRAMUSCULAR | Status: AC
  Filled 2023-11-27: qty 5

## 2023-11-27 MED ORDER — CHLORHEXIDINE GLUCONATE 0.12 % MT SOLN
15.0000 mL | Freq: Once | OROMUCOSAL | Status: AC
  Administered 2023-11-27: 15 mL via OROMUCOSAL

## 2023-11-27 MED ORDER — DEXAMETHASONE SODIUM PHOSPHATE 10 MG/ML IJ SOLN
INTRAMUSCULAR | Status: DC | PRN
  Administered 2023-11-27: 10 mg via INTRAVENOUS

## 2023-11-27 MED ORDER — CHLORHEXIDINE GLUCONATE 0.12 % MT SOLN
OROMUCOSAL | Status: AC
  Filled 2023-11-27: qty 15

## 2023-11-27 MED ORDER — IBUPROFEN 600 MG PO TABS: 600.0000 mg | ORAL_TABLET | Freq: Four times a day (QID) | ORAL | 1 refills | Status: DC | PRN

## 2023-11-27 MED ORDER — ORAL CARE MOUTH RINSE: 15.0000 mL | Freq: Once | OROMUCOSAL | Status: AC

## 2023-11-27 SURGICAL SUPPLY — 30 items
APPLICATOR ARISTA FLEXITIP XL (MISCELLANEOUS) IMPLANT
CATH FOLEY LF 14FR (CATHETERS) IMPLANT
CATH ROBINSON RED A/P 16FR (CATHETERS) IMPLANT
DERMABOND ADVANCED .7 DNX12 (GAUZE/BANDAGES/DRESSINGS) ×1 IMPLANT
DRAPE SURG IRRIG POUCH 19X23 (DRAPES) ×1 IMPLANT
DRSG OPSITE POSTOP 3X4 (GAUZE/BANDAGES/DRESSINGS) IMPLANT
DURAPREP 26ML APPLICATOR (WOUND CARE) ×1 IMPLANT
GLOVE BIO SURGEON STRL SZ 6.5 (GLOVE) ×1 IMPLANT
GLOVE BIOGEL PI IND STRL 7.0 (GLOVE) ×1 IMPLANT
GOWN STRL REUS W/ TWL LRG LVL3 (GOWN DISPOSABLE) ×2 IMPLANT
HEMOSTAT ARISTA ABSORB 3G PWDR (HEMOSTASIS) IMPLANT
IRRIGATION SUCT STRKRFLW 2 WTP (MISCELLANEOUS) IMPLANT
KIT PINK PAD W/HEAD ARM REST (MISCELLANEOUS) ×1 IMPLANT
KIT TURNOVER KIT B (KITS) ×1 IMPLANT
MANIPULATOR UTERINE 7CM CLEARV (MISCELLANEOUS) IMPLANT
NS IRRIG 1000ML POUR BTL (IV SOLUTION) ×1 IMPLANT
PACK LAPAROSCOPY BASIN (CUSTOM PROCEDURE TRAY) ×1 IMPLANT
SET TUBE SMOKE EVAC HIGH FLOW (TUBING) ×1 IMPLANT
SHEARS HARMONIC 36 ACE (MISCELLANEOUS) IMPLANT
SLEEVE Z-THREAD 5X100MM (TROCAR) ×1 IMPLANT
SOLUTION ELECTROSURG ANTI STCK (MISCELLANEOUS) IMPLANT
SPIKE FLUID TRANSFER (MISCELLANEOUS) ×1 IMPLANT
SUT VIC AB 3-0 PS2 18XBRD (SUTURE) ×1 IMPLANT
SUT VICRYL 0 UR6 27IN ABS (SUTURE) IMPLANT
SYSTEM BAG RETRIEVAL 10MM (BASKET) IMPLANT
SYSTEM CARTER THOMASON II (TROCAR) IMPLANT
TOWEL GREEN STERILE FF (TOWEL DISPOSABLE) ×2 IMPLANT
TROCAR 11X100 Z THREAD (TROCAR) IMPLANT
TROCAR XCEL NON-BLD 5MMX100MML (ENDOMECHANICALS) ×1 IMPLANT
WARMER LAPAROSCOPE (MISCELLANEOUS) ×1 IMPLANT

## 2023-11-27 NOTE — Discharge Instructions (Addendum)
 Call office with any concerns [phone]

## 2023-11-27 NOTE — Anesthesia Postprocedure Evaluation (Signed)
 Anesthesia Post Note  Patient: Kristin Kemp  Procedure(s) Performed: laparoscopic right salpingectomy for ectopic pregnancy     Patient location during evaluation: PACU Anesthesia Type: General Level of consciousness: awake and alert Pain management: pain level controlled Vital Signs Assessment: post-procedure vital signs reviewed and stable Respiratory status: spontaneous breathing, nonlabored ventilation, respiratory function stable and patient connected to nasal cannula oxygen Cardiovascular status: blood pressure returned to baseline and stable Postop Assessment: no apparent nausea or vomiting Anesthetic complications: no   No notable events documented.  Last Vitals:  Vitals:   11/27/23 1130 11/27/23 1145  BP: 107/83 113/71  Pulse: 84 75  Resp: 20 15  Temp:    SpO2: 100% 100%    Last Pain:  Vitals:   11/27/23 1200  TempSrc:   PainSc: 5                  Franky JONETTA Bald

## 2023-11-27 NOTE — Anesthesia Preprocedure Evaluation (Addendum)
 Anesthesia Evaluation  Patient identified by MRN, date of birth, ID band Patient awake    Reviewed: Allergy & Precautions, NPO status , Patient's Chart, lab work & pertinent test results  Airway Mallampati: II  TM Distance: >3 FB Neck ROM: Full    Dental  (+) Teeth Intact, Dental Advisory Given   Pulmonary Current Smoker   breath sounds clear to auscultation       Cardiovascular negative cardio ROS  Rhythm:Regular Rate:Normal     Neuro/Psych  PSYCHIATRIC DISORDERS Anxiety Depression       GI/Hepatic negative GI ROS, Neg liver ROS,,,  Endo/Other  negative endocrine ROS    Renal/GU negative Renal ROS     Musculoskeletal negative musculoskeletal ROS (+)    Abdominal   Peds  Hematology  (+) Blood dyscrasia, anemia   Anesthesia Other Findings   Reproductive/Obstetrics                              Anesthesia Physical Anesthesia Plan  ASA: 2 and emergent  Anesthesia Plan: General   Post-op Pain Management: Toradol  IV (intra-op)* and Ofirmev  IV (intra-op)*   Induction: Intravenous, Rapid sequence and Cricoid pressure planned  PONV Risk Score and Plan: 3 and Ondansetron , Dexamethasone  and Midazolam   Airway Management Planned: Oral ETT  Additional Equipment: None  Intra-op Plan:   Post-operative Plan: Extubation in OR  Informed Consent: I have reviewed the patients History and Physical, chart, labs and discussed the procedure including the risks, benefits and alternatives for the proposed anesthesia with the patient or authorized representative who has indicated his/her understanding and acceptance.     Dental advisory given  Plan Discussed with: CRNA  Anesthesia Plan Comments: (Lab Results      Component                Value               Date                      WBC                      8.0                 11/27/2023                HGB                      12.8                 11/27/2023                HCT                      36.7                11/27/2023                MCV                      82.1                11/27/2023                PLT                      306  11/27/2023           )         Anesthesia Quick Evaluation

## 2023-11-27 NOTE — H&P (Signed)
 Kristin Kemp is a 33 y.o. G67P2012 female with recent +pregnancy test who presented to MAU today with complaint of worsening right sided adnexal mass and persistent vaginal bleeding She was on depo provera  but switched to ocps recently. Admits to infrequent use as had personal issues ( planning funeral for her uncle, school etc).  Quant HCG noted at 1500 and US  noted a right adnexal mass consistent with ectopic pregnancy, no IUP.  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 She is NPO x >8hrs   Pertinent Gynecological History: Menses: persistent since 10/27/2023 Bleeding: dysfunctional uterine bleeding Contraception: OCP (estrogen/progesterone) DES exposure: denies Blood transfusions: none Sexually transmitted diseases: past history: HSV nad GC Previous GYN Procedures: n/a  Last mammogram: n/a Date:   Last pap: normal Date: 2023 OB History: G4, P2012   Menstrual History: Menarche age: 51 Patient's last menstrual period was 10/27/2023 (approximate).    Past Medical History:  Diagnosis Date   Abnormal Pap smear    Allergy    Anemia    Herpes    Late prenatal care    Ovarian cyst    PID (pelvic inflammatory disease)    Urinary tract infection     Past Surgical History:  Procedure Laterality Date   CESAREAN SECTION N/A 04/10/2018   Procedure: CESAREAN SECTION;  Surgeon: Estelle Service, MD;  Location: [facility];  Service: Obstetrics;  Laterality: N/A;   DILATION AND CURETTAGE OF UTERUS  2009    Family History  Problem Relation Age of Onset   Hypertension Maternal Grandmother    Hyperlipidemia Maternal Grandmother    Heart disease Maternal Grandmother        enlarged heart   Diabetes Maternal Grandfather    Diabetes Maternal Uncle    Hypertension Maternal Uncle    Kidney disease Maternal Uncle    Cancer Paternal Aunt        lung   Cancer Paternal Uncle        lung   Anesthesia problems Neg Hx     Social History:   reports that she has been smoking cigarettes. She uses smokeless tobacco. She reports that she does not currently use alcohol. She reports that she does not currently use drugs after having used the following drugs: Marijuana.  Allergies:  Allergies  Allergen Reactions   Chocolate Hives and Itching   Cinnamon Other (See Comments)    Turns tongue raw.   Latex Other (See Comments)    Reports vaginal sensitivity after latex condom use.    Medications Prior to Admission  Medication Sig Dispense Refill Last Dose/Taking   Multiple Vitamin (MULTIVITAMIN) LIQD Take 5 mLs by mouth daily.      ondansetron  (ZOFRAN -ODT) 4 MG disintegrating tablet Take 1 tablet (4 mg total) by mouth every 8 (eight) hours as needed for nausea or vomiting. 10 tablet 0     Review of Systems  Constitutional:  Positive for activity change. Negative for fatigue.  Respiratory:  Negative for chest tightness and shortness of breath.   Cardiovascular:  Negative for chest pain, palpitations and leg swelling.  Gastrointestinal:  Positive for abdominal pain. Negative for abdominal distention and nausea.  Genitourinary:  Positive for menstrual problem, pelvic pain and vaginal bleeding.  Musculoskeletal:  Negative for back pain.  Psychiatric/Behavioral:  The patient is nervous/anxious.     Blood pressure (!) 142/92, pulse 68, temperature 97.7 F (36.5 C), temperature source Oral, resp. rate 18, height 5' 3 (1.6 m), weight  73.6 kg, last menstrual period 10/27/2023, SpO2 100%. Physical Exam Constitutional:      Appearance: She is well-developed and normal weight.  Abdominal:     General: Abdomen is flat. A surgical scar is present.     Tenderness: There is generalized abdominal tenderness and tenderness in the right lower quadrant. There is guarding.  Skin:    General: Skin is warm and dry.     Capillary Refill: Capillary refill takes 2 to 3 seconds.  Neurological:     General: No focal deficit present.     Mental  Status: She is alert and oriented to person, place, and time.  Psychiatric:        Mood and Affect: Mood is anxious.        Behavior: Behavior normal.     Results for orders placed or performed during the hospital encounter of 11/27/23 (from the past 24 hours)  hCG, quantitative, pregnancy     Status: Abnormal   Collection Time: 11/27/23  6:10 AM  Result Value Ref Range   hCG, Beta Chain, Quant, S 1,579 (H) <5 mIU/mL  Type and screen     Status: None (Preliminary result)   Collection Time: 11/27/23  7:05 AM  Result Value Ref Range   ABO/RH(D) PENDING    Antibody Screen PENDING    Sample Expiration      11/30/2023,2359 Performed at [facility], [address], [place], [place] [address]   CBC     Status: None   Collection Time: 11/27/23  7:10 AM  Result Value Ref Range   WBC 8.0 4.0 - 10.5 K/uL   RBC 4.47 3.87 - 5.11 MIL/uL   Hemoglobin 12.8 12.0 - 15.0 g/dL   HCT 63.2 63.9 - 53.9 %   MCV 82.1 80.0 - 100.0 fL   MCH 28.6 26.0 - 34.0 pg   MCHC 34.9 30.0 - 36.0 g/dL   RDW 85.3 88.4 - 84.4 %   Platelets 306 150 - 400 K/uL   nRBC 0.0 0.0 - 0.2 %  Comprehensive metabolic panel     Status: Abnormal   Collection Time: 11/27/23  7:10 AM  Result Value Ref Range   Sodium 137 135 - 145 mmol/L   Potassium 4.0 3.5 - 5.1 mmol/L   Chloride 110 98 - 111 mmol/L   CO2 20 (L) 22 - 32 mmol/L   Glucose, Bld 109 (H) 70 - 99 mg/dL   BUN 9 6 - 20 mg/dL   Creatinine, Ser 9.31 0.44 - 1.00 mg/dL   Calcium 8.8 (L) 8.9 - 10.3 mg/dL   Total Protein 5.9 (L) 6.5 - 8.1 g/dL   Albumin 3.3 (L) 3.5 - 5.0 g/dL   AST 28 15 - 41 U/L   ALT 33 0 - 44 U/L   Alkaline Phosphatase 48 38 - 126 U/L   Total Bilirubin 0.6 0.0 - 1.2 mg/dL   GFR, Estimated >39 >39 mL/min   Anion gap 7 5 - 15    US  OB Transvaginal Result Date: 11/27/2023 EXAM: OBSTETRIC ULTRASOUND FIRST TRIMESTER TECHNIQUE: Transvaginal first trimester obstetric pelvic duplex ultrasound was performed with real-time imaging, color flow  Doppler imaging, and spectral analysis. COMPARISON: 11/24/2023 CLINICAL HISTORY: Abdominal pain in pregnancy. FINDINGS: UTERUS: No focal myometrial mass. GESTATIONAL SAC(S): Within the right adnexa, there is a mass with echogenic rim and increased vascularity measuring 1.2 x 1.2 x 1.3 cm. This appears separate from the right ovary. No fetal pole identified within this structure. There may be  a small internal gestational sac within the structure, but no fetal pole is identified. YOLK SAC: Not identified. EMBRYO(<11WK) /FETUS(>=11WK): Not identified. CROWN RUMP LENGTH: Not measured. RATE OF CARDIAC ACTIVITY: Not measured. RIGHT OVARY: Separate from the adnexal mass. LEFT OVARY: Unremarkable. Normal arterial and venous flow. FREE FLUID: Moderate free fluid noted within the pelvis. MEASUREMENTS ESTIMATED GESTATIONAL AGE BY CURRENT ULTRASOUND: Not estimated. ESTIMATED GESTATIONAL AGE BY LMP/PRIOR ULTRASOUND: Not provided. ESTIMATED DUE DATE: Not provided. IMPRESSION: 1. No signs of intrauterine gestation. 2. Right adnexal mass with echogenic rim and increased vascularity, measuring 1.2 x 1.2 x 1.3 cm, separate from the right ovary. There may be a small internal gestational sac within the structure. Findings are concerning for right ectopic pregnancy. 3. Moderate free fluid within the pelvis. 4. Critical value results were communicated to the clinical service Report was given to Harlene Duncans, at 07:27 am on 11/27/2023 Electronically signed by: Waddell Calk MD 11/27/2023 07:28 AM EDT RP Workstation: [id number]    Assessment/Plan: 63bn [id number] female with right adnexal mass consistent with right ectopic pregnancy here for laparoscopic removal, possible right salpingectomy and/or oopherectomy - Admit  - ERAS protocol - Consent confirmed - To OR when ready   Rayansh Herbst W Emmi Wertheim 11/27/2023, 8:18 AM

## 2023-11-27 NOTE — Anesthesia Procedure Notes (Signed)
 Procedure Name: Intubation Date/Time: 11/27/2023 9:38 AM  Performed by: Vertie Arthea RAMAN, CRNAPre-anesthesia Checklist: Patient identified, Emergency Drugs available, Suction available and Patient being monitored Patient Re-evaluated:Patient Re-evaluated prior to induction Oxygen Delivery Method: Circle System Utilized Preoxygenation: Pre-oxygenation with 100% oxygen Induction Type: IV induction, Cricoid Pressure applied and Rapid sequence Laryngoscope Size: Miller and 2 Grade View: Grade II Tube type: Oral Tube size: 7.0 mm Number of attempts: 1 Airway Equipment and Method: Stylet and Oral airway Placement Confirmation: ETT inserted through vocal cords under direct vision, positive ETCO2 and breath sounds checked- equal and bilateral Tube secured with: Tape Dental Injury: Teeth and Oropharynx as per pre-operative assessment

## 2023-11-27 NOTE — Progress Notes (Signed)
 Pt to main OR in [facility] via stretcher by transport.

## 2023-11-27 NOTE — Transfer of Care (Signed)
 Immediate Anesthesia Transfer of Care Note  Patient: Kristin Kemp  Procedure(s) Performed: laparoscopic right salpingectomy for ectopic pregnancy  Patient Location: PACU  Anesthesia Type:General  Level of Consciousness: awake, alert , and oriented  Airway & Oxygen Therapy: Patient Spontanous Breathing  Post-op Assessment: Report given to RN and Post -op Vital signs reviewed and stable  Post vital signs: Reviewed and stable  Last Vitals:  Vitals Value Taken Time  BP 116/78 11/27/23 11:10  Temp    Pulse 90 11/27/23 11:14  Resp 14 11/27/23 11:14  SpO2 100 % 11/27/23 11:14  Vitals shown include unfiled device data.  Last Pain:  Vitals:   11/27/23 1110  TempSrc:   PainSc: Asleep         Complications: No notable events documented.

## 2023-11-27 NOTE — MAU Provider Note (Signed)
 History     CSN: [id number]  Arrival date and time: 11/27/23 0540   Event Date/Time   First Provider Initiated Contact with Patient 11/27/23 [phone]      Chief Complaint  Patient presents with   Abdominal Pain   Vaginal Bleeding   repeat hCG    Kristin Kemp is a 33 y.o. [id number] at [redacted]w[redacted]d who receives care at [facility].  She presents today for abdominal pain and vaginal bleeding.  She states she has been evaluated for suspected ectopic pregnancy and her symptoms have been ongoing.  However, she states today her pain worsened causing her to awake at 0500.  She also reports onset of back pain.  She states the pain is sharp and constant, but was initially intermittent. She rates her pain a 10/10.  She reports her bleeding is also heavier with some clots. She denies nausea or vomiting.   OB History     Gravida  4   Para  2   Term  2   Preterm      AB  1   Living  2      SAB  1   IAB      Ectopic      Multiple  0   Live Births  2           Past Medical History:  Diagnosis Date   Abnormal Pap smear    Allergy    Anemia    Herpes    Late prenatal care    Ovarian cyst    PID (pelvic inflammatory disease)    Urinary tract infection     Past Surgical History:  Procedure Laterality Date   CESAREAN SECTION N/A 04/10/2018   Procedure: CESAREAN SECTION;  Surgeon: Estelle Service, MD;  Location: [facility];  Service: Obstetrics;  Laterality: N/A;   DILATION AND CURETTAGE OF UTERUS  2009    Family History  Problem Relation Age of Onset   Hypertension Maternal Grandmother    Hyperlipidemia Maternal Grandmother    Heart disease Maternal Grandmother        enlarged heart   Diabetes Maternal Grandfather    Diabetes Maternal Uncle    Hypertension Maternal Uncle    Kidney disease Maternal Uncle    Cancer Paternal Aunt        lung   Cancer Paternal Uncle        lung   Anesthesia problems Neg Hx     Social History   Tobacco Use    Smoking status: Some Days    Current packs/day: 0.00    Types: Cigarettes    Last attempt to quit: 10/21/2010    Years since quitting: 13.1   Smokeless tobacco: Current  Vaping Use   Vaping status: Never Used  Substance Use Topics   Alcohol use: Not Currently   Drug use: Not Currently    Types: Marijuana    Comment: occasionally    Allergies:  Allergies  Allergen Reactions   Chocolate Hives and Itching   Cinnamon Other (See Comments)    Turns tongue raw.   Latex Other (See Comments)    Reports vaginal sensitivity after latex condom use.    Medications Prior to Admission  Medication Sig Dispense Refill Last Dose/Taking   Multiple Vitamin (MULTIVITAMIN) LIQD Take 5 mLs by mouth daily.      ondansetron  (ZOFRAN -ODT) 4 MG disintegrating tablet Take 1 tablet (4 mg total) by mouth every 8 (eight) hours as needed for nausea  or vomiting. 10 tablet 0     Review of Systems  Constitutional:  Negative for chills and fever.  Gastrointestinal:  Positive for abdominal pain and constipation. Negative for diarrhea, nausea and vomiting.  Genitourinary:  Positive for vaginal bleeding. Negative for difficulty urinating, dysuria and vaginal discharge.   Physical Exam   Blood pressure 121/85, pulse 81, temperature 98.4 F (36.9 C), temperature source Oral, resp. rate 20, height 5' 3 (1.6 m), weight 73.6 kg, last menstrual period 10/27/2023, SpO2 100%.  Physical Exam Vitals and nursing note reviewed. Exam conducted with a chaperone present Merri, Charity fundraiser).  Constitutional:      General: She is in acute distress.     Appearance: She is well-developed. She is not ill-appearing.  HENT:     Head: Normocephalic and atraumatic.  Eyes:     Conjunctiva/sclera: Conjunctivae normal.  Cardiovascular:     Rate and Rhythm: Normal rate.  Pulmonary:     Effort: Pulmonary effort is normal. No respiratory distress.  Abdominal:     General: Abdomen is flat.  Musculoskeletal:        General: Normal range  of motion.     Cervical back: Normal range of motion.  Skin:    General: Skin is warm and dry.  Neurological:     Mental Status: She is alert and oriented to person, place, and time.  Psychiatric:        Mood and Affect: Mood normal.        Behavior: Behavior normal.     Comments: Tearful     MAU Course  Procedures Results for orders placed or performed during the hospital encounter of 11/27/23 (from the past 24 hours)  hCG, quantitative, pregnancy     Status: Abnormal   Collection Time: 11/27/23  6:10 AM  Result Value Ref Range   hCG, Beta Chain, Quant, S 1,579 (H) <5 mIU/mL   US  OB Transvaginal Result Date: 11/27/2023 EXAM: OBSTETRIC ULTRASOUND FIRST TRIMESTER TECHNIQUE: Transvaginal first trimester obstetric pelvic duplex ultrasound was performed with real-time imaging, color flow Doppler imaging, and spectral analysis. COMPARISON: 11/24/2023 CLINICAL HISTORY: Abdominal pain in pregnancy. FINDINGS: UTERUS: No focal myometrial mass. GESTATIONAL SAC(S): Within the right adnexa, there is a mass with echogenic rim and increased vascularity measuring 1.2 x 1.2 x 1.3 cm. This appears separate from the right ovary. No fetal pole identified within this structure. There may be a small internal gestational sac within the structure, but no fetal pole is identified. YOLK SAC: Not identified. EMBRYO(<11WK) /FETUS(>=11WK): Not identified. CROWN RUMP LENGTH: Not measured. RATE OF CARDIAC ACTIVITY: Not measured. RIGHT OVARY: Separate from the adnexal mass. LEFT OVARY: Unremarkable. Normal arterial and venous flow. FREE FLUID: Moderate free fluid noted within the pelvis. MEASUREMENTS ESTIMATED GESTATIONAL AGE BY CURRENT ULTRASOUND: Not estimated. ESTIMATED GESTATIONAL AGE BY LMP/PRIOR ULTRASOUND: Not provided. ESTIMATED DUE DATE: Not provided. IMPRESSION: 1. No signs of intrauterine gestation. 2. Right adnexal mass with echogenic rim and increased vascularity, measuring 1.2 x 1.2 x 1.3 cm, separate from the  right ovary. There may be a small internal gestational sac within the structure. Findings are concerning for right ectopic pregnancy. 3. Moderate free fluid within the pelvis. 4. Critical value results were communicated to the clinical service Report was given to Harlene Duncans, at 07:27 am on 11/27/2023 Electronically signed by: Waddell Calk MD 11/27/2023 07:28 AM EDT RP Workstation: [id number]     MDM Physical Exam Labs: CBC, CMP, hCG, ABO Ultrasound Start IV LR Bolus Pain Medication Assessment and  Plan  33 year old  G4P2012 at 4.3 wks RLQ Pain  -POC Reviewed.  -Exam performed and findings discussed. -Patient informed of provider concern for ruptured ectopic pregnancy. -Discussed collecting labs, IV fluids, and US . -Will also give pain medication now. -Patient verbalizes understanding and without questions.   Harlene LITTIE Duncans 11/27/2023, 6:22 AM   Reassessment (7:24 AM) -Dr. IVAR Calk contracts provider and reports critical value: *Right ectopic with moderate free fluid.  -Dr. KYM Solo contacted and informed of radiology report.  *States she is in building and will come down to discuss with patient.  -Patient informed of US  results and that primary ob is coming to bedside.  -Nurses updated on POC.  Harlene LITTIE Duncans MSN, CNM Advanced Practice Provider, [facility]

## 2023-11-27 NOTE — MAU Note (Signed)
 MAU Triage Note:  .[name] is a 33 y.o. at [redacted]w[redacted]d here in MAU reporting: here for repeat hCG with worsening abdominal pain on her right side. Has continued to have on-going VB for the past month. Currently wearing a panti-liner.   Patient complaint: spotting, cramping  Pain Score: 10-Worst pain ever Pain Location: Abdomen    NPO for the last 6-7 hours.  Onset of complaint: On-going LMP: Patient's last menstrual period was 10/27/2023 (approximate).  Vitals:   11/27/23 0611  BP: 121/85  Pulse: 81  Resp: 20  Temp: 98.4 F (36.9 C)  SpO2: 100%     Lab orders placed from triage: labs in

## 2023-11-27 NOTE — Op Note (Signed)
 Operative Note    Preoperative Diagnosis 1 Right adnexal mass, suspect ectopic pregnancy  2. Abdominal pain worse on right side   Postoperative Diagnosis Dilated right fallopian tube with ectopic pregnancy  Abdominal pain     Procedure: Laparoscopic right salpingectomy with removal of ectopic pregnancy    Surgeon: Delana BROCKS DO  Assist : Surgical scrub Gustav   Anesthesia: General anesthesia and local anesthesia with 0.25% marcaine   Fluids: LR EBL: UOP: Meds: Ancef , TXA   Findings: Dilated right fallopian tube with ectopic pregnancy, intact, partially expulsed portion of ectopic pregnancy from end of tube onto right ovary. Grossly normal uterus, left fallopian tube, ovary and appendix   Specimen: Right fallopian tube and ectopic pregnancy to pathology    Procedure Note   Patient was taken to the operating room. She was placed in the dorsal supine position with her arms tucked at her sides. General anesthesia was administered and found to be adequate. She was then placed in the dorsal lithotomy position and prepped and draped in the normal sterile fashion. An appropriate timeout was performed.  A speculum was then placed in the vagina and a uterine manipulator placed. The bladder was emptied. Attention was then turned to the patient's abdomen after draping where the infraumbilical area was injected with approximately 5cc of quarter percent Marcaine . A 10 mm incision was then made under the umbilicus and the optiview trocar and camera easily introduced into the abdominal cavity with the abdomen tented upwards. Pneumoperitoneum obtained with approximate 3 L of CO2 gas. Next a 5mm trocar was placed in both the patients left, then right,  lower uterine quadrant under direct visualization.   With patient in Trendelenburg, the pelvic cavity was noted to have a moderate amount of blood and clots. Once these had been evacuated with the suction, the left fallopian tube and  ovary were confirmed to be normal in appearance however the right fallopian tube was noted to be significantly dilated and clot and tissue noted to be extruding from its fimbriated ends and onto the right ovary.  It was determined the right fallopian tube would need to be excised to effect removal of the ectopic pregnancy. Using the harmonic scalpel, the right fallopian tube was fully excised with the ectopic pregnancy. A small bleeding vessel was noted from the ovary. Hemostasis was achieved with cautery.  The evacuated products were removed and sent to pathology. Copious irrigation of the pelvic cavity was performed and hemostasis reconfirmed.   At this point,  the patient was flattened and the trocar in left quadrant removed under visualization. Pneumoperitoneum was reduced with extra breaths given by anesthesia to enhance reduction of abdominal gas.  The umbilical trocar was then removed. The incision sites were closed with 4-0 vicryl suture and dermabond.   Patient was then awakened and taken to the recovery room in good condition. Counts were all confirmed per nursing x 2

## 2023-11-28 ENCOUNTER — Other Ambulatory Visit: Payer: Self-pay

## 2023-11-28 ENCOUNTER — Encounter (HOSPITAL_COMMUNITY): Payer: Self-pay | Admitting: Obstetrics and Gynecology

## 2023-11-28 LAB — GC/CHLAMYDIA PROBE AMP (~~LOC~~) NOT AT ARMC
Chlamydia: NEGATIVE
Comment: NEGATIVE
Comment: NORMAL
Neisseria Gonorrhea: NEGATIVE

## 2023-11-29 LAB — SURGICAL PATHOLOGY

## 2023-12-01 ENCOUNTER — Ambulatory Visit (INDEPENDENT_AMBULATORY_CARE_PROVIDER_SITE_OTHER): Admitting: Primary Care

## 2023-12-01 ENCOUNTER — Encounter (INDEPENDENT_AMBULATORY_CARE_PROVIDER_SITE_OTHER): Payer: Self-pay | Admitting: Primary Care

## 2023-12-01 VITALS — BP 118/74 | HR 87 | Temp 98.1°F | Resp 16 | Ht 63.0 in | Wt 166.0 lb

## 2023-12-01 DIAGNOSIS — L242 Irritant contact dermatitis due to solvents: Secondary | ICD-10-CM

## 2023-12-01 MED ORDER — TRIAMCINOLONE ACETONIDE 0.1 % EX CREA: TOPICAL_CREAM | Freq: Three times a day (TID) | CUTANEOUS | 1 refills | Status: AC | PRN

## 2023-12-01 MED ORDER — TRIAMCINOLONE ACETONIDE 0.1 % EX CREA: TOPICAL_CREAM | Freq: Three times a day (TID) | CUTANEOUS | 1 refills | Status: DC | PRN

## 2023-12-01 NOTE — Progress Notes (Signed)
 [facility]  Kristin Kemp, is a 33 y.o. female  RDW:[id number]  FMW:[id number]  DOB - 03-Mar-1991  Chief Complaint  Patient presents with   Rash    Rash x 2 mos Taking hydrocortisone cream and poison ivy wash       Subjective:   Kristin Kemp is a 33 y.o. female here today for an acute visit.  Rash This is a recurrent problem. The current episode started more than 1 month ago. The problem has been gradually worsening since onset. The affected locations include the right arm and left arm. The rash is characterized by dryness, itchiness and scaling. She was exposed to a new detergent/soap. Past treatments include topical steroids and moisturizer. Improvement on treatment: worst.    No problems updated.  Comprehensive ROS Pertinent positive and negative noted in HPI   Allergies  Allergen Reactions   Chocolate Hives and Itching   Cinnamon Other (See Comments)    Turns tongue raw.   Latex Other (See Comments)    Reports vaginal sensitivity after latex condom use.    Past Medical History:  Diagnosis Date   Abnormal Pap smear    Allergy    Anemia    Depression    Herpes    Late prenatal care    Ovarian cyst    PID (pelvic inflammatory disease)    Urinary tract infection     Current Outpatient Medications on File Prior to Visit  Medication Sig Dispense Refill   ibuprofen  (ADVIL ) 600 MG tablet Take 1 tablet (600 mg total) by mouth every 6 (six) hours as needed for moderate pain (pain score 4-6) or cramping. 30 tablet 1   Multiple Vitamin (MULTIVITAMIN) LIQD Take 5 mLs by mouth daily.     ondansetron  (ZOFRAN -ODT) 4 MG disintegrating tablet Take 1 tablet (4 mg total) by mouth every 8 (eight) hours as needed for nausea or vomiting. (Patient not taking: Reported on 12/01/2023) 10 tablet 0   No current facility-administered medications on file prior to visit.   Health Maintenance  Topic Date Due   Pneumococcal Vaccine for high risk medical condition  (1 of 2 - PCV) Never done   Hepatitis B Vaccine (1 of 3 - 19+ 3-dose series) Never done   HPV Vaccine (1 - 3-dose SCDM series) Never done   COVID-19 Vaccine (1 - 2024-25 season) Never done   Eye exam for diabetics  06/22/2023   Flu Shot  11/25/2023   Pap with HPV screening  08/14/2024   DTaP/Tdap/Td vaccine (3 - Td or Tdap) 02/11/2028   Hepatitis C Screening  Completed   HIV Screening  Completed   Meningitis B Vaccine  Aged Out    Objective:   Vitals:   12/01/23 0853 12/01/23 0900  BP: 132/86 118/74  Pulse: 87   Resp: 16   Temp: 98.1 F (36.7 C)   TempSrc: Oral   SpO2: 99%   Weight: 166 lb (75.3 kg)   Height: 5' 3 (1.6 m)      Physical Exam Vitals reviewed.  Constitutional:      Appearance: Normal appearance.  HENT:     Right Ear: Tympanic membrane and external ear normal.     Left Ear: Tympanic membrane and external ear normal.     Nose: Nose normal.  Eyes:     Extraocular Movements: Extraocular movements intact.     Pupils: Pupils are equal, round, and reactive to light.  Cardiovascular:     Rate and Rhythm: Normal rate and regular  rhythm.  Pulmonary:     Effort: Pulmonary effort is normal.  Abdominal:     General: Bowel sounds are normal.     Palpations: Abdomen is soft.  Musculoskeletal:        General: Normal range of motion.     Cervical back: Normal range of motion.  Skin:    General: Skin is warm and dry.     Findings: Rash present.  Neurological:     Mental Status: She is alert and oriented to person, place, and time.  Psychiatric:        Mood and Affect: Mood normal.        Behavior: Behavior normal.     Assessment & Plan   Kristin Kemp was seen today for rash.  Diagnoses and all orders for this visit:  Irritant contact dermatitis due to solvent  -     Discontinue: triamcinolone  0.1%-Cetaphil equivalent 1:4 cream mixture; Apply topically 3 (three) times daily as needed. -     triamcinolone  0.1%-Cetaphil equivalent 1:4 cream mixture;  Apply topically 3 (three) times daily as needed.     Patient have been counseled extensively about nutrition and exercise. Other issues discussed during this visit include: low cholesterol diet, weight control and daily exercise, foot care, annual eye examinations at Ophthalmology, importance of adherence with medications and regular follow-up. We also discussed long term complications of uncontrolled diabetes and hypertension.   Return in about 6 months (around 06/02/2024).  The patient was given clear instructions to go to ER or return to medical center if symptoms don't improve, worsen or new problems develop. The patient verbalized understanding. The patient was told to call to get lab results if they haven't heard anything in the next week.   This note has been created with Education officer, environmental. Any transcriptional errors are unintentional.   Kristin SHAUNNA Bohr, NP 12/04/2023, 11:59 PM

## 2023-12-11 ENCOUNTER — Encounter (INDEPENDENT_AMBULATORY_CARE_PROVIDER_SITE_OTHER): Payer: Self-pay | Admitting: Primary Care

## 2023-12-11 ENCOUNTER — Encounter (HOSPITAL_COMMUNITY): Payer: Self-pay

## 2023-12-11 ENCOUNTER — Other Ambulatory Visit: Payer: Self-pay

## 2023-12-11 ENCOUNTER — Encounter (HOSPITAL_COMMUNITY): Payer: Self-pay | Admitting: *Deleted

## 2023-12-11 ENCOUNTER — Emergency Department (HOSPITAL_COMMUNITY)

## 2023-12-11 ENCOUNTER — Emergency Department (HOSPITAL_COMMUNITY)
Admission: EM | Admit: 2023-12-11 | Discharge: 2023-12-11 | Disposition: A | Discharge status: Home / Self Care | Attending: Emergency Medicine | Admitting: Emergency Medicine

## 2023-12-11 DIAGNOSIS — G8918 Other acute postprocedural pain: Secondary | ICD-10-CM | POA: Insufficient documentation

## 2023-12-11 DIAGNOSIS — Z91040 Latex allergy status: Secondary | ICD-10-CM | POA: Insufficient documentation

## 2023-12-11 DIAGNOSIS — D72829 Elevated white blood cell count, unspecified: Secondary | ICD-10-CM | POA: Insufficient documentation

## 2023-12-11 DIAGNOSIS — E860 Dehydration: Secondary | ICD-10-CM | POA: Insufficient documentation

## 2023-12-11 DIAGNOSIS — R1033 Periumbilical pain: Secondary | ICD-10-CM | POA: Diagnosis present

## 2023-12-11 DIAGNOSIS — K429 Umbilical hernia without obstruction or gangrene: Secondary | ICD-10-CM | POA: Insufficient documentation

## 2023-12-11 DIAGNOSIS — R109 Unspecified abdominal pain: Secondary | ICD-10-CM | POA: Diagnosis not present

## 2023-12-11 LAB — URINALYSIS, ROUTINE W REFLEX MICROSCOPIC
Bilirubin Urine: NEGATIVE
Glucose, UA: NEGATIVE mg/dL
Hgb urine dipstick: NEGATIVE
Ketones, ur: 5 mg/dL — AB
Leukocytes,Ua: NEGATIVE
Nitrite: NEGATIVE
Protein, ur: NEGATIVE mg/dL
Specific Gravity, Urine: 1.014 (ref 1.005–1.030)
pH: 5 (ref 5.0–8.0)

## 2023-12-11 LAB — COMPREHENSIVE METABOLIC PANEL WITH GFR
ALT: 24 U/L (ref 0–44)
AST: 25 U/L (ref 15–41)
Albumin: 4.2 g/dL (ref 3.5–5.0)
Alkaline Phosphatase: 54 U/L (ref 38–126)
Anion gap: 11 (ref 5–15)
BUN: 6 mg/dL (ref 6–20)
CO2: 21 mmol/L — ABNORMAL LOW (ref 22–32)
Calcium: 10 mg/dL (ref 8.9–10.3)
Chloride: 106 mmol/L (ref 98–111)
Creatinine, Ser: 0.81 mg/dL (ref 0.44–1.00)
GFR, Estimated: >60 mL/min (ref >60)
Glucose, Bld: 114 mg/dL — ABNORMAL HIGH (ref 70–99)
Potassium: 4 mmol/L (ref 3.5–5.1)
Sodium: 138 mmol/L (ref 135–145)
Total Bilirubin: 0.5 mg/dL (ref 0.0–1.2)
Total Protein: 7.4 g/dL (ref 6.5–8.1)

## 2023-12-11 LAB — CBC
HCT: 44.2 % (ref 36.0–46.0)
Hemoglobin: 14.9 g/dL (ref 12.0–15.0)
MCH: 28 pg (ref 26.0–34.0)
MCHC: 33.7 g/dL (ref 30.0–36.0)
MCV: 83.1 fL (ref 80.0–100.0)
Platelets: 452 K/uL — ABNORMAL HIGH (ref 150–400)
RBC: 5.32 MIL/uL — ABNORMAL HIGH (ref 3.87–5.11)
RDW: 14.1 % (ref 11.5–15.5)
WBC: 13.2 K/uL — ABNORMAL HIGH (ref 4.0–10.5)
nRBC: 0 % (ref 0.0–0.2)

## 2023-12-11 LAB — LIPASE, BLOOD: Lipase: 35 U/L (ref 11–51)

## 2023-12-11 LAB — HCG, SERUM, QUALITATIVE: Preg, Serum: NEGATIVE

## 2023-12-11 MED ORDER — DOCUSATE SODIUM 100 MG PO CAPS: 100.0000 mg | ORAL_CAPSULE | Freq: Two times a day (BID) | ORAL | 0 refills | Status: AC

## 2023-12-11 MED ORDER — OXYCODONE-ACETAMINOPHEN 5-325 MG PO TABS
1.0000 | ORAL_TABLET | Freq: Once | ORAL | Status: AC
  Administered 2023-12-11: 1 via ORAL

## 2023-12-11 MED ORDER — OXYCODONE-ACETAMINOPHEN 5-325 MG PO TABS
ORAL_TABLET | ORAL | Status: AC
Start: 2023-12-11 — End: 2023-12-11
  Filled 2023-12-11: qty 1

## 2023-12-11 MED ORDER — IOHEXOL 350 MG/ML SOLN
75.0000 mL | Freq: Once | INTRAVENOUS | Status: AC | PRN
  Administered 2023-12-11: 75 mL via INTRAVENOUS

## 2023-12-11 NOTE — ED Provider Triage Note (Signed)
 Emergency Medicine Provider Triage Evaluation Note  Kristin Kemp , a 33 y.o. female  was evaluated in triage.  Pt complains of abdominal pain, rectal discomfort, onset mid July. Seen in end of July, + ectopic, right tube removed 11/27/23. Continues to have abdominal discomfort. Painful knot at umbilical incision. NO fever/vomiting/changes in bowel or bladder.  Review of Systems  Positive:  Negative:   Physical Exam  BP (!) 138/96 (BP Location: Right Arm)   Pulse (!) 104   Temp 98.2 F (36.8 C) (Oral)   Resp 18   Ht 5' 3 (1.6 m)   Wt 75.3 kg   LMP 10/27/2023 (Approximate)   SpO2 98%   BMI 29.41 kg/m  Gen:   Awake, no distress   Resp:  Normal effort  MSK:   Moves extremities without difficulty  Other:  Mild generalized abdominal discomfort, palpable induration/mass at umbilical incision without fluctuance or erythema, does not reduce with gentle pressure  Medical Decision Making  Medically screening exam initiated at 3:28 AM.  Appropriate orders placed.  [name] was informed that the remainder of the evaluation will be completed by another provider, this initial triage assessment does not replace that evaluation, and the importance of remaining in the ED until their evaluation is complete.     Beverley Leita LABOR, PA-C 12/11/23 0330

## 2023-12-11 NOTE — ED Provider Notes (Signed)
 [place] [facility] AT [facility] Provider Note   CSN: [id number] Arrival date & time: 12/11/23  0137     Patient presents with: chief complaint - abdominal pain, post op pain  Kristin Kemp is a 33 y.o. female.   The history is provided by the patient.  Patient reports she has had intermittent abdominal pain for several months. At times it is around her umbilicus, other times it is in her upper abdomen and associated with food Over the past 24 hours her pain has been worsening.  No fevers or vomiting.  She does report constipation.  No vaginal bleeding or discharge.  No dysuria   Earlier this month she underwent surgery for an ectopic pregnancy and since that time her pain has appeared to worsen   Past Medical History:  Diagnosis Date   Abnormal Pap smear    Allergy    Anemia    Depression    Herpes    Late prenatal care    Ovarian cyst    PID (pelvic inflammatory disease)    Urinary tract infection     Prior to Admission medications   Medication Sig Start Date End Date Taking? Authorizing Provider  docusate sodium  (COLACE) 100 MG capsule Take 1 capsule (100 mg total) by mouth every 12 (twelve) hours. 12/11/23  Yes Midge Golas, MD  Multiple Vitamin (MULTIVITAMIN) LIQD Take 5 mLs by mouth daily.    [provider]  triamcinolone  0.1%-Cetaphil equivalent 1:4 cream mixture Apply topically 3 (three) times daily as needed. 12/01/23   Celestia Rosaline SQUIBB, NP    Allergies: Chocolate, Cinnamon, and Latex    Review of Systems  Constitutional:  Negative for fever.  Gastrointestinal:  Positive for constipation. Negative for vomiting.    Updated Vital Signs BP (!) 138/96 (BP Location: Right Arm)   Pulse (!) 104   Temp 98.2 F (36.8 C) (Oral)   Resp 18   Ht 1.6 m (5' 3)   Wt 75.3 kg   LMP 10/27/2023 (Approximate)   SpO2 98%   BMI 29.41 kg/m   Physical Exam CONSTITUTIONAL: Well developed/well nourished, resting comfortably HEAD:  Normocephalic/atraumatic ENMT: Mucous membranes moist NECK: supple no meningeal signs CV: S1/S2 noted, no murmurs/rubs/gallops noted LUNGS: Lungs are clear to auscultation bilaterally, no apparent distress ABDOMEN: soft, tenderness noted around the umbilicus otherwise no other tenderness noted, no rebound or guarding, bowel sounds noted throughout abdomen Surgical sites appear to be healing well NEURO: Pt is awake/alert/appropriate, moves all extremitiesx4.  No facial droop.    (all labs ordered are listed, but only abnormal results are displayed) Labs Reviewed  COMPREHENSIVE METABOLIC PANEL WITH GFR - Abnormal; Notable for the following components:      Result Value   CO2 21 (*)    Glucose, Bld 114 (*)    All other components within normal limits  CBC - Abnormal; Notable for the following components:   WBC 13.2 (*)    RBC 5.32 (*)    Platelets 452 (*)    All other components within normal limits  URINALYSIS, ROUTINE W REFLEX MICROSCOPIC - Abnormal; Notable for the following components:   Ketones, ur 5 (*)    All other components within normal limits  LIPASE, BLOOD  HCG, SERUM, QUALITATIVE    EKG: None  Radiology: CT ABDOMEN PELVIS W CONTRAST Result Date: 12/11/2023 EXAM: CT ABDOMEN AND PELVIS WITH CONTRAST 12/11/2023 04:08:37 AM TECHNIQUE: CT of the abdomen and pelvis was performed with the administration of intravenous  contrast. Multiplanar reformatted images are provided for review. Automated exposure control, iterative reconstruction, and/or weight based adjustment of the mA/kV was utilized to reduce the radiation dose to as low as reasonably achievable. COMPARISON: CT of the abdomen and pelvis 12/04/2008. Pelvic ultrasound 11/27/2023. CLINICAL HISTORY: Abdominal pain, acute, nonlocalized. The patient had an ectopic pregnancy 2 weeks ago and had surgery to remove it. 3 days after the surgery, her abdomen began to hurt and is still hurting. FINDINGS: LOWER CHEST: No acute  abnormality. LIVER: The liver is unremarkable. GALLBLADDER AND BILE DUCTS: Gallbladder is unremarkable. No biliary ductal dilatation. SPLEEN: No acute abnormality. PANCREAS: No acute abnormality. ADRENAL GLANDS: No acute abnormality. KIDNEYS, URETERS AND BLADDER: No stones in the kidneys or ureters. No hydronephrosis. No perinephric or periureteral stranding. Urinary bladder is unremarkable. GI AND BOWEL: A 10 mm paraumbilical hernia contains fat without bowel. There is some stranding of the fat. Stomach demonstrates no acute abnormality. There is no bowel obstruction. No bowel wall thickening. PERITONEUM AND RETROPERITONEUM: Moderate free fluid is present within the anatomic pelvis, likely within normal limits following recent surgery. No discrete collection is present to suggest abscess. No free air. VASCULATURE: Aorta is normal in caliber. LYMPH NODES: No lymphadenopathy. REPRODUCTIVE ORGANS: Edematous changes are present within the uterus, likely physiologic. BONES AND SOFT TISSUES: No acute osseous abnormality. No focal soft tissue abnormality. IMPRESSION: 1. No acute findings. 2. 10 mm periumbilical para-umbilical hernia containing fat without bowel, with some stranding of the fat. This may be related to the surgery and could be a source of pain. 3. Edematous changes within the uterus, likely physiologic. 4. Moderate free fluid within the anatomic pelvis, likely within normal limits following recent surgery. No discrete collection to suggest abscess. Electronically signed by: Lonni Necessary MD 12/11/2023 04:21 AM EDT RP Workstation: [id number]     Procedures   Medications Ordered in the ED  oxyCODONE -acetaminophen  (PERCOCET/ROXICET) 5-325 MG per tablet 1 tablet (1 tablet Oral Given 12/11/23 0215)  iohexol  (OMNIPAQUE ) 350 MG/ML injection 75 mL (75 mLs Intravenous Contrast Given 12/11/23 0408)                                    Medical Decision Making Amount and/or Complexity of Data  Reviewed Labs: ordered.  Risk OTC drugs. Prescription drug management.   This patient presents to the ED for concern of abdominal pain, this involves an extensive number of treatment options, and is a complaint that carries with it a high risk of complications and morbidity.  The differential diagnosis includes but is not limited to cholecystitis, cholelithiasis, pancreatitis, gastritis, peptic ulcer disease, appendicitis, bowel obstruction, bowel perforation, diverticulitis, AAA, ischemic bowel, postoperative complication   Comorbidities that complicate the patient evaluation: Patient's presentation is complicated by their history of previous ectopic pregnancy  Additional history obtained: Records reviewed previous admission documents  Lab Tests: I Ordered, and personally interpreted labs.  The pertinent results include: Leukocytosis, dehydration  Imaging Studies ordered: I ordered imaging studies including CT scan abdomen pelvis  I independently visualized and interpreted imaging which showed postoperative findings, umbilical hernia with fat noted I agree with the radiologist interpretation  Reevaluation: After the interventions noted above, I reevaluated the patient and found that they have :improved  Complexity of problems addressed: Patient's presentation is most consistent with  acute presentation with potential threat to life or bodily function  Disposition: After consideration of the diagnostic results and the patient's  response to treatment,  I feel that the patent would benefit from discharge  .   Patient just recently underwent R salpingectomy for ectopic pregnancy earlier this month. Since that time her pain is worsened.  CT imaging reveals postoperative free fluid without abscess as well as periumbilical hernia without bowel Overall patient is well-appearing. She suspect the constipation may be contributing to her symptoms.  She will be started on Colace, increase  fluids, follow-up with her gynecologist.  No indication for admission or emergent management at this time  patient agreeable with plan      Final diagnoses:  Post-operative pain  Umbilical hernia without obstruction and without gangrene    ED Discharge Orders          Ordered    docusate sodium  (COLACE) 100 MG capsule  Every 12 hours        12/11/23 0530               Midge Golas, MD 12/11/23 [phone]

## 2023-12-11 NOTE — ED Triage Notes (Signed)
 The pt had an ectopic  pregnancy 2 weeks ago and had surgery to remove it  3 days after the surgery her abd began to hurt and is still hurting  no appetite no temp vaginal bleeding  until this past Tuesday  her pain med is not helping her pain  she feel like there is a lump  in her belly button

## 2023-12-28 ENCOUNTER — Ambulatory Visit: Payer: Self-pay | Admitting: General Surgery

## 2023-12-28 DIAGNOSIS — K432 Incisional hernia without obstruction or gangrene: Secondary | ICD-10-CM | POA: Diagnosis not present

## 2024-02-01 ENCOUNTER — Emergency Department (HOSPITAL_BASED_OUTPATIENT_CLINIC_OR_DEPARTMENT_OTHER)
Admission: EM | Admit: 2024-02-01 | Discharge: 2024-02-01 | Disposition: A | Discharge status: Home / Self Care | Attending: Emergency Medicine | Admitting: Emergency Medicine

## 2024-02-01 ENCOUNTER — Other Ambulatory Visit: Payer: Self-pay

## 2024-02-01 DIAGNOSIS — R9431 Abnormal electrocardiogram [ECG] [EKG]: Secondary | ICD-10-CM | POA: Insufficient documentation

## 2024-02-01 DIAGNOSIS — R42 Dizziness and giddiness: Secondary | ICD-10-CM | POA: Insufficient documentation

## 2024-02-01 DIAGNOSIS — Z91040 Latex allergy status: Secondary | ICD-10-CM | POA: Diagnosis not present

## 2024-02-01 DIAGNOSIS — R1110 Vomiting, unspecified: Secondary | ICD-10-CM | POA: Diagnosis not present

## 2024-02-01 LAB — URINALYSIS, ROUTINE W REFLEX MICROSCOPIC
Bilirubin Urine: NEGATIVE
Glucose, UA: NEGATIVE mg/dL
Hgb urine dipstick: NEGATIVE
Ketones, ur: NEGATIVE mg/dL
Leukocytes,Ua: NEGATIVE
Nitrite: NEGATIVE
Protein, ur: NEGATIVE mg/dL
Specific Gravity, Urine: 1.014 (ref 1.005–1.030)
pH: 7 (ref 5.0–8.0)

## 2024-02-01 LAB — COMPREHENSIVE METABOLIC PANEL WITH GFR
ALT: 37 U/L (ref 0–44)
AST: 32 U/L (ref 15–41)
Albumin: 4.4 g/dL (ref 3.5–5.0)
Alkaline Phosphatase: 64 U/L (ref 38–126)
Anion gap: 14 (ref 5–15)
BUN: 6 mg/dL (ref 6–20)
CO2: 20 mmol/L — ABNORMAL LOW (ref 22–32)
Calcium: 9.8 mg/dL (ref 8.9–10.3)
Chloride: 104 mmol/L (ref 98–111)
Creatinine, Ser: 0.76 mg/dL (ref 0.44–1.00)
GFR, Estimated: >60 mL/min (ref >60)
Glucose, Bld: 120 mg/dL — ABNORMAL HIGH (ref 70–99)
Potassium: 3.7 mmol/L (ref 3.5–5.1)
Sodium: 139 mmol/L (ref 135–145)
Total Bilirubin: 0.3 mg/dL (ref 0.0–1.2)
Total Protein: 6.8 g/dL (ref 6.5–8.1)

## 2024-02-01 LAB — CBC
HCT: 38 % (ref 36.0–46.0)
Hemoglobin: 13.2 g/dL (ref 12.0–15.0)
MCH: 28.4 pg (ref 26.0–34.0)
MCHC: 34.7 g/dL (ref 30.0–36.0)
MCV: 81.9 fL (ref 80.0–100.0)
Platelets: 343 K/uL (ref 150–400)
RBC: 4.64 MIL/uL (ref 3.87–5.11)
RDW: 14 % (ref 11.5–15.5)
WBC: 7.5 K/uL (ref 4.0–10.5)
nRBC: 0 % (ref 0.0–0.2)

## 2024-02-01 LAB — HCG, SERUM, QUALITATIVE: Preg, Serum: NEGATIVE

## 2024-02-01 LAB — LIPASE, BLOOD: Lipase: 48 U/L (ref 11–51)

## 2024-02-01 LAB — TSH: TSH: 1.4 u[IU]/mL (ref 0.350–4.500)

## 2024-02-01 MED ORDER — MECLIZINE HCL 25 MG PO TABS: 25.0000 mg | ORAL_TABLET | Freq: Three times a day (TID) | ORAL | 0 refills | Status: AC | PRN

## 2024-02-01 MED ORDER — SODIUM CHLORIDE 0.9 % IV BOLUS
1000.0000 mL | Freq: Once | INTRAVENOUS | Status: AC
  Administered 2024-02-01: 1000 mL via INTRAVENOUS

## 2024-02-01 MED ORDER — MECLIZINE HCL 25 MG PO TABS
25.0000 mg | ORAL_TABLET | Freq: Once | ORAL | Status: AC
  Administered 2024-02-01: 25 mg via ORAL
  Filled 2024-02-01: qty 1

## 2024-02-01 MED ORDER — ONDANSETRON HCL 4 MG/2ML IJ SOLN
4.0000 mg | Freq: Once | INTRAMUSCULAR | Status: AC
Start: 2024-02-01 — End: 2024-02-01
  Administered 2024-02-01: 4 mg via INTRAVENOUS
  Filled 2024-02-01: qty 2

## 2024-02-01 NOTE — Discharge Instructions (Addendum)
 While you were in the emergency room, you had blood work done that overall was normal.  There sent your prescription for a medicine called meclizine.  This can help with dizziness symptoms.  Please call your primary care doctor within 1 week for follow-up appointment.  Return to the emergency room for new or worsening symptoms.

## 2024-02-01 NOTE — ED Provider Notes (Signed)
 [place] EMERGENCY DEPARTMENT AT [facility] Provider Note   CSN: [id number] Arrival date & time: 02/01/24  1916     Patient presents with: Dizziness and Emesis   Kristin Kemp is a 33 y.o. female.   This is a 33 year old female who is here today for intermittent dizziness, nausea that occurs with standing.  She has not had any vomiting, but has felt lightheaded and unwell.  She has not had any fever, chills, cough, abdominal pain, dysuria.  Her last menstrual cycle was 1 month ago.   Dizziness Associated symptoms: vomiting   Emesis      Prior to Admission medications   Medication Sig Start Date End Date Taking? Authorizing Provider  meclizine (ANTIVERT) 25 MG tablet Take 1 tablet (25 mg total) by mouth 3 (three) times daily as needed for dizziness. 02/01/24  Yes Mannie Pac T, DO  docusate sodium  (COLACE) 100 MG capsule Take 1 capsule (100 mg total) by mouth every 12 (twelve) hours. 12/11/23   Midge Golas, MD  Multiple Vitamin (MULTIVITAMIN) LIQD Take 5 mLs by mouth daily.    [provider]  triamcinolone  0.1%-Cetaphil equivalent 1:4 cream mixture Apply topically 3 (three) times daily as needed. 12/01/23   Celestia Rosaline SQUIBB, NP    Allergies: Chocolate, Cinnamon, and Latex    Review of Systems  Gastrointestinal:  Positive for vomiting.  Neurological:  Positive for dizziness.    Updated Vital Signs BP 120/84   Pulse 69   Temp (!) 97.5 F (36.4 C)   Resp 18   Ht 5' 3 (1.6 m)   Wt 70.3 kg   LMP 10/27/2023 (Approximate)   SpO2 100%   BMI 27.46 kg/m   Physical Exam Vitals reviewed.  HENT:     Head: Normocephalic.  Eyes:     Pupils: Pupils are equal, round, and reactive to light.  Cardiovascular:     Rate and Rhythm: Normal rate.  Pulmonary:     Effort: Pulmonary effort is normal.  Abdominal:     General: Abdomen is flat. There is no distension.     Palpations: Abdomen is soft.     Tenderness: There is no abdominal tenderness.   Musculoskeletal:        General: Normal range of motion.     Cervical back: Normal range of motion.  Skin:    General: Skin is warm.  Neurological:     General: No focal deficit present.     Mental Status: She is alert.     Cranial Nerves: No cranial nerve deficit.     Motor: No weakness.     Gait: Gait normal.     (all labs ordered are listed, but only abnormal results are displayed) Labs Reviewed  COMPREHENSIVE METABOLIC PANEL WITH GFR - Abnormal; Notable for the following components:      Result Value   CO2 20 (*)    Glucose, Bld 120 (*)    All other components within normal limits  LIPASE, BLOOD  CBC  URINALYSIS, ROUTINE W REFLEX MICROSCOPIC  TSH  HCG, SERUM, QUALITATIVE    EKG: EKG Interpretation Date/Time:  Wednesday February 01 2024 19:25:21 EDT Ventricular Rate:  79 PR Interval:  132 QRS Duration:  92 QT Interval:  362 QTC Calculation: 415 R Axis:   81  Text Interpretation: Normal sinus rhythm Possible Anterior infarct , age undetermined Abnormal ECG When compared with ECG of 01-Feb-2023 11:36, PREVIOUS ECG IS PRESENT Confirmed by Mannie Pac [phone]) on 02/01/2024 9:04:59 PM  Radiology: No results found.   Procedures   Medications Ordered in the ED  sodium chloride  0.9 % bolus 1,000 mL (1,000 mLs Intravenous New Bag/Given 02/01/24 2042)  ondansetron  (ZOFRAN ) injection 4 mg (4 mg Intravenous Given 02/01/24 2041)  meclizine (ANTIVERT) tablet 25 mg (25 mg Oral Given 02/01/24 2033)                                    Medical Decision Making 33 year old female here today for intermittent dizziness and nausea.  Differential diagnoses include orthostasis, anemia, less likely ACS, less likely CVA, less likely peripheral vertigo.  Plan-patient young, otherwise healthy.  Symptoms sound bit like some orthostasis.  She reports trying to drink more fluids over the last several days without improvement.  Will check patient's blood work, EKG, urinalysis.  Patient  denies any drug or alcohol use, not currently taking any medications.  Less likely to be a central process.  No risk factors, normal neurological exam.  Of note patient did have an ectopic pregnancy in August.  Reassessment 9:30 PM-patient's blood work shows no anemia, normal renal function, negative pregnancy, normal urinalysis.  Feeling a bit better after some meclizine.  Patient ambulatory in the ED.  Discussed return precautions with the patient at bedside.  At this time, does not appear to have an emergent process which would require further observation or admitting.  She is appropriate for outpatient management.  Amount and/or Complexity of Data Reviewed Labs: ordered.  Risk Prescription drug management.        Final diagnoses:  Vertigo    ED Discharge Orders          Ordered    meclizine (ANTIVERT) 25 MG tablet  3 times daily PRN        02/01/24 2131               Mannie Pac T, DO 02/01/24 2132

## 2024-02-01 NOTE — ED Triage Notes (Signed)
 Pt POV reporting intermittent dizziness and nausea upon standing since yesterday and one episode of emesis yesterday.

## 2024-05-11 ENCOUNTER — Telehealth: Payer: Self-pay

## 2024-05-11 DIAGNOSIS — H8110 Benign paroxysmal vertigo, unspecified ear: Secondary | ICD-10-CM

## 2024-05-11 NOTE — Progress Notes (Signed)
 Complex Care Management Note  Care Guide Note 05/11/2024 Name: CHANEKA TREFZ MRN: [id number] DOB: 1990-09-14  [name] is a 34 y.o. year old female who sees Celestia Rosaline SQUIBB, NP for primary care. I reached out to [name] by phone today to offer complex care management services.  Ms. Sanagustin was given information about Complex Care Management services today including:   The Complex Care Management services include support from the care team which includes your Nurse Care Manager, Clinical Social Worker, or Pharmacist.  The Complex Care Management team is here to help remove barriers to the health concerns and goals most important to you. Complex Care Management services are voluntary, and the patient may decline or stop services at any time by request to their care team member.   Complex Care Management Consent Status: Patient agreed to services and verbal consent obtained.   Follow up plan:  Telephone appointment with complex care management team member scheduled for:  05/28/24 at 1:00 p.m.   Encounter Outcome:  Patient Scheduled  Dreama [facility]  [facility], [facility] VBCI Assistant Direct Dial: [phone]  Fax: [phone]

## 2024-05-28 ENCOUNTER — Other Ambulatory Visit: Payer: Self-pay | Admitting: *Deleted

## 2024-05-28 ENCOUNTER — Other Ambulatory Visit: Payer: Self-pay

## 2024-05-28 NOTE — Patient Instructions (Signed)
 Visit Information  Kristin Kemp was given information about Medicaid Managed Care team care coordination services as a part of their [employer] Medicaid benefit. Kristin Kemp   If you would like to schedule transportation through your Healthy [facility] plan, please call the following number at least 2 days in advance of your appointment: [phone]  For information about your ride after you set it up, call Ride Assist at [phone]. Use this number to activate a Will Call pickup, or if your transportation is late for a scheduled pickup. Use this number, too, if you need to make a change or cancel a previously scheduled reservation.  If you need transportation services right away, call [phone]. The after-hours call center is staffed 24 hours to handle ride assistance and urgent reservation requests (including discharges) 365 days a year. Urgent trips include sick visits, hospital discharge requests and life-sustaining treatment.  Call the [facility] Line at [phone], at any time, 24 hours a day, 7 days a week. If you are in danger or need immediate medical attention call 911.   Please see education materials related to anemia provided by MyChart link.  Care plan and visit instructions communicated with the patient verbally today. Patient agrees to receive a copy in MyChart. Active MyChart status and patient understanding of how to access instructions and care plan via MyChart confirmed with patient.     Telephone follow up appointment with Managed Medicaid care management team member scheduled for: 07/16/24 @ 9:30 am.   Kristin Gloeckner, RN, BSN, ACM RN Care Manager [facility] [phone]   Following is a copy of your plan of care:   Goals Addressed             This Visit's Progress    VBCI RN Care Plan       Problems:  Chronic Disease Management support and education needs related to Anemia  Goal: Over the next 90 days the Patient  will attend all scheduled medical appointments: PCP and Specialist as evidenced by keeping all schedule appointments.          continue to work with Medical Illustrator and/or Social Worker to address care management and care coordination needs related to Anemia as evidenced by adherence to care management team scheduled appointments     take all medications exactly as prescribed and will call provider for medication related questions as evidenced by compliance.     verbalize basic understanding of anemia disease process and self health management plan as evidenced by verbal explanation, recognize, monitor symptoms and life style changes.    Interventions:    Evaluation of current treatment plan related to anemia and patient's adherence to plan as established by provider. Provided education to patient and/or caregiver about advanced directives Discussed plans with patient for ongoing care management follow up and provided patient with direct contact information for care management team  Patient Self-Care Activities:  Attend all scheduled provider appointments Attend church or other social activities Call pharmacy for medication refills 3-7 days in advance of running out of medications Call provider office for new concerns or questions  Perform all self care activities independently  Perform IADL's (shopping, preparing meals, housekeeping, managing finances) independently Take medications as prescribed    Plan:  Telephone follow up appointment with care management team member scheduled for:  07/16/24 @ 9:30 am.             Visit Information  Kristin Kemp was given information about [employer]  Care team care coordination services as a part of their Healthy [facility] Medicaid benefit. Kristin Kemp   If you would like to schedule transportation through your Healthy [facility] plan, please call the following number at least 2 days in advance of your appointment: [phone]  For  information about your ride after you set it up, call Ride Assist at [phone]. Use this number to activate a Will Call pickup, or if your transportation is late for a scheduled pickup. Use this number, too, if you need to make a change or cancel a previously scheduled reservation.  If you need transportation services right away, call [phone]. The after-hours call center is staffed 24 hours to handle ride assistance and urgent reservation requests (including discharges) 365 days a year. Urgent trips include sick visits, hospital discharge requests and life-sustaining treatment.  Call the [facility] Line at [phone], at any time, 24 hours a day, 7 days a week. If you are in danger or need immediate medical attention call 911.   Please see education materials related to Anemia provided by MyChart link.  Care plan and visit instructions communicated with the patient verbally today. Patient agrees to receive a copy in MyChart. Active MyChart status and patient understanding of how to access instructions and care plan via MyChart confirmed with patient.     Telephone follow up appointment with Managed Medicaid care management team member scheduled for: 07/16/24 @ 9:30 am.  Kristin Nickolson, RN, BSN, ACM RN Care Manager [facility] [phone]   Following is a copy of your plan of care:   Goals Addressed             This Visit's Progress    VBCI RN Care Plan       Problems:  Chronic Disease Management support and education needs related to Anemia  Goal: Over the next 90 days the Patient will attend all scheduled medical appointments: PCP and Specialist as evidenced by keeping all schedule appointments.          continue to work with Medical Illustrator and/or Social Worker to address care management and care coordination needs related to Anemia as evidenced by adherence to care management team scheduled appointments     take all medications exactly as  prescribed and will call provider for medication related questions as evidenced by compliance.     verbalize basic understanding of anemia disease process and self health management plan as evidenced by verbal explanation, recognize, monitor symptoms and life style changes.    Interventions:    Evaluation of current treatment plan related to anemia and patient's adherence to plan as established by provider. Provided education to patient and/or caregiver about advanced directives Discussed plans with patient for ongoing care management follow up and provided patient with direct contact information for care management team  Patient Self-Care Activities:  Attend all scheduled provider appointments Attend church or other social activities Call pharmacy for medication refills 3-7 days in advance of running out of medications Call provider office for new concerns or questions  Perform all self care activities independently  Perform IADL's (shopping, preparing meals, housekeeping, managing finances) independently Take medications as prescribed    Plan:  Telephone follow up appointment with care management team member scheduled for:  07/16/24 @ 9:30 am.

## 2024-06-22 ENCOUNTER — Ambulatory Visit (INDEPENDENT_AMBULATORY_CARE_PROVIDER_SITE_OTHER): Payer: Self-pay | Admitting: Primary Care

## 2024-07-16 ENCOUNTER — Telehealth: Admitting: *Deleted
